# Patient Record
Sex: Male | Born: 1945 | Race: Black or African American | Hispanic: No | Marital: Married | Smoking: Former smoker
Health system: Southern US, Community
[De-identification: ages and names within clinical notes are randomized; demographics above are authoritative.]

## PROBLEM LIST (undated history)

## (undated) DIAGNOSIS — J700 Acute pulmonary manifestations due to radiation: Secondary | ICD-10-CM

## (undated) DIAGNOSIS — I10 Essential (primary) hypertension: Secondary | ICD-10-CM

## (undated) DIAGNOSIS — C3490 Malignant neoplasm of unspecified part of unspecified bronchus or lung: Secondary | ICD-10-CM

## (undated) DIAGNOSIS — I639 Cerebral infarction, unspecified: Secondary | ICD-10-CM

## (undated) HISTORY — DX: Acute pulmonary manifestations due to radiation: J70.0

## (undated) HISTORY — DX: Essential (primary) hypertension: I10

## (undated) HISTORY — PX: CYST EXCISION: SHX5701

---

## 2010-01-13 ENCOUNTER — Ambulatory Visit: Payer: Self-pay | Admitting: Family Medicine

## 2013-12-29 ENCOUNTER — Emergency Department: Payer: Self-pay

## 2013-12-29 LAB — CBC
HCT: 28.3 % — AB (ref 40.0–52.0)
HGB: 9.3 g/dL — AB (ref 13.0–18.0)
MCH: 36.7 pg — ABNORMAL HIGH (ref 26.0–34.0)
MCHC: 32.8 g/dL (ref 32.0–36.0)
MCV: 112 fL — ABNORMAL HIGH (ref 80–100)
PLATELETS: 184 10*3/uL (ref 150–440)
RBC: 2.53 10*6/uL — ABNORMAL LOW (ref 4.40–5.90)
RDW: 16.9 % — ABNORMAL HIGH (ref 11.5–14.5)
WBC: 5.4 10*3/uL (ref 3.8–10.6)

## 2013-12-29 LAB — URINALYSIS, COMPLETE
BACTERIA: NONE SEEN
Bilirubin,UR: NEGATIVE
Blood: NEGATIVE
Glucose,UR: NEGATIVE mg/dL (ref 0–75)
Ketone: NEGATIVE
Leukocyte Esterase: NEGATIVE
Nitrite: NEGATIVE
Ph: 5 (ref 4.5–8.0)
Protein: NEGATIVE
RBC,UR: NONE SEEN /HPF (ref 0–5)
SPECIFIC GRAVITY: 1.014 (ref 1.003–1.030)
Squamous Epithelial: <1
WBC UR: 1 /HPF (ref 0–5)

## 2013-12-29 LAB — BASIC METABOLIC PANEL
ANION GAP: 9 (ref 7–16)
BUN: 5 mg/dL — AB (ref 7–18)
CALCIUM: 8.1 mg/dL — AB (ref 8.5–10.1)
CO2: 20 mmol/L — AB (ref 21–32)
CREATININE: 0.98 mg/dL (ref 0.60–1.30)
Chloride: 109 mmol/L — ABNORMAL HIGH (ref 98–107)
Glucose: 109 mg/dL — ABNORMAL HIGH (ref 65–99)
OSMOLALITY: 274 (ref 275–301)
POTASSIUM: 3.7 mmol/L (ref 3.5–5.1)
Sodium: 138 mmol/L (ref 136–145)

## 2018-03-02 ENCOUNTER — Telehealth: Payer: Self-pay | Admitting: *Deleted

## 2018-03-02 DIAGNOSIS — Z87891 Personal history of nicotine dependence: Secondary | ICD-10-CM

## 2018-03-02 DIAGNOSIS — Z122 Encounter for screening for malignant neoplasm of respiratory organs: Secondary | ICD-10-CM

## 2018-03-02 NOTE — Telephone Encounter (Signed)
Received referral for initial lung cancer screening scan. Contacted patient and obtained smoking history,(current, 58 pack year) as well as answering questions related to screening process. Patient denies signs of lung cancer such as weight loss or hemoptysis. Patient denies comorbidity that would prevent curative treatment if lung cancer were found. Patient is scheduled for shared decision making visit and CT scan on 03/17/18 at 2:15pm.

## 2018-03-17 ENCOUNTER — Ambulatory Visit
Admission: RE | Admit: 2018-03-17 | Discharge: 2018-03-17 | Disposition: A | Discharge status: Home / Self Care | Payer: Medicare PPO | Source: Ambulatory Visit | Attending: Nurse Practitioner | Admitting: Nurse Practitioner

## 2018-03-17 ENCOUNTER — Inpatient Hospital Stay: Payer: Medicare PPO | Attending: Nurse Practitioner | Admitting: Nurse Practitioner

## 2018-03-17 ENCOUNTER — Encounter: Payer: Self-pay | Admitting: Nurse Practitioner

## 2018-03-17 DIAGNOSIS — J90 Pleural effusion, not elsewhere classified: Secondary | ICD-10-CM | POA: Insufficient documentation

## 2018-03-17 DIAGNOSIS — I2510 Atherosclerotic heart disease of native coronary artery without angina pectoris: Secondary | ICD-10-CM | POA: Insufficient documentation

## 2018-03-17 DIAGNOSIS — R918 Other nonspecific abnormal finding of lung field: Secondary | ICD-10-CM | POA: Insufficient documentation

## 2018-03-17 DIAGNOSIS — I700 Atherosclerosis of aorta: Secondary | ICD-10-CM | POA: Diagnosis not present

## 2018-03-17 DIAGNOSIS — C342 Malignant neoplasm of middle lobe, bronchus or lung: Secondary | ICD-10-CM | POA: Insufficient documentation

## 2018-03-17 DIAGNOSIS — Z87891 Personal history of nicotine dependence: Secondary | ICD-10-CM

## 2018-03-17 DIAGNOSIS — J479 Bronchiectasis, uncomplicated: Secondary | ICD-10-CM | POA: Diagnosis not present

## 2018-03-17 DIAGNOSIS — J439 Emphysema, unspecified: Secondary | ICD-10-CM | POA: Diagnosis not present

## 2018-03-17 DIAGNOSIS — F17210 Nicotine dependence, cigarettes, uncomplicated: Secondary | ICD-10-CM | POA: Insufficient documentation

## 2018-03-17 DIAGNOSIS — Z122 Encounter for screening for malignant neoplasm of respiratory organs: Secondary | ICD-10-CM

## 2018-03-17 DIAGNOSIS — I10 Essential (primary) hypertension: Secondary | ICD-10-CM | POA: Insufficient documentation

## 2018-03-17 DIAGNOSIS — R5383 Other fatigue: Secondary | ICD-10-CM | POA: Insufficient documentation

## 2018-03-17 DIAGNOSIS — C771 Secondary and unspecified malignant neoplasm of intrathoracic lymph nodes: Secondary | ICD-10-CM | POA: Insufficient documentation

## 2018-03-17 DIAGNOSIS — Z79899 Other long term (current) drug therapy: Secondary | ICD-10-CM | POA: Insufficient documentation

## 2018-03-17 NOTE — Progress Notes (Signed)
In accordance with CMS guidelines, patient has met eligibility criteria including age, absence of signs or symptoms of lung cancer.  Social History   Tobacco Use  . Smoking status: Current Some Day Smoker    Packs/day: 1.00    Years: 58.00    Pack years: 58.00    Types: Cigarettes  Substance Use Topics  . Alcohol use: Not on file  . Drug use: Not on file      A shared decision-making session was conducted prior to the performance of CT scan. This includes one or more decision aids, includes benefits and harms of screening, follow-up diagnostic testing, over-diagnosis, false positive rate, and total radiation exposure.   Counseling on the importance of adherence to annual lung cancer LDCT screening, impact of co-morbidities, and ability or willingness to undergo diagnosis and treatment is imperative for compliance of the program.   Counseling on the importance of continued smoking cessation for former smokers; the importance of smoking cessation for current smokers, and information about tobacco cessation interventions have been given to patient including [place] and [facility] quit [place] programs.   Written order for lung cancer screening with LDCT has been given to the patient and any and all questions have been answered to the best of my abilities.    Yearly follow up will be coordinated by Burgess Estelle, Thoracic Navigator.  Beckey Rutter, DNP, AGNP-C [place] at [facility] [phone] (work cell) [phone] (office) 03/17/18 3:48 PM

## 2018-03-18 ENCOUNTER — Encounter: Payer: Self-pay | Admitting: *Deleted

## 2018-03-18 ENCOUNTER — Encounter: Payer: Self-pay | Admitting: Oncology

## 2018-03-18 ENCOUNTER — Telehealth: Payer: Self-pay | Admitting: *Deleted

## 2018-03-18 DIAGNOSIS — R918 Other nonspecific abnormal finding of lung field: Secondary | ICD-10-CM

## 2018-03-18 NOTE — Progress Notes (Signed)
I will be seeing this patient on 03/25/2018 for evaluation of right middle lobe lung mass and hilar adenopathy noted on screening low-dose CT scan of the lung.  I have reviewed the images.  Patient will need a PET CT scan for further evaluation and decision regarding biopsy.  Patient will also benefit from pulmonary consultation for probable bronchoscopy.   Dr. Randa Evens, MD, MPH [place] at [facility] [phone] 03/18/2018 2:46 PM

## 2018-03-18 NOTE — Telephone Encounter (Signed)
After discussion with PCP office and thoracic nurse navigator, contacted patient's wife and reviewed results with plan for PET scan, pulmonary for biopsy, and medical oncology appointments. Wife verbalizes understanding.   IMPRESSION: 1. Lung-RADS 4B, suspicious. Additional imaging evaluation or consultation with Pulmonology or Thoracic Surgery recommended. 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 A component of the right middle lobe process is likely postobstructive pneumonitis. Consider tissue sampling via bronchoscopy versus PET to direct tissue sampling. 2. Aortic atherosclerosis (ICD10-I70.0), coronary artery atherosclerosis and emphysema (ICD10-J43.9). 3. Suspicion of interstitial lung disease, as evidenced by architectural distortion and traction bronchiectasis in the upper lobes, worse on the right. 4. Esophageal air fluid level suggests dysmotility or gastroesophageal reflux. 5. Small right pleural effusion.

## 2018-03-18 NOTE — Progress Notes (Signed)
  Oncology Nurse Navigator Documentation  Navigator Location: [facility]-Med Onc (03/18/18 1400) Referral date to RadOnc/MedOnc: 03/18/18 (03/18/18 1400) )Navigator Encounter Type: Other (03/18/18 1400)   Abnormal Finding Date: 03/18/18 (03/18/18 1400)                 Patient Visit Type: Other (03/18/18 1400) Treatment Phase: Abnormal Scans (03/18/18 1400) Barriers/Navigation Needs: Coordination of Care (03/18/18 1400)   Interventions: Coordination of Care (03/18/18 1400)   Coordination of Care: Appts;Radiology (03/18/18 1400)        Acuity: Level 2 (03/18/18 1400)   Acuity Level 2: Initial guidance, education and coordination as needed;Educational needs;Assistance expediting appointments (03/18/18 1400)  referral received from lung cancer screening program for lung mass with mediastinal/hilar nodal mets based on CT results. Spoke with Dr. Janese Banks who advised pt be set up with PET scan and to follow up with med-onc and pulmonary after PET scan. Pt has been made aware of CT results and will be contacted once all appts scheduled.    Time Spent with Patient: 30 (03/18/18 1400)

## 2018-03-22 ENCOUNTER — Telehealth: Payer: Self-pay | Admitting: *Deleted

## 2018-03-22 NOTE — Addendum Note (Signed)
Addended by: Telford Nab on: 03/22/2018 11:53 AM   Modules accepted: Orders

## 2018-03-22 NOTE — Telephone Encounter (Signed)
Spoke with patient's wife and reviewed all upcoming appts. Contact info given and instructed to call with any further questions.

## 2018-03-22 NOTE — Telephone Encounter (Signed)
Multiple attempts have been made to discuss scheduled appts for PET scan and consultation in lung clinic Friday morning. Multiple messages left for pt to return call to discuss appts. At this time, pt has not returned any phone call. Will continue to contact pt.

## 2018-03-23 ENCOUNTER — Encounter
Admission: RE | Admit: 2018-03-23 | Discharge: 2018-03-23 | Disposition: A | Discharge status: Home / Self Care | Payer: Medicare PPO | Source: Ambulatory Visit | Attending: Oncology | Admitting: Oncology

## 2018-03-23 DIAGNOSIS — R918 Other nonspecific abnormal finding of lung field: Secondary | ICD-10-CM | POA: Insufficient documentation

## 2018-03-23 LAB — GLUCOSE, CAPILLARY: GLUCOSE-CAPILLARY: 113 mg/dL — AB (ref 70–99)

## 2018-03-23 MED ORDER — FLUDEOXYGLUCOSE F - 18 (FDG) INJECTION
9.2300 | Freq: Once | INTRAVENOUS | Status: AC | PRN
  Administered 2018-03-23: 9.23 via INTRAVENOUS

## 2018-03-25 ENCOUNTER — Encounter: Payer: Self-pay | Admitting: *Deleted

## 2018-03-25 ENCOUNTER — Encounter: Payer: Self-pay | Admitting: Pulmonary Disease

## 2018-03-25 ENCOUNTER — Telehealth: Payer: Self-pay

## 2018-03-25 ENCOUNTER — Encounter: Payer: Self-pay | Admitting: Oncology

## 2018-03-25 ENCOUNTER — Ambulatory Visit (INDEPENDENT_AMBULATORY_CARE_PROVIDER_SITE_OTHER): Payer: Medicare PPO | Admitting: Pulmonary Disease

## 2018-03-25 ENCOUNTER — Inpatient Hospital Stay (HOSPITAL_BASED_OUTPATIENT_CLINIC_OR_DEPARTMENT_OTHER): Payer: Medicare PPO | Admitting: Oncology

## 2018-03-25 ENCOUNTER — Inpatient Hospital Stay: Payer: Medicare PPO

## 2018-03-25 VITALS — BP 126/77 | HR 96 | Temp 97.3°F | Resp 18 | Ht 72.0 in | Wt 155.2 lb

## 2018-03-25 DIAGNOSIS — R5383 Other fatigue: Secondary | ICD-10-CM

## 2018-03-25 DIAGNOSIS — I10 Essential (primary) hypertension: Secondary | ICD-10-CM | POA: Diagnosis not present

## 2018-03-25 DIAGNOSIS — F17210 Nicotine dependence, cigarettes, uncomplicated: Secondary | ICD-10-CM

## 2018-03-25 DIAGNOSIS — Z7189 Other specified counseling: Secondary | ICD-10-CM

## 2018-03-25 DIAGNOSIS — C771 Secondary and unspecified malignant neoplasm of intrathoracic lymph nodes: Secondary | ICD-10-CM | POA: Diagnosis not present

## 2018-03-25 DIAGNOSIS — Z79899 Other long term (current) drug therapy: Secondary | ICD-10-CM

## 2018-03-25 DIAGNOSIS — R590 Localized enlarged lymph nodes: Secondary | ICD-10-CM

## 2018-03-25 DIAGNOSIS — R918 Other nonspecific abnormal finding of lung field: Secondary | ICD-10-CM

## 2018-03-25 DIAGNOSIS — C342 Malignant neoplasm of middle lobe, bronchus or lung: Secondary | ICD-10-CM | POA: Diagnosis present

## 2018-03-25 LAB — CBC WITH DIFFERENTIAL/PLATELET
Abs Immature Granulocytes: 0.01 10*3/uL (ref 0.00–0.07)
BASOS ABS: 0 10*3/uL (ref 0.0–0.1)
Basophils Relative: 1 %
EOS PCT: 3 %
Eosinophils Absolute: 0.2 10*3/uL (ref 0.0–0.5)
HEMATOCRIT: 37.9 % — AB (ref 39.0–52.0)
HEMOGLOBIN: 12.4 g/dL — AB (ref 13.0–17.0)
Immature Granulocytes: 0 %
LYMPHS ABS: 1.8 10*3/uL (ref 0.7–4.0)
LYMPHS PCT: 34 %
MCH: 32.2 pg (ref 26.0–34.0)
MCHC: 32.7 g/dL (ref 30.0–36.0)
MCV: 98.4 fL (ref 80.0–100.0)
MONO ABS: 0.7 10*3/uL (ref 0.1–1.0)
MONOS PCT: 13 %
NRBC: 0 % (ref 0.0–0.2)
Neutro Abs: 2.6 10*3/uL (ref 1.7–7.7)
Neutrophils Relative %: 49 %
Platelets: 326 10*3/uL (ref 150–400)
RBC: 3.85 MIL/uL — ABNORMAL LOW (ref 4.22–5.81)
RDW: 13 % (ref 11.5–15.5)
WBC: 5.2 10*3/uL (ref 4.0–10.5)

## 2018-03-25 LAB — COMPREHENSIVE METABOLIC PANEL
ALBUMIN: 3.3 g/dL — AB (ref 3.5–5.0)
ALT: 11 U/L (ref 0–44)
AST: 18 U/L (ref 15–41)
Alkaline Phosphatase: 88 U/L (ref 38–126)
Anion gap: 8 (ref 5–15)
BUN: 10 mg/dL (ref 8–23)
CO2: 27 mmol/L (ref 22–32)
CREATININE: 0.89 mg/dL (ref 0.61–1.24)
Calcium: 9.6 mg/dL (ref 8.9–10.3)
Chloride: 100 mmol/L (ref 98–111)
GFR calc Af Amer: >60 mL/min (ref >60)
GFR calc non Af Amer: >60 mL/min (ref >60)
GLUCOSE: 99 mg/dL (ref 70–99)
Potassium: 4.3 mmol/L (ref 3.5–5.1)
SODIUM: 135 mmol/L (ref 135–145)
Total Bilirubin: 0.5 mg/dL (ref 0.3–1.2)
Total Protein: 8 g/dL (ref 6.5–8.1)

## 2018-03-25 LAB — PROTIME-INR
INR: 1.09
Prothrombin Time: 14 seconds (ref 11.4–15.2)

## 2018-03-25 LAB — APTT: aPTT: 31 seconds (ref 24–36)

## 2018-03-25 NOTE — Progress Notes (Signed)
The patient was seen at the cancer center during multidisciplinary Lung Cancer clinic.  Subjective:    Patient ID: Philip Weiss, male    DOB: 1945/09/07, 72 y.o.   MRN: [id number]  HPI The patient is a 72 year old current smoker, who presented to the cancer center today after having had an abnormal CT chest for lung cancer screening on 11 October. We are asked to evaluate the patient for potential bronchoscopy. The patient is kindly referred by Dr. Janese Banks. The patient does not endorse any symptoms of weight loss, anorexia, chest pain, or hemoptysis. He had had a nonproductive cough which prompted him to present to the [facility] for evaluation. After that visit, the patient had a low dose CT scan of the chest performed. This was for lung cancer screening. The chest CT has shown a right middle lobe mass and significant mediastinal and right hilar adenopathy as well as a dense sub coronal mass. These films were individually reviewed. I have also reviewed the films with the patient and his wife. The patient also had a PET/CT this was performed on 16 October. PET/CT reveals that the findings noted above have significant FDG uptake.  The patient has noted does not endorse any associated symptoms except for the nonproductive cough with regards to these findings.  We discussed workup of these findings and the patient understands the potential methods for biopsy. Percutaneous needle biopsy as well as bronchoscopy within the bronchial ultrasound were presented to the patient. The patient elected to go with bronchoscopy within the bronchial ultrasound. He understands the potential benefits, limitations and complications of this procedure to include pneumothorax, bleeding, infection to name a few. The patient also understands the procedure will be performed under general anesthesia.  The patient's past medical history, family history, social history and surgical history have been reviewed.  He has no military  background. He has worked in [employer]. He has since retired.  Review of Systems  Constitutional: Negative.   HENT: Negative.   Eyes: Negative.   Respiratory: Positive for cough (Non productive).   Cardiovascular: Negative.   Gastrointestinal: Negative.   Endocrine: Negative.   Genitourinary: Negative.   Musculoskeletal: Negative.   Skin: Negative.   Allergic/Immunologic: Negative.   Neurological: Negative.   Hematological: Negative.   Psychiatric/Behavioral: Negative.        Objective:   Physical Exam  Constitutional: He is oriented to person, place, and time. He appears well-developed and well-nourished. No distress.  HENT:  Head: Normocephalic and atraumatic.  Right Ear: External ear normal.  Left Ear: External ear normal.  Nose: Nose normal.  Mouth/Throat: Oropharynx is clear and moist.  Poor dentition, Mallampati Class I.  Eyes: Pupils are equal, round, and reactive to light. Conjunctivae and EOM are normal. No scleral icterus.  Neck: Normal range of motion. Neck supple. No JVD present. No tracheal deviation present. No thyromegaly present.  Cardiovascular: Normal rate, regular rhythm, normal heart sounds and intact distal pulses. Exam reveals no gallop and no friction rub.  No murmur heard. Pulmonary/Chest: Effort normal. No stridor. No respiratory distress. He has no wheezes. He has no rales. He exhibits no tenderness.  Coarse breath sounds  Abdominal: Soft. Bowel sounds are normal. He exhibits no distension.  Musculoskeletal: Normal range of motion. He exhibits no deformity.  Lymphadenopathy:    He has no cervical adenopathy.  Neurological: He is alert and oriented to person, place, and time.  No focal deficits noted  Skin: Skin is warm and dry. No  rash noted. No erythema. No pallor.  Psychiatric: He has a normal mood and affect. Thought content normal.  Nursing note and vitals reviewed.   Patient's imaging has been reviewed in detail. I have also reviewed the  images with the patient and his wife. The patient will need update on laboratory data particularly since he is to undergo general anesthesia.      Assessment & Plan:   1) Right lower lobe lung mass: carcinoma until proven otherwise. The patient will need definitive diagnosis. As can be achieved with bronchoscopy will biopsies of the right lower lobe under fluoroscopy. In addition, as noted below the patient has significant mediastinal adenopathy and this will be evaluated in the same setting as below.  2) Mediastinal adenopathy: the patient has bulking mediastinal adenopathy/mass, this is likely related to his primary process. This can be assessed at the time of bronchoscopy within the bronchial ultrasound and entrance bronchial needle aspirate (TBNA). The patient understands of the procedure will be performed under general anesthesia. He understands the potential benefits, limitations and complications of the procedure and agrees to go ahead.  3) Tobacco dependence due to cigarettes: the patient was counseled regards to discontinuation of smoking average33 minutes counseling time.   Patient has been scheduled for bronchoscopy with right lower lobe biopsy under fluoroscopy and in the bronchial ultrasound with mediastinal node/mass sampling for 22 October.    Thank you for allowing me to participate in this patient's care.

## 2018-03-25 NOTE — Telephone Encounter (Signed)
Surgical Procedure: Bronchoscopy and EBUS DX: Right lower lobe mass, subcarinal mass  CPT Code: 41740, 81448, 18563 Sx date: 03/29/18 @ 10:00 pending confirmation

## 2018-03-25 NOTE — Telephone Encounter (Signed)
Called [employer] and spoke with Lisabeth Devoid at 11:38 am EST. Procedure codes [phone], [phone] and [phone] does not require PA, valid and billable and no referral is needed. Call Ref # [id number]. Rhonda J Cobb

## 2018-03-25 NOTE — Patient Instructions (Signed)
Patient was provided instructions at his oncology visit.

## 2018-03-28 ENCOUNTER — Encounter
Admission: RE | Admit: 2018-03-28 | Discharge: 2018-03-28 | Disposition: A | Discharge status: Home / Self Care | Payer: Medicare PPO | Source: Ambulatory Visit | Attending: Pulmonary Disease | Admitting: Pulmonary Disease

## 2018-03-28 ENCOUNTER — Encounter: Payer: Self-pay | Admitting: Oncology

## 2018-03-28 ENCOUNTER — Other Ambulatory Visit: Payer: Self-pay

## 2018-03-28 DIAGNOSIS — I10 Essential (primary) hypertension: Secondary | ICD-10-CM | POA: Insufficient documentation

## 2018-03-28 DIAGNOSIS — I491 Atrial premature depolarization: Secondary | ICD-10-CM

## 2018-03-28 DIAGNOSIS — R918 Other nonspecific abnormal finding of lung field: Secondary | ICD-10-CM | POA: Insufficient documentation

## 2018-03-28 DIAGNOSIS — F17210 Nicotine dependence, cigarettes, uncomplicated: Secondary | ICD-10-CM | POA: Diagnosis not present

## 2018-03-28 DIAGNOSIS — R599 Enlarged lymph nodes, unspecified: Secondary | ICD-10-CM | POA: Diagnosis not present

## 2018-03-28 DIAGNOSIS — Z79899 Other long term (current) drug therapy: Secondary | ICD-10-CM | POA: Diagnosis not present

## 2018-03-28 DIAGNOSIS — C342 Malignant neoplasm of middle lobe, bronchus or lung: Secondary | ICD-10-CM | POA: Diagnosis not present

## 2018-03-28 DIAGNOSIS — C7989 Secondary malignant neoplasm of other specified sites: Secondary | ICD-10-CM | POA: Diagnosis not present

## 2018-03-28 DIAGNOSIS — Z01818 Encounter for other preprocedural examination: Secondary | ICD-10-CM

## 2018-03-28 NOTE — Progress Notes (Signed)
  Oncology Nurse Navigator Documentation  Navigator Location: [facility]-Med Onc (03/25/18 1100)   )Navigator Encounter Type: Clinic/MDC (03/25/18 1100)               Multidisiplinary Clinic Date: 03/25/18 (03/25/18 1100) Multidisiplinary Clinic Type: Thoracic (03/25/18 1100)   Patient Visit Type: MedOnc;Other (03/25/18 1100)   Barriers/Navigation Needs: Coordination of Care (03/25/18 1100)   Interventions: Coordination of Care (03/25/18 1100)   Coordination of Care: Appts;Broncoscopy;Radiology (03/25/18 1100)         met with patient and his wife during consultation in thoracic [place] this morning. All questions answered at the time of visit. Reviewed upcoming appts with patient. Informed that will be notified by phone when to follow up with Dr. Janese Banks and Dr. Baruch Gouty after bronchoscopy. Pt informed that will try to coordinate brain MRI on same day as well. Contact info given and instructed to call with any further questions or needs. Pt verbalized understanding.         Time Spent with Patient: 120 (03/25/18 1100)

## 2018-03-28 NOTE — Patient Instructions (Signed)
Your procedure is scheduled on: Tuesday, March 29, 2018 Report to Day Surgery on the 2nd floor of the [facility]. To find out your arrival time, please call [phone] between 1PM - 3PM on: today, October 21  REMEMBER: Instructions that are not followed completely may result in serious medical risk, up to and including death; or upon the discretion of your surgeon and anesthesiologist your surgery may need to be rescheduled.  Do not eat food after midnight the night before surgery.  No gum chewing, lozengers or hard candies.  You may however, drink CLEAR liquids up to 2 hours before you are scheduled to arrive for your surgery. Do not drink anything within 2 hours of the start of your surgery.  Clear liquids include: - water  - apple juice without pulp - gatorade - black coffee or tea (Do NOT add milk or creamers to the coffee or tea) Do NOT drink anything that is not on this list.  No Alcohol for 24 hours before or after surgery.  No Smoking including e-cigarettes for 24 hours prior to surgery.  No chewable tobacco products for at least 6 hours prior to surgery.  No nicotine patches on the day of surgery.  On the morning of surgery brush your teeth with toothpaste and water, you may rinse your mouth with mouthwash if you wish. Do not swallow any toothpaste or mouthwash.  Notify your doctor if there is any change in your medical condition (cold, fever, infection).  Do not wear jewelry, make-up, hairpins, clips or nail polish.  Do not wear lotions, powders, or perfumes.   Do not shave 48 hours prior to surgery. Men may shave face and neck.  Contacts and dentures may not be worn into surgery.  Do not bring valuables to the hospital, including drivers license, insurance or credit cards.  [facility] is not responsible for any belongings or valuables.   TAKE THESE MEDICATIONS THE MORNING OF SURGERY:  1.  symbicort inhaler  NOW!  Stop Anti-inflammatories (NSAIDS) such  as Advil, Aleve, Ibuprofen, Motrin, Naproxen, Naprosyn and Aspirin based products such as Excedrin, Goodys Powder, BC Powder. (May take Tylenol or Acetaminophen if needed.)  NOW!  Stop ANY OVER THE COUNTER supplements until after surgery. (May continue Vitamin B, and multivitamin.)  If you are being discharged the day of surgery, you will not be allowed to drive home. You will need a responsible adult to drive you home and stay with you that night.   If you are taking public transportation, you will need to have a responsible adult with you. Please confirm with your physician that it is acceptable to use public transportation.   Please call [phone] if you have any questions about these instructions.

## 2018-03-28 NOTE — Progress Notes (Signed)
Hematology/Oncology Consult note [facility] Telephone:([phone] [phone]  Patient Care Team: [facility] as PCP - General ([place]) Telford Nab, RN as Registered Nurse   Name of the patient: Philip Weiss  [id number]  14-Mar-1946    Reason for consult; new lung mass   Requesting physician: [facility]  Date of visit: 03/28/18   History of presenting illness-patient is a 72 year old male with a long-standing history of smoking and is a current smoker with a 58-pack-year smoking history.  He underwent lung cancer screening low-dose CT scan which showed a right middle lobe lung mass of about 4.8 cm.  Subcarinal adenopathy 2.8 x 4 cm and probable right hilar adenopathy and soft tissue fullness 2.3 cm.  This was followed by a PET CT scan which showed right middle lobe mass was hypermetabolic at 02.6 with hypermetabolic right hilar and infrahilar adenopathy.  Hypermetabolic 2.5 cm subcarinal node and right paratracheal nodes.  No hypermetabolic contralateral adenopathy.  Left upper lobe 4 mm solitary pulmonary nodule below PET resolution.  No evidence of hypermetabolic them elsewhere.  Patient lives at home with his wife in [place].  He reports feeling fatigued but denies any other specific complaints.  His appetite is good and he denies any unintentional weight loss.  Denies any aches or pains anywhere.  He does have some chronic nonproductive cough.  ECOG PS- 1  Pain scale- 0   Review of systems- Review of Systems  Constitutional: Negative for chills, fever, malaise/fatigue and weight loss.  HENT: Negative for congestion, ear discharge and nosebleeds.   Eyes: Negative for blurred vision.  Respiratory: Positive for cough. Negative for hemoptysis, sputum production, shortness of breath and wheezing.   Cardiovascular: Negative for chest pain, palpitations, orthopnea and claudication.  Gastrointestinal:  Negative for abdominal pain, blood in stool, constipation, diarrhea, heartburn, melena, nausea and vomiting.  Genitourinary: Negative for dysuria, flank pain, frequency, hematuria and urgency.  Musculoskeletal: Negative for back pain, joint pain and myalgias.  Skin: Negative for rash.  Neurological: Negative for dizziness, tingling, focal weakness, seizures, weakness and headaches.  Endo/Heme/Allergies: Does not bruise/bleed easily.  Psychiatric/Behavioral: Negative for depression and suicidal ideas. The patient does not have insomnia.     No Known Allergies  There are no active problems to display for this patient.    Past Medical History:  Diagnosis Date  . Hypertension      Past Surgical History:  Procedure Laterality Date  . CYST EXCISION Left    buttock    Social History   Socioeconomic History  . Marital status: Married    Spouse name: Not on file  . Number of children: Not on file  . Years of education: Not on file  . Highest education level: Not on file  Occupational History  . Not on file  Social Needs  . Financial resource strain: Not on file  . Food insecurity:    Worry: Not on file    Inability: Not on file  . Transportation needs:    Medical: Not on file    Non-medical: Not on file  Tobacco Use  . Smoking status: Current Every Day Smoker    Packs/day: 1.00    Years: 58.00    Pack years: 58.00    Types: Cigarettes  . Smokeless tobacco: Never Used  Substance and Sexual Activity  . Alcohol use: Yes    Comment: daily 32 oz beer  . Drug use: Never  . Sexual activity: Not  on file  Lifestyle  . Physical activity:    Days per week: Not on file    Minutes per session: Not on file  . Stress: Not on file  Relationships  . Social connections:    Talks on phone: Not on file    Gets together: Not on file    Attends religious service: Not on file    Active member of club or organization: Not on file    Attends meetings of clubs or organizations: Not  on file    Relationship status: Not on file  . Intimate partner violence:    Fear of current or ex partner: Not on file    Emotionally abused: Not on file    Physically abused: Not on file    Forced sexual activity: Not on file  Other Topics Concern  . Not on file  Social History Narrative  . Not on file     Family History  Problem Relation Age of Onset  . Diabetes Mother   . Stroke Father      Current Outpatient Medications:  .  budesonide-formoterol (SYMBICORT) 160-4.5 MCG/ACT inhaler, Inhale 2 puffs into the lungs 2 (two) times daily., Disp: , Rfl:  .  Cyanocobalamin (VITAMIN B-12 PO), Take 1 tablet by mouth daily., Disp: , Rfl:  .  losartan (COZAAR) 25 MG tablet, Take 25 mg by mouth daily. , Disp: , Rfl:  .  Multiple Vitamins-Minerals (MULTIVITAMIN PO), Take 1 tablet by mouth daily., Disp: , Rfl:  .  Pyridoxine HCl (VITAMIN B-6 PO), Take 1 tablet by mouth daily., Disp: , Rfl:    Physical exam:  Vitals:   03/25/18 0859  BP: 126/77  Pulse: 96  Resp: 18  Temp: (!) 97.3 F (36.3 C)  TempSrc: Tympanic  SpO2: 97%  Weight: 155 lb 3.2 oz (70.4 kg)  Height: 6' (1.829 m)   Physical Exam  Constitutional: He is oriented to person, place, and time.  Thin gentleman in no acute distress  HENT:  Head: Normocephalic and atraumatic.  Eyes: Pupils are equal, round, and reactive to light. EOM are normal.  Neck: Normal range of motion.  Cardiovascular: Normal rate, regular rhythm and normal heart sounds.  Pulmonary/Chest: Effort normal and breath sounds normal.  Abdominal: Soft. Bowel sounds are normal.  Neurological: He is alert and oriented to person, place, and time.  Skin: Skin is warm and dry.       CMP Latest Ref Rng & Units 03/25/2018  Glucose 70 - 99 mg/dL 99  BUN 8 - 23 mg/dL 10  Creatinine 0.61 - 1.24 mg/dL 0.89  Sodium 135 - 145 mmol/L 135  Potassium 3.5 - 5.1 mmol/L 4.3  Chloride 98 - 111 mmol/L 100  CO2 22 - 32 mmol/L 27  Calcium 8.9 - 10.3 mg/dL 9.6    Total Protein 6.5 - 8.1 g/dL 8.0  Total Bilirubin 0.3 - 1.2 mg/dL 0.5  Alkaline Phos 38 - 126 U/L 88  AST 15 - 41 U/L 18  ALT 0 - 44 U/L 11   CBC Latest Ref Rng & Units 03/25/2018  WBC 4.0 - 10.5 K/uL 5.2  Hemoglobin 13.0 - 17.0 g/dL 12.4(L)  Hematocrit 39.0 - 52.0 % 37.9(L)  Platelets 150 - 400 K/uL 326    No images are attached to the encounter.  Nm Pet Image Initial (pi) Skull Base To Thigh  Result Date: 03/23/2018 CLINICAL DATA:  Initial treatment strategy for right lung mass on screening chest CT. EXAM: NUCLEAR MEDICINE PET SKULL  BASE TO THIGH TECHNIQUE: 9.2 mCi F-18 FDG was injected intravenously. Full-ring PET imaging was performed from the skull base to thigh after the radiotracer. CT data was obtained and used for attenuation correction and anatomic localization. Fasting blood glucose: 113 mg/dl COMPARISON:  03/17/2018 screening chest CT. FINDINGS: Mediastinal blood pool activity: SUV max 2.8 NECK: No hypermetabolic lymph nodes in the neck. Incidental CT findings: none CHEST: Right middle lobe 4.9 x 3.9 cm mass (series 4/image 117) is intensely hypermetabolic with max SUV 16.1. There is postobstructive pneumonia/atelectasis in the right middle lobe peripheral to the mass. There is hypermetabolic right hilar/infrahilar adenopathy with representative max SUV 16.4 in the right infrahilar region. Hypermetabolic 2.5 cm right subcarinal node with max SUV 16.0. Mildly hypermetabolic right paratracheal nodes, largest 1.0 cm with max SUV 4.0 (series 4/image 80). No hypermetabolic contralateral mediastinal, contralateral hilar or supraclavicular lymph nodes. Incidental CT findings: Left upper lobe 4 mm solid pulmonary nodule is below PET resolution (series 4/image 103). No additional significant pulmonary nodules. Trace dependent right pleural effusion. Coronary atherosclerosis. Atherosclerotic thoracic aorta with ectatic 4.2 cm ascending thoracic aorta. Mild centrilobular and paraseptal  emphysema. ABDOMEN/PELVIS: No abnormal hypermetabolic activity within the liver, pancreas, adrenal glands, or spleen. No hypermetabolic lymph nodes in the abdomen or pelvis. Incidental CT findings: Atherosclerotic nonaneurysmal abdominal aorta. Mildly enlarged prostate. SKELETON: No focal hypermetabolic activity to suggest skeletal metastasis. Incidental CT findings: none IMPRESSION: 1. Intensely hypermetabolic 4.9 cm right middle lobe lung mass, compatible with primary bronchogenic carcinoma. Postobstructive pneumonia/atelectasis in the right middle lobe. 2. Hypermetabolic ipsilateral hilar, subcarinal and ipsilateral paratracheal adenopathy. 3. No distant hypermetabolic metastatic disease. 4. PET-CT staging is T2b N2 M0, stage IIIa. 5. Trace dependent right pleural effusion. 6. Solid 0.4 cm left upper lobe pulmonary nodule is below PET resolution and indeterminate. Follow-up chest CT advised in 3 months. 7. Ectatic 4.2 cm ascending thoracic aorta. Recommend annual imaging followup by CTA or MRA. This recommendation follows 2010 ACCF/AHA/AATS/ACR/ASA/SCA/SCAI/SIR/STS/SVM Guidelines for the Diagnosis and Management of Patients with Thoracic Aortic Disease. Circulation. 2010; 121: W960-A540. 8. Aortic Atherosclerosis (ICD10-I70.0) and Emphysema (ICD10-J43.9). Electronically Signed   By: Ilona Sorrel M.D.   On: 03/23/2018 15:35   Ct Chest Lung Cancer Screening Low Dose Wo Contrast  Result Date: 03/18/2018 CLINICAL DATA:  Asymptomatic current smoker, greater than 30 pack years. One pack-per-day for 58 years. EXAM: CT CHEST WITHOUT CONTRAST LOW-DOSE FOR LUNG CANCER SCREENING TECHNIQUE: Multidetector CT imaging of the chest was performed following the standard protocol without IV contrast. COMPARISON:  None. FINDINGS: Cardiovascular: Aortic atherosclerosis. Tortuous thoracic aorta. Mild cardiomegaly, without pericardial effusion. Lad or branch coronary artery atherosclerosis. Mediastinum/Nodes: No supraclavicular  adenopathy. Subcarinal adenopathy, on the order of 2.8 x 4.0 cm on image 35/2. Probable right hilar adenopathy, with soft tissue fullness on the order of 2.3 cm on image 39/2. This is suboptimally evaluated on this noncontrast exam (difficult to differentiate from pulmonary artery branches). Fluid level in the esophagus. Lungs/Pleura: Small right pleural effusion. Mass-effect upon right middle and less so right lower lobe bronchi. Moderate centrilobular and mild paraseptal emphysema. Probable right greater than left upper lobe traction bronchiectasis. Right middle lobe central mass with surrounding interstitial thickening and ground-glass. This measures on the order of volume derived equivalent diameter 4.8 Cm. Example on image 238/3. Left-sided pulmonary nodules measure maximally volume derived equivalent diameter 3.8 mm. Upper Abdomen: Normal imaged portions of the liver, spleen, stomach, pancreas, adrenal glands, kidneys. Musculoskeletal: Lower cervical spondylosis. IMPRESSION: 1. Lung-RADS 4B, suspicious. Additional  imaging evaluation or consultation with Pulmonology or Thoracic Surgery recommended. 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 A component of the right middle lobe process is likely postobstructive pneumonitis. Consider tissue sampling via bronchoscopy versus PET to direct tissue sampling. 2. Aortic atherosclerosis (ICD10-I70.0), coronary artery atherosclerosis and emphysema (ICD10-J43.9). 3. Suspicion of interstitial lung disease, as evidenced by architectural distortion and traction bronchiectasis in the upper lobes, worse on the right. 4. Esophageal air fluid level suggests dysmotility or gastroesophageal reflux. 5. Small right pleural effusion. These results will be called to the ordering clinician or representative by the Radiologist Assistant, and communication documented in the PACS or zVision Dashboard.  Electronically Signed   By: Abigail Miyamoto M.D.   On: 03/18/2018 08:53    Assessment and plan- Patient is a 72 y.o. male with newly diagnosed right middle lobe lung mass and mediastinal adenopathy concerning for lung cancer  I have reviewed CT chest as well as PET/CT images independently and discussed findings with the patient.  Patient has been found to have right middle lobe lung mass of about 4.9 cm along with ipsilateral mediastinal adenopathy.  There is no evidence of metastatic disease elsewhere and this likely represents stage III lung cancer.  We need tissue diagnosis which will be obtained with the help of bronchoscopy and he has met with Dr. Patsey Berthold for the same.  He will need MRI brain with and without contrast to complete his staging work-up.  Reviewed briefly the history and etiology of lung cancer and management of different stages of lung cancer.  Based on PET he has stage III lung cancer which will likely need concurrent chemoradiation.  What type of chemotherapy he needs will depend on the results of final biopsy.  Once biopsy results of match he will also need port placement and chemotherapy class.  I will plan to see him tentatively in 10 days time after his bronchoscopy to discuss biopsy results and further management.  He will also see radiation oncology on the same day treatment will be given with a curative intent.   Thank you for this kind referral and the opportunity to participate in the care of this patient   Visit Diagnosis 1. Lung mass   2. Goals of care, counseling/discussion     Dr. Randa Evens, MD, MPH [facility] [phone] 03/28/2018  8:59 AM

## 2018-03-28 NOTE — Pre-Procedure Instructions (Signed)
EKG NOTED.

## 2018-03-29 ENCOUNTER — Ambulatory Visit: Payer: Medicare PPO | Admitting: Anesthesiology

## 2018-03-29 ENCOUNTER — Ambulatory Visit
Admission: RE | Admit: 2018-03-29 | Discharge: 2018-03-29 | Disposition: A | Discharge status: Home / Self Care | Payer: Medicare PPO | Source: Ambulatory Visit | Attending: Pulmonary Disease | Admitting: Pulmonary Disease

## 2018-03-29 ENCOUNTER — Encounter
Admission: RE | Disposition: A | Discharge status: Home / Self Care | Payer: Self-pay | Source: Ambulatory Visit | Attending: Pulmonary Disease

## 2018-03-29 ENCOUNTER — Ambulatory Visit: Payer: Medicare PPO

## 2018-03-29 DIAGNOSIS — C7989 Secondary malignant neoplasm of other specified sites: Secondary | ICD-10-CM | POA: Insufficient documentation

## 2018-03-29 DIAGNOSIS — C342 Malignant neoplasm of middle lobe, bronchus or lung: Secondary | ICD-10-CM | POA: Insufficient documentation

## 2018-03-29 DIAGNOSIS — F17210 Nicotine dependence, cigarettes, uncomplicated: Secondary | ICD-10-CM | POA: Insufficient documentation

## 2018-03-29 DIAGNOSIS — I10 Essential (primary) hypertension: Secondary | ICD-10-CM | POA: Insufficient documentation

## 2018-03-29 DIAGNOSIS — R599 Enlarged lymph nodes, unspecified: Secondary | ICD-10-CM | POA: Insufficient documentation

## 2018-03-29 DIAGNOSIS — R918 Other nonspecific abnormal finding of lung field: Secondary | ICD-10-CM | POA: Diagnosis not present

## 2018-03-29 DIAGNOSIS — J9859 Other diseases of mediastinum, not elsewhere classified: Secondary | ICD-10-CM | POA: Diagnosis not present

## 2018-03-29 DIAGNOSIS — Z79899 Other long term (current) drug therapy: Secondary | ICD-10-CM | POA: Insufficient documentation

## 2018-03-29 HISTORY — PX: ENDOBRONCHIAL ULTRASOUND: SHX5096

## 2018-03-29 HISTORY — PX: FLEXIBLE BRONCHOSCOPY: SHX5094

## 2018-03-29 SURGERY — ENDOBRONCHIAL ULTRASOUND (EBUS)
Anesthesia: General | Laterality: Right

## 2018-03-29 MED ORDER — LACTATED RINGERS IV SOLN
INTRAVENOUS | Status: DC
  Administered 2018-03-29: 10:00:00 via INTRAVENOUS

## 2018-03-29 MED ORDER — OXYCODONE HCL 5 MG PO TABS: 5.0000 mg | ORAL_TABLET | Freq: Once | ORAL | Status: DC | PRN

## 2018-03-29 MED ORDER — PROMETHAZINE HCL 25 MG/ML IJ SOLN: 6.2500 mg | INTRAMUSCULAR | Status: DC | PRN

## 2018-03-29 MED ORDER — FENTANYL CITRATE (PF) 100 MCG/2ML IJ SOLN
INTRAMUSCULAR | Status: AC
  Filled 2018-03-29: qty 2

## 2018-03-29 MED ORDER — SUCCINYLCHOLINE CHLORIDE 20 MG/ML IJ SOLN
INTRAMUSCULAR | Status: DC | PRN
  Administered 2018-03-29: 100 mg via INTRAVENOUS

## 2018-03-29 MED ORDER — ONDANSETRON HCL 4 MG/2ML IJ SOLN
INTRAMUSCULAR | Status: DC | PRN
  Administered 2018-03-29: 4 mg via INTRAVENOUS

## 2018-03-29 MED ORDER — SUGAMMADEX SODIUM 200 MG/2ML IV SOLN
INTRAVENOUS | Status: DC | PRN
  Administered 2018-03-29: 150 mg via INTRAVENOUS

## 2018-03-29 MED ORDER — ROCURONIUM BROMIDE 100 MG/10ML IV SOLN
INTRAVENOUS | Status: DC | PRN
  Administered 2018-03-29: 10 mg via INTRAVENOUS
  Administered 2018-03-29: 5 mg via INTRAVENOUS

## 2018-03-29 MED ORDER — BUTAMBEN-TETRACAINE-BENZOCAINE 2-2-14 % EX AERO: 1.0000 | INHALATION_SPRAY | Freq: Once | CUTANEOUS | Status: DC

## 2018-03-29 MED ORDER — MIDAZOLAM HCL 5 MG/5ML IJ SOLN
INTRAMUSCULAR | Status: DC | PRN
  Administered 2018-03-29: 2 mg via INTRAVENOUS

## 2018-03-29 MED ORDER — PROPOFOL 10 MG/ML IV BOLUS
INTRAVENOUS | Status: AC
  Filled 2018-03-29: qty 20

## 2018-03-29 MED ORDER — FENTANYL CITRATE (PF) 100 MCG/2ML IJ SOLN: 25.0000 ug | INTRAMUSCULAR | Status: DC | PRN

## 2018-03-29 MED ORDER — PROPOFOL 10 MG/ML IV BOLUS
INTRAVENOUS | Status: DC | PRN
  Administered 2018-03-29: 140 mg via INTRAVENOUS

## 2018-03-29 MED ORDER — LIDOCAINE HCL (CARDIAC) PF 100 MG/5ML IV SOSY
PREFILLED_SYRINGE | INTRAVENOUS | Status: DC | PRN
  Administered 2018-03-29: 80 mg via INTRAVENOUS

## 2018-03-29 MED ORDER — MEPERIDINE HCL 50 MG/ML IJ SOLN: 6.2500 mg | INTRAMUSCULAR | Status: DC | PRN

## 2018-03-29 MED ORDER — MIDAZOLAM HCL 2 MG/2ML IJ SOLN
INTRAMUSCULAR | Status: AC
  Filled 2018-03-29: qty 2

## 2018-03-29 MED ORDER — ONDANSETRON HCL 4 MG/2ML IJ SOLN
INTRAMUSCULAR | Status: AC
  Filled 2018-03-29: qty 2

## 2018-03-29 MED ORDER — FENTANYL CITRATE (PF) 100 MCG/2ML IJ SOLN
INTRAMUSCULAR | Status: DC | PRN
  Administered 2018-03-29 (×2): 50 ug via INTRAVENOUS

## 2018-03-29 MED ORDER — SODIUM CHLORIDE 0.9 % IV SOLN: Freq: Once | INTRAVENOUS | Status: DC

## 2018-03-29 MED ORDER — SUCCINYLCHOLINE CHLORIDE 20 MG/ML IJ SOLN
INTRAMUSCULAR | Status: AC
  Filled 2018-03-29: qty 1

## 2018-03-29 MED ORDER — ROCURONIUM BROMIDE 50 MG/5ML IV SOLN
INTRAVENOUS | Status: AC
  Filled 2018-03-29: qty 1

## 2018-03-29 MED ORDER — OXYCODONE HCL 5 MG/5ML PO SOLN: 5.0000 mg | Freq: Once | ORAL | Status: DC | PRN

## 2018-03-29 MED ORDER — DEXAMETHASONE SODIUM PHOSPHATE 10 MG/ML IJ SOLN
INTRAMUSCULAR | Status: AC
  Filled 2018-03-29: qty 1

## 2018-03-29 MED ORDER — DEXAMETHASONE SODIUM PHOSPHATE 10 MG/ML IJ SOLN
INTRAMUSCULAR | Status: DC | PRN
  Administered 2018-03-29: 5 mg via INTRAVENOUS

## 2018-03-29 MED ORDER — SUGAMMADEX SODIUM 200 MG/2ML IV SOLN
INTRAVENOUS | Status: AC
  Filled 2018-03-29: qty 2

## 2018-03-29 MED ORDER — FAMOTIDINE 20 MG PO TABS
20.0000 mg | ORAL_TABLET | Freq: Once | ORAL | Status: AC
  Administered 2018-03-29: 20 mg via ORAL

## 2018-03-29 NOTE — Anesthesia Preprocedure Evaluation (Signed)
Anesthesia Evaluation  Patient identified by MRN, date of birth, ID band Patient awake    Reviewed: Allergy & Precautions, NPO status , Patient's Chart, lab work & pertinent test results  History of Anesthesia Complications Negative for: history of anesthetic complications  Airway Mallampati: II  TM Distance: >3 FB Neck ROM: Full    Dental  (+) Edentulous Upper   Pulmonary neg sleep apnea, neg COPD, Current Smoker,    breath sounds clear to auscultation- rhonchi (-) wheezing      Cardiovascular hypertension, Pt. on medications (-) CAD, (-) Past MI, (-) Cardiac Stents and (-) CABG  Rhythm:Regular Rate:Normal - Systolic murmurs and - Diastolic murmurs    Neuro/Psych negative neurological ROS  negative psych ROS   GI/Hepatic negative GI ROS, Neg liver ROS,   Endo/Other  negative endocrine ROSneg diabetes  Renal/GU negative Renal ROS     Musculoskeletal negative musculoskeletal ROS (+)   Abdominal (+) - obese,   Peds  Hematology negative hematology ROS (+)   Anesthesia Other Findings Past Medical History: No date: Hypertension   Reproductive/Obstetrics                             Anesthesia Physical Anesthesia Plan  ASA: II  Anesthesia Plan: General   Post-op Pain Management:    Induction: Intravenous  PONV Risk Score and Plan: 0 and Ondansetron  Airway Management Planned: Oral ETT  Additional Equipment:   Intra-op Plan:   Post-operative Plan: Extubation in OR  Informed Consent: I have reviewed the patients History and Physical, chart, labs and discussed the procedure including the risks, benefits and alternatives for the proposed anesthesia with the patient or authorized representative who has indicated his/her understanding and acceptance.   Dental advisory given  Plan Discussed with: CRNA and Anesthesiologist  Anesthesia Plan Comments:         Anesthesia Quick  Evaluation

## 2018-03-29 NOTE — Anesthesia Post-op Follow-up Note (Signed)
Anesthesia QCDR form completed.        

## 2018-03-29 NOTE — Op Note (Signed)
PROCEDURE:  BRONCHOSCOPY  With BIOPSIES,BRUSHINGS,BRONCHIAL LAVAGE ENDOBRONCHIAL ULTRASOUND with TBNA   PROCEDURE DATE: 03/29/2018  TIME:  NAME:  Philip Weiss  DOB:[birth year]  MRN: [id number] LOC:  ARPO/None    HOSP DAY: _0 @ CODE STATUS:    Full    Indications/Preliminary Diagnosis:Adenopathy  Consent: (Place X beside choice/s below)  The benefits, risks and possible complications of the procedure were        explained to:  _X  _ patient  _X__ patient's family  ___ other:___________  who verbalized understanding and gave:  ___ verbal  ___ written  _X__ verbal and written  ___ telephone  ___ other:________ consent.      Unable to obtain consent; procedure performed on emergent basis.     Other:       PRESEDATION ASSESSMENT: History and Physical has been performed. Patient meds and allergies have been reviewed. Presedation airway examination has been performed and documented. Baseline vital signs, sedation score, oxygenation status, and cardiac rhythm were reviewed. Patient was deemed to be in satisfactory condition to undergo the procedure.    PREMEDICATIONS: SEE ANESTHESIOLOGY RECORDS   Insertion Route (Place X beside choice below)   Nasal   Oral  X Endotracheal Tube   Tracheostomy   INTRAPROCEDURE MEDICATIONS: SEE ANESTHESIOLOGY RECORDS   PROCEDURE DETAILS: Timeout performed and correct patient, name, & ID confirmed. Following prep per Pulmonary policy, appropriate sedation was administered. Patient was inducted under general anesthesia by cRNA with anesthesiologist present and intubated with an 8.5 ETT without incident. Portex adapter was placed on the ETT. The Olympus therapeutic video bronchoscope was advanced via the existing vortex adapter. The visible areas of the trachea were normal. Carina was full. The right upper lobe sub segments showed no endobronchial lesions. Right middle lobe had a polypoid mass arising right at the opening. There were some  clear secretions noted trapped behind the lesion. These were suction to clear. The right lower lobe subsegments had no endobronchial lesions and no mucosal abnormalities. Attention was placed then to the left tracheobronchial tree and no endobronchial lesions were noted there. The patient did have some copious secretions that were lavaged still clear. Attention was then placed to the right middle lobe mass. At this point the area was blanched with 3 mL of 1 to 10,000 solution of epinephrine. Endobronchial biopsies were then performed with [facility] Scientific cup forceps. A total of six endobronchial biopsies were performed. Confirmation of sample adequacy was offered by cytotechnologist on site. At this point, endobronchial brushings were done times two. Again sample adequacy was confirmed. After this was done, bronchoalveolar lavage was performed instilling 60 mL's of saline obtaining approximately 15 mL of aliquot for cyto analysis. Once this portion of the procedure was completed, bronchoscope was then switched to an Olympus endobronchial ultrasound scope. The area of interest which was the subcarinal base was examined. The best needle trajectory appeared to be from the left subcarinal area. Utilizing real-time ultrasound an Olympus EBUS needle, 21gauge, was then utilized to samples the subcarinal space. Under ultrasound the mass appeared to be heterogeneous with some areas of necrosis. These were avoided. A total of four passes were performed with a 21gauge needle, adequacy of specimen was confirmed and noted to be on the third pass. Once this was completed, the airway was examined and hemostasis was noted to be excellent. Total blood loss was 5 mL's or less. The patient received a total of 9 mL's of lidocaine 1% via bronchial lavage prior to withdrawing the scope.  At this point the scope was withdrawn and the procedure was terminated. The patient was allowed to emerge from general anesthesia and was extubated in  the procedure room without difficulty. He was transferred to the PACU in satisfactory addition. Patient tolerated the procedure well, no immediate complications noted.    SPECIMENS (Sites): (Place X beside choice below)  Specimens Description   No Specimens Obtained     Washings   X Lavage  right middle lobe X 6  X Biopsies  endobronchial, RML   X Fine Needle Aspirates  left sub carinal, TBNA   X Brushings  right middle lobe X2   Sputum    FINDINGS:  1) Right middle lobe endobronchial mass                      2) Large sub coronal mass/adenopathy   ESTIMATED BLOOD LOSS: 5 ml  PATIENTS TOLERANCE OF PROCEDURE: Excellent  COMPLICATIONS/RESOLUTION: none     IMPRESSION:POST-PROCEDURE DX:   1) RML endobronchial mass, carcinoma highly suspected  2) SUBCARINAL ADENOPATHY/MASS    RECOMMENDATION/PLAN:  Follow up Pathology Reports  Follow up with Dr. Janese Banks Oncology as scheduled Follow up with Dr. Patsey Berthold as scheduled  The patient was advised of the findings. The patient's family was also advised of the findings.   Renold Don, M.D.  [facility]  Advanced Bronchoscopy

## 2018-03-29 NOTE — Transfer of Care (Signed)
Immediate Anesthesia Transfer of Care Note  Patient: Philip Weiss  Procedure(s) Performed: ENDOBRONCHIAL ULTRASOUND (Right ) FLEXIBLE BRONCHOSCOPY (Right )  Patient Location: PACU  Anesthesia Type:General  Level of Consciousness: awake and patient cooperative  Airway & Oxygen Therapy: Patient Spontanous Breathing and Patient connected to face mask oxygen  Post-op Assessment: Report given to RN and Post -op Vital signs reviewed and stable  Post vital signs: Reviewed and stable  Last Vitals:  Vitals Value Taken Time  BP 145/82 03/29/2018 11:17 AM  Temp    Pulse 99 03/29/2018 11:17 AM  Resp 14 03/29/2018 11:17 AM  SpO2 99 % 03/29/2018 11:17 AM  Vitals shown include unvalidated device data.  Last Pain:  Vitals:   03/29/18 0916  TempSrc: Oral         Complications: No apparent anesthesia complications

## 2018-03-29 NOTE — Anesthesia Procedure Notes (Signed)
Procedure Name: Intubation Date/Time: 03/29/2018 10:20 AM Performed by: Dionne Bucy, CRNA Pre-anesthesia Checklist: Patient identified, Patient being monitored, Timeout performed, Emergency Drugs available and Suction available Patient Re-evaluated:Patient Re-evaluated prior to induction Oxygen Delivery Method: Circle system utilized Preoxygenation: Pre-oxygenation with 100% oxygen Induction Type: IV induction Ventilation: Mask ventilation without difficulty Laryngoscope Size: Mac and 3 Grade View: Grade I Tube type: Oral Tube size: 8.5 mm Number of attempts: 1 Airway Equipment and Method: Stylet Placement Confirmation: ETT inserted through vocal cords under direct vision,  positive ETCO2 and breath sounds checked- equal and bilateral Secured at: 23 cm Tube secured with: Tape Dental Injury: Teeth and Oropharynx as per pre-operative assessment

## 2018-03-29 NOTE — Discharge Instructions (Signed)
AMBULATORY SURGERY  DISCHARGE INSTRUCTIONS   1) The drugs that you were given will stay in your system until tomorrow so for the next 24 hours you should not:  A) Drive an automobile B) Make any legal decisions C) Drink any alcoholic beverage   2) You may resume regular meals tomorrow.  Today it is better to start with liquids and gradually work up to solid foods.  You may eat anything you prefer, but it is better to start with liquids, then soup and crackers, and gradually work up to solid foods.   3) Please notify your doctor immediately if you have any unusual bleeding, trouble breathing, redness and pain at the surgery site, drainage, fever, or pain not relieved by medication.    4) Additional Instructions:        Please contact your physician with any problems or Same Day Surgery at [phone], Monday through Friday 6 am to 4 pm, or [place] at [facility] number at [phone].bro Flexible Bronchoscopy, Care After These instructions give you information on caring for yourself after your procedure. Your doctor may also give you more specific instructions. Call your doctor if you have any problems or questions after your procedure. Follow these instructions at home:  Do not eat or drink anything for 2 hours after your procedure. If you try to eat or drink before the medicine wears off, food or drink could go into your lungs. You could also burn yourself.  After 2 hours have passed and when you can cough and gag normally, you may eat soft food and drink liquids slowly.  The day after the test, you may eat your normal diet.  You may do your normal activities.  Keep all doctor visits. Get help right away if:  You get more and more short of breath.  You get light-headed.  You feel like you are going to pass out (faint).  You have chest pain.  You have new problems that worry you.  You cough up more than a little blood.  You cough up more blood than  before. This information is not intended to replace advice given to you by your health care provider. Make sure you discuss any questions you have with your health care provider. Document Released: 03/22/2009 Document Revised: 10/31/2015 Document Reviewed: 01/27/2013 Elsevier Interactive Patient Education  2017 [employer].

## 2018-03-29 NOTE — Anesthesia Postprocedure Evaluation (Signed)
Anesthesia Post Note  Patient: Philip Weiss  Procedure(s) Performed: ENDOBRONCHIAL ULTRASOUND (Right ) FLEXIBLE BRONCHOSCOPY (Right )  Patient location during evaluation: PACU Anesthesia Type: General Level of consciousness: awake and alert and oriented Pain management: pain level controlled Vital Signs Assessment: post-procedure vital signs reviewed and stable Respiratory status: spontaneous breathing, nonlabored ventilation and respiratory function stable Cardiovascular status: blood pressure returned to baseline and stable Postop Assessment: no signs of nausea or vomiting Anesthetic complications: no     Last Vitals:  Vitals:   03/29/18 1148 03/29/18 1202  BP: 121/78 (!) 138/57  Pulse: 80 74  Resp: 16 18  Temp: 37.2 C 36.7 C  SpO2: 95% 95%    Last Pain:  Vitals:   03/29/18 1202  TempSrc: Temporal  PainSc: 0-No pain                 Kaylia Winborne

## 2018-03-29 NOTE — Progress Notes (Signed)
Please see consultation note from visit on 25 Mar 2018 at the [facility] (under Progress notes). The patient has had no new issues in the interim. Exam unchanged from 25 Mar 2018.   Consent signed.   Proceed with bronchoscopy with transbronchial biopsy and endobronchial ultrasound with TBNA. This will be done under general anesthesia.

## 2018-03-29 NOTE — H&P (Signed)
H&P for bronchoscopy endobronchial ultrasound.  The patient was seen at the cancer center during multidisciplinary Lung Cancer clinic.  Subjective:    Patient ID: Philip Weiss, male    DOB: Aug 10, 1945, 72 y.o.   MRN: [id number]  HPI The patient is a 72 year old current smoker, who presented to the cancer center today after having had an abnormal CT chest for lung cancer screening on 11 October. We are asked to evaluate the patient for potential bronchoscopy. The patient is kindly referred by Dr. Janese Banks. The patient does not endorse any symptoms of weight loss, anorexia, chest pain, or hemoptysis. He had had a nonproductive cough which prompted him to present to the [facility] for evaluation. After that visit, the patient had a low dose CT scan of the chest performed. This was for lung cancer screening. The chest CT has shown a right middle lobe mass and significant mediastinal and right hilar adenopathy as well as a dense sub coronal mass. These films were individually reviewed. I have also reviewed the films with the patient and his wife. The patient also had a PET/CT this was performed on 16 October. PET/CT reveals that the findings noted above have significant FDG uptake.  The patient has noted does not endorse any associated symptoms except for the nonproductive cough with regards to these findings.  We discussed workup of these findings and the patient understands the potential methods for biopsy. Percutaneous needle biopsy as well as bronchoscopy within the bronchial ultrasound were presented to the patient. The patient elected to go with bronchoscopy within the bronchial ultrasound. He understands the potential benefits, limitations and complications of this procedure to include pneumothorax, bleeding, infection to name a few. The patient also understands the procedure will be performed under general anesthesia.  The patient's past medical history, family history, social history and  surgical history have been reviewed.  He has no military background. He has worked in [employer]. He has since retired.  Review of Systems  Constitutional: Negative.   HENT: Negative.   Eyes: Negative.   Respiratory: Positive for cough (Non productive).   Cardiovascular: Negative.   Gastrointestinal: Negative.   Endocrine: Negative.   Genitourinary: Negative.   Musculoskeletal: Negative.   Skin: Negative.   Allergic/Immunologic: Negative.   Neurological: Negative.   Hematological: Negative.   Psychiatric/Behavioral: Negative.        Objective:   Physical Exam  Constitutional: He is oriented to person, place, and time. He appears well-developed and well-nourished. No distress.  HENT:  Head: Normocephalic and atraumatic.  Right Ear: External ear normal.  Left Ear: External ear normal.  Nose: Nose normal.  Mouth/Throat: Oropharynx is clear and moist.  Poor dentition/edentulous, Mallampati Class I.  Eyes: Pupils are equal, round, and reactive to light. Conjunctivae and EOM are normal. No scleral icterus.  Neck: Normal range of motion. Neck supple. No JVD present. No tracheal deviation present. No thyromegaly present.  Cardiovascular: Normal rate, regular rhythm, normal heart sounds and intact distal pulses. Exam reveals no gallop and no friction rub.  No murmur heard. Pulmonary/Chest: Effort normal. No stridor. No respiratory distress. He has no wheezes. He has no rales. He exhibits no tenderness.  Coarse breath sounds  Abdominal: Soft. Bowel sounds are normal. He exhibits no distension.  Musculoskeletal: Normal range of motion. He exhibits no deformity.  Lymphadenopathy:    He has no cervical adenopathy.  Neurological: He is alert and oriented to person, place, and time.  No focal deficits noted  Skin: Skin  is warm and dry. No rash noted. No erythema. No pallor.  Psychiatric: He has a normal mood and affect. Thought content normal.  Nursing note and vitals  reviewed.   Patient's imaging has been reviewed in detail. I have also reviewed the images with the patient and his wife. The patient will need update on laboratory data particularly since he is to undergo general anesthesia.      Assessment & Plan:   1) Right lower lobe lung mass: carcinoma until proven otherwise. The patient will need definitive diagnosis. As can be achieved with bronchoscopy will biopsies of the right lower lobe under fluoroscopy. In addition, as noted below the patient has significant mediastinal adenopathy and this will be evaluated in the same setting as below.  2) Mediastinal adenopathy: the patient has bulking mediastinal adenopathy/mass, this is likely related to his primary process. This can be assessed at the time of bronchoscopy wit endobronchial ultrasound and transbronchial needle aspirate (TBNA). The patient understands of the procedure will be performed under general anesthesia. He understands the potential benefits, limitations and complications of the procedure and agrees to go ahead.  3) Tobacco dependence due to cigarettes: the patient was counseled regards to discontinuation of smoking average33 minutes counseling time.   Patient has been scheduled for bronchoscopy with right lower lobe biopsy under fluoroscopy and endobronchial ultrasound with mediastinal node/mass sampling for today..    I have seen and examined the patient and there are no changes from his initial assessment of 25 March 2018 as noted above. Examination is unchanged.

## 2018-03-31 LAB — CYTOLOGY - NON PAP

## 2018-03-31 LAB — SURGICAL PATHOLOGY

## 2018-04-05 ENCOUNTER — Telehealth: Payer: Self-pay | Admitting: *Deleted

## 2018-04-05 ENCOUNTER — Telehealth: Payer: Self-pay

## 2018-04-05 ENCOUNTER — Other Ambulatory Visit: Payer: Self-pay

## 2018-04-05 ENCOUNTER — Encounter: Payer: Self-pay | Admitting: Oncology

## 2018-04-05 ENCOUNTER — Encounter: Payer: Self-pay | Admitting: *Deleted

## 2018-04-05 ENCOUNTER — Inpatient Hospital Stay (HOSPITAL_BASED_OUTPATIENT_CLINIC_OR_DEPARTMENT_OTHER): Payer: Medicare PPO | Admitting: Oncology

## 2018-04-05 ENCOUNTER — Ambulatory Visit
Admission: RE | Admit: 2018-04-05 | Discharge: 2018-04-05 | Disposition: A | Discharge status: Home / Self Care | Payer: Medicare PPO | Source: Ambulatory Visit | Attending: Radiation Oncology | Admitting: Radiation Oncology

## 2018-04-05 VITALS — BP 126/71 | HR 96 | Temp 97.4°F | Resp 18 | Ht 72.0 in | Wt 157.0 lb

## 2018-04-05 DIAGNOSIS — R7301 Impaired fasting glucose: Secondary | ICD-10-CM | POA: Diagnosis present

## 2018-04-05 DIAGNOSIS — C771 Secondary and unspecified malignant neoplasm of intrathoracic lymph nodes: Secondary | ICD-10-CM | POA: Diagnosis not present

## 2018-04-05 DIAGNOSIS — C342 Malignant neoplasm of middle lobe, bronchus or lung: Secondary | ICD-10-CM | POA: Diagnosis not present

## 2018-04-05 DIAGNOSIS — I10 Essential (primary) hypertension: Secondary | ICD-10-CM

## 2018-04-05 DIAGNOSIS — J849 Interstitial pulmonary disease, unspecified: Secondary | ICD-10-CM | POA: Diagnosis present

## 2018-04-05 DIAGNOSIS — I639 Cerebral infarction, unspecified: Principal | ICD-10-CM | POA: Diagnosis present

## 2018-04-05 DIAGNOSIS — Z79899 Other long term (current) drug therapy: Secondary | ICD-10-CM

## 2018-04-05 DIAGNOSIS — Z7189 Other specified counseling: Secondary | ICD-10-CM

## 2018-04-05 DIAGNOSIS — Z87891 Personal history of nicotine dependence: Secondary | ICD-10-CM

## 2018-04-05 DIAGNOSIS — Z66 Do not resuscitate: Secondary | ICD-10-CM | POA: Diagnosis present

## 2018-04-05 DIAGNOSIS — R5383 Other fatigue: Secondary | ICD-10-CM | POA: Diagnosis not present

## 2018-04-05 DIAGNOSIS — Z886 Allergy status to analgesic agent status: Secondary | ICD-10-CM

## 2018-04-05 DIAGNOSIS — F17210 Nicotine dependence, cigarettes, uncomplicated: Secondary | ICD-10-CM | POA: Diagnosis not present

## 2018-04-05 DIAGNOSIS — C3490 Malignant neoplasm of unspecified part of unspecified bronchus or lung: Secondary | ICD-10-CM | POA: Insufficient documentation

## 2018-04-05 DIAGNOSIS — R29700 NIHSS score 0: Secondary | ICD-10-CM | POA: Diagnosis present

## 2018-04-05 MED ORDER — LORAZEPAM 0.5 MG PO TABS: 0.5000 mg | ORAL_TABLET | Freq: Four times a day (QID) | ORAL | 0 refills | Status: DC | PRN

## 2018-04-05 MED ORDER — LIDOCAINE-PRILOCAINE 2.5-2.5 % EX CREA: TOPICAL_CREAM | CUTANEOUS | 3 refills | Status: DC

## 2018-04-05 MED ORDER — ONDANSETRON HCL 8 MG PO TABS: 8.0000 mg | ORAL_TABLET | Freq: Two times a day (BID) | ORAL | 1 refills | Status: DC | PRN

## 2018-04-05 MED ORDER — PROCHLORPERAZINE MALEATE 10 MG PO TABS: 10.0000 mg | ORAL_TABLET | Freq: Four times a day (QID) | ORAL | 1 refills | Status: DC | PRN

## 2018-04-05 MED ORDER — DEXAMETHASONE 4 MG PO TABS: 8.0000 mg | ORAL_TABLET | Freq: Every day | ORAL | 1 refills | Status: DC

## 2018-04-05 NOTE — Telephone Encounter (Signed)
Cannot accept escribed narcotics, needs a fax of prescription for Lorazepam

## 2018-04-05 NOTE — Progress Notes (Signed)
Hematology/Oncology Consult note [facility]  Telephone:([phone] [phone]  Patient Care Team: Center, [facility] 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  He underwent lung cancer screening low-dose CT scan which showed a right middle lobe lung mass of about 4.8 cm.  Subcarinal adenopathy 2.8 x 4 cm and probable right hilar adenopathy and soft tissue fullness 2.3 cm.  This was followed by a PET CT scan which showed right middle lobe mass was hypermetabolic at 56.3 with hypermetabolic right hilar and infrahilar adenopathy.  Hypermetabolic 2.5 cm subcarinal node and right paratracheal nodes.  No hypermetabolic contralateral adenopathy.  Left upper lobe 4 mm solitary pulmonary nodule below PET resolution.  No evidence of hypermetabolic them elsewhere.  Biopsy of the right middle lobe showed non-small cell carcinoma favor adenocarcinoma.  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  Appetite is fair.  Denies any shortness of breath or pain  ECOG PS- 1 Pain scale- 0 Opioid associated constipation- no  Review of systems- Review of Systems  Constitutional: Negative for chills, fever, malaise/fatigue and weight loss.  HENT: Negative for congestion, ear discharge and nosebleeds.   Eyes: Negative for blurred vision.  Respiratory: Negative for cough, hemoptysis, sputum production, shortness of breath and wheezing.   Cardiovascular:  Negative for chest pain, palpitations, orthopnea and claudication.  Gastrointestinal: Negative for abdominal pain, blood in stool, constipation, diarrhea, heartburn, melena, nausea and vomiting.  Genitourinary: Negative for dysuria, flank pain, frequency, hematuria and urgency.  Musculoskeletal: Negative for back pain, joint pain and myalgias.  Skin: Negative for rash.  Neurological: Negative for dizziness, tingling, focal weakness, seizures, weakness and headaches.  Endo/Heme/Allergies: Does not bruise/bleed easily.  Psychiatric/Behavioral: Negative for depression and suicidal ideas. The patient does not have insomnia.        No Known Allergies   Past Medical History:  Diagnosis Date  . Hypertension   . Mass of middle lobe of right lung 03/29/2018     Past Surgical History:  Procedure Laterality Date  . CYST EXCISION Left    buttock  . ENDOBRONCHIAL ULTRASOUND Right 03/29/2018   Procedure: ENDOBRONCHIAL ULTRASOUND;  Surgeon: Tyler Pita, MD;  Location: [facility] ORS;  Service: Cardiopulmonary;  Laterality: Right;  . FLEXIBLE BRONCHOSCOPY Right 03/29/2018   Procedure: FLEXIBLE BRONCHOSCOPY;  Surgeon: Tyler Pita, MD;  Location: [facility] ORS;  Service: Cardiopulmonary;  Laterality: Right;    Social History   Socioeconomic History  . Marital status: Married    Spouse name: Not on file  . Number of children: Not on file  . Years of education: Not on file  . Highest education level: Not on file  Occupational History  . Not on file  Social Needs  . Financial resource strain: Not on file  . Food insecurity:    Worry: Not on file    Inability: Not on file  . Transportation needs:    Medical: Not on file    Non-medical: Not on file  Tobacco Use  .  Smoking status: Current Every Day Smoker    Packs/day: 1.00    Years: 58.00    Pack years: 58.00    Types: Cigarettes  . Smokeless tobacco: Never Used  Substance and Sexual Activity  . Alcohol use: Yes    Comment:  daily 32 oz beer  . Drug use: Not Currently  . Sexual activity: Not on file  Lifestyle  . Physical activity:    Days per week: Not on file    Minutes per session: Not on file  . Stress: Not on file  Relationships  . Social connections:    Talks on phone: Not on file    Gets together: Not on file    Attends religious service: Not on file    Active member of club or organization: Not on file    Attends meetings of clubs or organizations: Not on file    Relationship status: Not on file  . Intimate partner violence:    Fear of current or ex partner: Not on file    Emotionally abused: Not on file    Physically abused: Not on file    Forced sexual activity: Not on file  Other Topics Concern  . Not on file  Social History Narrative  . Not on file    Family History  Problem Relation Age of Onset  . Diabetes Mother   . Stroke Father      Current Outpatient Medications:  .  budesonide-formoterol (SYMBICORT) 160-4.5 MCG/ACT inhaler, Inhale 2 puffs into the lungs 2 (two) times daily., Disp: , Rfl:  .  Cyanocobalamin (VITAMIN B-12 PO), Take 1 tablet by mouth daily., Disp: , Rfl:  .  losartan (COZAAR) 25 MG tablet, Take 25 mg by mouth daily. , Disp: , Rfl:  .  Multiple Vitamins-Minerals (MULTIVITAMIN PO), Take 1 tablet by mouth daily., Disp: , Rfl:  .  Pyridoxine HCl (VITAMIN B-6 PO), Take 1 tablet by mouth daily., Disp: , Rfl:   Physical exam:  Vitals:   04/05/18 1011  BP: 126/71  Pulse: 96  Resp: 18  Temp: (!) 97.4 F (36.3 C)  TempSrc: Tympanic  SpO2: 99%  Weight: 157 lb (71.2 kg)  Height: 6' (1.829 m)   Physical Exam  Constitutional: He is oriented to person, place, and time.  HENT:  Head: Normocephalic and atraumatic.  Eyes: Pupils are equal, round, and reactive to light. EOM are normal.  Neck: Normal range of motion.  Cardiovascular: Normal rate, regular rhythm and normal heart sounds.  Pulmonary/Chest: Effort normal and breath sounds normal.  Abdominal: Soft.  Bowel sounds are normal.  Neurological: He is alert and oriented to person, place, and time.  Skin: Skin is warm and dry.     CMP Latest Ref Rng & Units 03/25/2018  Glucose 70 - 99 mg/dL 99  BUN 8 - 23 mg/dL 10  Creatinine 0.61 - 1.24 mg/dL 0.89  Sodium 135 - 145 mmol/L 135  Potassium 3.5 - 5.1 mmol/L 4.3  Chloride 98 - 111 mmol/L 100  CO2 22 - 32 mmol/L 27  Calcium 8.9 - 10.3 mg/dL 9.6  Total Protein 6.5 - 8.1 g/dL 8.0  Total Bilirubin 0.3 - 1.2 mg/dL 0.5  Alkaline Phos 38 - 126 U/L 88  AST 15 - 41 U/L 18  ALT 0 - 44 U/L 11   CBC Latest Ref Rng & Units 03/25/2018  WBC 4.0 - 10.5 K/uL 5.2  Hemoglobin 13.0 - 17.0 g/dL 12.4(L)  Hematocrit 39.0 - 52.0 % 37.9(L)  Platelets  150 - 400 K/uL 326    No images are attached to the encounter.  Nm Pet Image Initial (pi) Skull Base To Thigh  Result Date: 03/23/2018 CLINICAL DATA:  Initial treatment strategy for right lung mass on screening chest CT. EXAM: NUCLEAR MEDICINE PET SKULL BASE TO THIGH TECHNIQUE: 9.2 mCi F-18 FDG was injected intravenously. Full-ring PET imaging was performed from the skull base to thigh after the radiotracer. CT data was obtained and used for attenuation correction and anatomic localization. Fasting blood glucose: 113 mg/dl COMPARISON:  03/17/2018 screening chest CT. FINDINGS: Mediastinal blood pool activity: SUV max 2.8 NECK: No hypermetabolic lymph nodes in the neck. Incidental CT findings: none CHEST: Right middle lobe 4.9 x 3.9 cm mass (series 4/image 117) is intensely hypermetabolic with max SUV 83.2. There is postobstructive pneumonia/atelectasis in the right middle lobe peripheral to the mass. There is hypermetabolic right hilar/infrahilar adenopathy with representative max SUV 16.4 in the right infrahilar region. Hypermetabolic 2.5 cm right subcarinal node with max SUV 16.0. Mildly hypermetabolic right paratracheal nodes, largest 1.0 cm with max SUV 4.0 (series 4/image 80). No hypermetabolic contralateral  mediastinal, contralateral hilar or supraclavicular lymph nodes. Incidental CT findings: Left upper lobe 4 mm solid pulmonary nodule is below PET resolution (series 4/image 103). No additional significant pulmonary nodules. Trace dependent right pleural effusion. Coronary atherosclerosis. Atherosclerotic thoracic aorta with ectatic 4.2 cm ascending thoracic aorta. Mild centrilobular and paraseptal emphysema. ABDOMEN/PELVIS: No abnormal hypermetabolic activity within the liver, pancreas, adrenal glands, or spleen. No hypermetabolic lymph nodes in the abdomen or pelvis. Incidental CT findings: Atherosclerotic nonaneurysmal abdominal aorta. Mildly enlarged prostate. SKELETON: No focal hypermetabolic activity to suggest skeletal metastasis. Incidental CT findings: none IMPRESSION: 1. Intensely hypermetabolic 4.9 cm right middle lobe lung mass, compatible with primary bronchogenic carcinoma. Postobstructive pneumonia/atelectasis in the right middle lobe. 2. Hypermetabolic ipsilateral hilar, subcarinal and ipsilateral paratracheal adenopathy. 3. No distant hypermetabolic metastatic disease. 4. PET-CT staging is T2b N2 M0, stage IIIa. 5. Trace dependent right pleural effusion. 6. Solid 0.4 cm left upper lobe pulmonary nodule is below PET resolution and indeterminate. Follow-up chest CT advised in 3 months. 7. Ectatic 4.2 cm ascending thoracic aorta. Recommend annual imaging followup by CTA or MRA. This recommendation follows 2010 ACCF/AHA/AATS/ACR/ASA/SCA/SCAI/SIR/STS/SVM Guidelines for the Diagnosis and Management of Patients with Thoracic Aortic Disease. Circulation. 2010; 121: P498-Y641. 8. Aortic Atherosclerosis (ICD10-I70.0) and Emphysema (ICD10-J43.9). Electronically Signed   By: Ilona Sorrel M.D.   On: 03/23/2018 15:35   Ct Chest Lung Cancer Screening Low Dose Wo Contrast  Result Date: 03/18/2018 CLINICAL DATA:  Asymptomatic current smoker, greater than 30 pack years. One pack-per-day for 58 years. EXAM: CT  CHEST WITHOUT CONTRAST LOW-DOSE FOR LUNG CANCER SCREENING TECHNIQUE: Multidetector CT imaging of the chest was performed following the standard protocol without IV contrast. COMPARISON:  None. FINDINGS: Cardiovascular: Aortic atherosclerosis. Tortuous thoracic aorta. Mild cardiomegaly, without pericardial effusion. Lad or branch coronary artery atherosclerosis. Mediastinum/Nodes: No supraclavicular adenopathy. Subcarinal adenopathy, on the order of 2.8 x 4.0 cm on image 35/2. Probable right hilar adenopathy, with soft tissue fullness on the order of 2.3 cm on image 39/2. This is suboptimally evaluated on this noncontrast exam (difficult to differentiate from pulmonary artery branches). Fluid level in the esophagus. Lungs/Pleura: Small right pleural effusion. Mass-effect upon right middle and less so right lower lobe bronchi. Moderate centrilobular and mild paraseptal emphysema. Probable right greater than left upper lobe traction bronchiectasis. Right middle lobe central mass with surrounding interstitial thickening and ground-glass. This measures on  the order of volume derived equivalent diameter 4.8 Cm. Example on image 238/3. Left-sided pulmonary nodules measure maximally volume derived equivalent diameter 3.8 mm. Upper Abdomen: Normal imaged portions of the liver, spleen, stomach, pancreas, adrenal glands, kidneys. Musculoskeletal: Lower cervical spondylosis. IMPRESSION: 1. Lung-RADS 4B, suspicious. Additional imaging evaluation or consultation with Pulmonology or Thoracic Surgery recommended. 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 A component of the right middle lobe process is likely postobstructive pneumonitis. Consider tissue sampling via bronchoscopy versus PET to direct tissue sampling. 2. Aortic atherosclerosis (ICD10-I70.0), coronary artery atherosclerosis and emphysema (ICD10-J43.9). 3. Suspicion of interstitial lung  disease, as evidenced by architectural distortion and traction bronchiectasis in the upper lobes, worse on the right. 4. Esophageal air fluid level suggests dysmotility or gastroesophageal reflux. 5. Small right pleural effusion. These results will be called to the ordering clinician or representative by the Radiologist Assistant, and communication documented in the PACS or zVision Dashboard. Electronically Signed   By: Abigail Miyamoto M.D.   On: 03/18/2018 08:53     Assessment and plan- Patient is a 72 y.o. male with newly diagnosed adenocarcinoma of the right middle lobe of the lung stage IIIB cT2 N3 M0  I have reviewed PET/CT scan images independently and discussed the findings of the PET/CT as well as pathology with the patient in detail.  Biopsy of the right middle lobe showed non-small cell lung cancer favoring adenocarcinoma.  Biopsy of the left subcarinal space was also positive for non-small cell lung cancer.  I did verify with pulmonary if there was any contralateral adenopathy evident and Dr. Patsey Berthold tells me that this was a large subcarinal mass which was extending from the right side to the left side.  She biopsied from the left side because the right side mainly showed necrosis.  Given the large size of the subcarinal mass noted at the time of this as well as involvement of the left side this is likely N3 disease  Regardless, given the patient has stage III lung cancer I would recommend concurrent chemoradiation with weekly carboplatin AUC 2 along with Taxol at 45 mg/m concurrently with radiation.  Upon completion of chemoradiation I will plan to get repeat set of scans.  If scans at that time shows response to treatment he will proceed with maintenance immunotherapy with durvalumab at that time.  Discussed risks and benefits of chemotherapy including all but not limited to nausea, vomiting, fatigue, risk of low blood counts infections and hospitalization.  Risk of peripheral neuropathy and  infusion reaction associated with Taxol.  Treatment will be given with a curative intent.  Patient understands and agrees to proceed as planned.  Patient will need port placement prior to chemotherapy as well as chemotherapy class.  I will tentatively plan to start chemotherapy 1 week from now depending on the timing of radiation.  MRI brain is scheduled in 2 days time.  Cancer Staging Lung cancer [facility]) Staging form: Lung, AJCC 8th Edition - Clinical stage from 04/05/2018: cT2, cN3, cM0 - Signed by Sindy Guadeloupe, MD on 04/05/2018   Total face to face encounter time for this patient visit was 40 min. >50% of the time was  spent in counseling and coordination of care.     Visit Diagnosis 1. Goals of care, counseling/discussion   2. Malignant neoplasm of middle lobe of right lung ([place])      Dr. Randa Evens, MD, MPH [facility] [phone]  04/05/2018 12:56 PM

## 2018-04-05 NOTE — Progress Notes (Signed)
  Oncology Nurse Navigator Documentation  Navigator Location: [facility] (04/05/18 1400)   )Navigator Encounter Type: Initial RadOnc;Diagnostic Results;MDC Follow-up (04/05/18 1400)     Confirmed Diagnosis Date: 03/31/18 (04/05/18 1400)               Patient Visit Type: MedOnc;RadOnc (04/05/18 1400) Treatment Phase: Pre-Tx/Tx Discussion (04/05/18 1400) Barriers/Navigation Needs: Education;Coordination of Care (04/05/18 1400) Education: Understanding Cancer/ Treatment Options;Newly Diagnosed Cancer Education (04/05/18 1400) Interventions: Coordination of Care;Education (04/05/18 1400)   Coordination of Care: Appts;Chemo (04/05/18 1400) Education Method: Verbal;Written (04/05/18 1400)       met with patient and his family during follow up visit with Dr. Janese Banks to discuss pathology results and treatment options. All questions answered during visit. Pt and family given resources regarding diagnosis and supportive services available. Reviewed upcoming appts. Informed pt to call with any further questions or needs. Pt and family verbalized understanding.         Time Spent with Patient: 90 (04/05/18 1400)

## 2018-04-05 NOTE — Patient Instructions (Signed)

## 2018-04-05 NOTE — Progress Notes (Signed)
START ON PATHWAY REGIMEN - Non-Small Cell Lung     Administer weekly:     Paclitaxel      Carboplatin   **Always confirm dose/schedule in your pharmacy ordering system**  Patient Characteristics: Stage III - Unresectable, PS = 0, 1 AJCC T Category: T2b Current Disease Status: No Distant Mets or Local Recurrence AJCC N Category: N3 AJCC M Category: M0 AJCC 8 Stage Grouping: IIIB Performance Status: PS = 0, 1 Intent of Therapy: Curative Intent, Discussed with Patient

## 2018-04-05 NOTE — Telephone Encounter (Signed)
Courtney re did the rx and [facility]ed it [facility]

## 2018-04-05 NOTE — Telephone Encounter (Signed)
Faxed script for Lorazepam 0.5 mg [employer] / faxed conformation confirmed

## 2018-04-05 NOTE — Progress Notes (Signed)
No new changes noted today 

## 2018-04-05 NOTE — Consult Note (Signed)
NEW PATIENT EVALUATION  Name: Philip Weiss  MRN: [id number]  Date:   04/05/2018     DOB: 01-25-1946   This 72 y.o. 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 Lung Cancer    Pt is here for initial consultation of lung cancer    DIAGNOSIS: The encounter diagnosis was Malignant neoplasm of middle lobe, bronchus or lung ([place]).   PREVIOUS INVESTIGATIONS:  CT scans PET/CT scans reviewed Clinical notes reviewed Pathology reports reviewed  HPI: patient is a 72 year old male greater than 60-pack-year smoking historywho presented to low-dose screening clinic with a right middle lobe lung mass proximally 4.8 cm. He was also noted initially to have subcarinal adenopathy and right hilar adenopathy. PET CT scan demonstrated intensely hypermetabolic 4.9 cm right middle lobe mass compatible with primary bronchogenic carcinoma. Patient also hypermetabolic ipsilateral hilar subcarinal and ipsilateral paratracheal adenopathy. No distal metastatic disease was identified. Patient did have an incidental left upper lobe 0.4 cm nodule below PET resolution with follow-up CT scan advised. Patient underwent endobronchial biopsy which was positive for non-small cell lung cancer favoring adenocarcinomaPatientis seen today for radiation oncology opinion. He has a mild nonproductive cough no dysphagia he has lostbetween 5 and 10 pounds over the past several months.  PLANNED TREATMENT REGIMEN: concurrent chemoradiation  PAST MEDICAL HISTORY:  has a past medical history of Hypertension and Mass of middle lobe of right lung (03/29/2018).    PAST SURGICAL HISTORY:  Past Surgical History:  Procedure Laterality Date  . CYST EXCISION Left    buttock  . ENDOBRONCHIAL ULTRASOUND Right 03/29/2018   Procedure:  ENDOBRONCHIAL ULTRASOUND;  Surgeon: Tyler Pita, MD;  Location: [facility] ORS;  Service: Cardiopulmonary;  Laterality: Right;  . FLEXIBLE BRONCHOSCOPY Right 03/29/2018   Procedure: FLEXIBLE BRONCHOSCOPY;  Surgeon: Tyler Pita, MD;  Location: [facility] ORS;  Service: Cardiopulmonary;  Laterality: Right;    FAMILY HISTORY: family history includes Diabetes in his mother; Stroke in his father.  SOCIAL HISTORY:  reports that he has been smoking cigarettes. He has a 58.00 pack-year smoking history. He has never used smokeless tobacco. He reports that he drinks alcohol. He reports that he has current or past drug history.  ALLERGIES: Patient has no known allergies.  MEDICATIONS:  Current Outpatient Medications  Medication Sig Dispense Refill  . budesonide-formoterol (SYMBICORT) 160-4.5 MCG/ACT inhaler Inhale 2 puffs into the lungs 2 (two) times daily.    . Cyanocobalamin (VITAMIN B-12 PO) Take 1 tablet by mouth daily.    Marland Kitchen dexamethasone (DECADRON) 4 MG tablet Take 2 tablets (8 mg total) by mouth daily. Start the day after chemotherapy for 2 days. 30 tablet 1  . lidocaine-prilocaine (EMLA) cream Apply to affected area once 30 g 3  . LORazepam (ATIVAN) 0.5 MG tablet Take 1 tablet (0.5 mg total) by mouth every 6 (six) hours as needed (Nausea or vomiting). 30 tablet 0  . losartan (COZAAR) 25 MG tablet Take 25 mg by mouth daily.     . Multiple Vitamins-Minerals (MULTIVITAMIN PO) Take 1 tablet by mouth daily.    . ondansetron (ZOFRAN) 8 MG tablet Take 1 tablet (8 mg total) by mouth 2 (two) times daily as needed for refractory nausea / vomiting. Start on day 3 after chemo. 30 tablet 1  . prochlorperazine (COMPAZINE) 10 MG tablet Take 1 tablet (  10 mg total) by mouth every 6 (six) hours as needed (Nausea or vomiting). 30 tablet 1  . Pyridoxine HCl (VITAMIN B-6 PO) Take 1 tablet by mouth daily.     No current facility-administered medications for this encounter.     ECOG PERFORMANCE STATUS:  0 -  Asymptomatic  REVIEW OF SYSTEMS:  Patient denies any weight loss, fatigue, weakness, fever, chills or night sweats. Patient denies any loss of vision, blurred vision. Patient denies any ringing  of the ears or hearing loss. No irregular heartbeat. Patient denies heart murmur or history of fainting. Patient denies any chest pain or pain radiating to her upper extremities. Patient denies any shortness of breath, difficulty breathing at night, cough or hemoptysis. Patient denies any swelling in the lower legs. Patient denies any nausea vomiting, vomiting of blood, or coffee ground material in the vomitus. Patient denies any stomach pain. Patient states has had normal bowel movements no significant constipation or diarrhea. Patient denies any dysuria, hematuria or significant nocturia. Patient denies any problems walking, swelling in the joints or loss of balance. Patient denies any skin changes, loss of hair or loss of weight. Patient denies any excessive worrying or anxiety or significant depression. Patient denies any problems with insomnia. Patient denies excessive thirst, polyuria, polydipsia. Patient denies any swollen glands, patient denies easy bruising or easy bleeding. Patient denies any recent infections, allergies or URI. Patient "s visual fields have not changed significantly in recent time.    PHYSICAL EXAM: There were no vitals taken for this visit. thin male in NAD.Well-developed well-nourished patient in NAD. HEENT reveals PERLA, EOMI, discs not visualized.  Oral cavity is clear. No oral mucosal lesions are identified. Neck is clear without evidence of cervical or supraclavicular adenopathy. Lungs are clear to A&P. Cardiac examination is essentially unremarkable with regular rate and rhythm without murmur rub or thrill. Abdomen is benign with no organomegaly or masses noted. Motor sensory and DTR levels are equal and symmetric in the upper and lower extremities. Cranial nerves II through XII are  grossly intact. Proprioception is intact. No peripheral adenopathy or edema is identified. No motor or sensory levels are noted. Crude visual fields are within normal range.  LABORATORY DATA: pathology reports reviewed    RADIOLOGY RESULTS:cT scans and PET/CT scans reviewed   IMPRESSION: stage IIIc adenocarcinoma the right lung in 72 year old male for concurrent chemoradiation  PLAN: at this time I have recommended radiation therapy with concurrent chemotherapy. I would favor IM RT radiation therapy based on my ability to treat both the right lung mass as well as his subcarinal and hilar adenopathy using I MRT dose painting technique. Would take his lung mass to 7000 cGy over 7 weeks treating his mediastinal adenopathy up to approximate 6600 cGy using dose painting technique.Will use PET fusion study in my treatment planning as well as 4-dimensional treatment planning. Risks and benefits of treatment including possible dysphasia from radiation esophagitis fatigue alteration of blood counts skin reaction.There will be extra effort by both professional staff as well as technical staff to coordinate and manage concurrent chemoradiation and ensuing side effects during his treatments.patient and family seem to comprehend my treatment plan well. I personally set up and ordered CT simulation for tomorrow.  I would like to take this opportunity to thank you for allowing me to participate in the care of your patient.Noreene Filbert, MD

## 2018-04-06 ENCOUNTER — Inpatient Hospital Stay: Payer: Medicare PPO

## 2018-04-06 ENCOUNTER — Encounter (INDEPENDENT_AMBULATORY_CARE_PROVIDER_SITE_OTHER): Payer: Self-pay

## 2018-04-06 ENCOUNTER — Ambulatory Visit (INDEPENDENT_AMBULATORY_CARE_PROVIDER_SITE_OTHER): Payer: Medicare PPO | Admitting: Pulmonary Disease

## 2018-04-06 ENCOUNTER — Ambulatory Visit
Admission: RE | Admit: 2018-04-06 | Discharge: 2018-04-06 | Disposition: A | Discharge status: Home / Self Care | Payer: Medicare PPO | Source: Ambulatory Visit | Attending: Radiation Oncology | Admitting: Radiation Oncology

## 2018-04-06 ENCOUNTER — Ambulatory Visit: Payer: Medicare PPO | Admitting: Pulmonary Disease

## 2018-04-06 ENCOUNTER — Telehealth (INDEPENDENT_AMBULATORY_CARE_PROVIDER_SITE_OTHER): Payer: Self-pay

## 2018-04-06 ENCOUNTER — Encounter: Payer: Self-pay | Admitting: Pulmonary Disease

## 2018-04-06 VITALS — BP 140/78 | HR 82 | Ht 72.0 in | Wt 156.6 lb

## 2018-04-06 DIAGNOSIS — C3491 Malignant neoplasm of unspecified part of right bronchus or lung: Secondary | ICD-10-CM | POA: Diagnosis not present

## 2018-04-06 DIAGNOSIS — C342 Malignant neoplasm of middle lobe, bronchus or lung: Secondary | ICD-10-CM | POA: Insufficient documentation

## 2018-04-06 DIAGNOSIS — J449 Chronic obstructive pulmonary disease, unspecified: Secondary | ICD-10-CM | POA: Diagnosis not present

## 2018-04-06 DIAGNOSIS — F17201 Nicotine dependence, unspecified, in remission: Secondary | ICD-10-CM | POA: Diagnosis not present

## 2018-04-06 MED ORDER — SPACER/AERO-HOLDING CHAMBERS DEVI: 1.0000 | Freq: Every day | 0 refills | Status: DC

## 2018-04-06 NOTE — Telephone Encounter (Signed)
Attempted to contact the patient to get him scheduled for his port placement. I left a message for a return call.

## 2018-04-06 NOTE — Progress Notes (Signed)
   Subjective:    Patient ID: Philip Weiss, male    DOB: 06-22-1945, 72 y.o.   MRN: [id number]  HPI The patient is a 72 year old recent former smoker smoker (10/23), who presents today after undergoing bronchoscopy and endobronchial ultrasound with [facility] on 22 October. The patient has been diagnosed with non-small cell carcinoma of the lung, favor adenocarcinoma. He had bulky sub carinal mass that was quite necrotic. This was also positive for non-small cell carcinoma. Does not note any issues after bronchoscopy. He has not had any hemoptysis. He continues to have a mild nonproductive cough. He has had some dyspnea but not debilitating. He uses Symbicort but is not sure if this is getting well deep into his lungs. He is to have an evaluation for placement of a porta cath. He is getting cancer treatment under the direction of Dr. Janese Banks.   Review of Systems  Constitutional: Negative.   HENT: Negative.   Eyes: Negative.   Respiratory: Positive for cough and shortness of breath (Mild).   Cardiovascular: Negative.   Gastrointestinal: Negative.   Hematological: Negative.   All other systems reviewed and are negative.      Objective:   Physical Exam  Constitutional: He is oriented to person, place, and time. He appears well-developed. No distress.  Thin, African-American gentleman.  HENT:  Head: Normocephalic and atraumatic.  Mouth/Throat: Oropharynx is clear and moist. No oropharyngeal exudate.  Poor dentition/missing teeth  Eyes: Pupils are equal, round, and reactive to light. Conjunctivae are normal. No scleral icterus.  Neck: Neck supple. No JVD present. No tracheal deviation present. No thyromegaly present.  Cardiovascular: Normal rate, regular rhythm, normal heart sounds and intact distal pulses.  Pulmonary/Chest: Effort normal. He has wheezes.  Few rhonchi noted as well.  Abdominal: Soft. He exhibits no distension.  Musculoskeletal: Normal range of motion. He exhibits no edema or  deformity.  Lymphadenopathy:    He has no cervical adenopathy.  Neurological: He is alert and oriented to person, place, and time.  No focal deficits.  Skin: Skin is warm and dry. He is not diaphoretic.  Psychiatric: He has a normal mood and affect. His behavior is normal.  Nursing note and vitals reviewed.         Assessment & Plan:   1) COPD: poorly compensated this may be related to poor deposition of his inhaler medication. He was provided with a spacer and instructions on how to use the inhaler appropriately. He is to you Symbicort 160/4.5, two inhalations twice a day with a spacer. We'll see him in follow-up in a month's time he is to contact us prior to that time should any new difficulties arise.  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 Janese Banks at the cancer center. Patient is to get porta cath for chemotherapy.  3) Tobacco use disorder, in early remission: the patient was commended on his discontinuation of smoking.

## 2018-04-06 NOTE — Patient Instructions (Signed)
1) Use Symbicort 2 puffs twice a day with spacer  2) We will see you in follow up in one month. Please call sooner for any new issues with regards to your breathing.

## 2018-04-07 ENCOUNTER — Ambulatory Visit
Admission: RE | Admit: 2018-04-07 | Discharge: 2018-04-07 | Disposition: A | Discharge status: Home / Self Care | Payer: Medicare PPO | Source: Ambulatory Visit | Attending: Oncology | Admitting: Oncology

## 2018-04-07 ENCOUNTER — Telehealth: Payer: Self-pay | Admitting: *Deleted

## 2018-04-07 ENCOUNTER — Inpatient Hospital Stay
Admission: EM | Admit: 2018-04-07 | Discharge: 2018-04-08 | DRG: 065 | Disposition: A | Discharge status: Home / Self Care | Payer: Medicare PPO | Attending: Internal Medicine | Admitting: Internal Medicine

## 2018-04-07 ENCOUNTER — Encounter: Payer: Self-pay | Admitting: *Deleted

## 2018-04-07 ENCOUNTER — Other Ambulatory Visit: Payer: Self-pay

## 2018-04-07 DIAGNOSIS — R29700 NIHSS score 0: Secondary | ICD-10-CM | POA: Diagnosis present

## 2018-04-07 DIAGNOSIS — I6389 Other cerebral infarction: Secondary | ICD-10-CM | POA: Insufficient documentation

## 2018-04-07 DIAGNOSIS — I639 Cerebral infarction, unspecified: Secondary | ICD-10-CM | POA: Diagnosis present

## 2018-04-07 DIAGNOSIS — C342 Malignant neoplasm of middle lobe, bronchus or lung: Secondary | ICD-10-CM | POA: Diagnosis present

## 2018-04-07 DIAGNOSIS — I10 Essential (primary) hypertension: Secondary | ICD-10-CM | POA: Diagnosis present

## 2018-04-07 DIAGNOSIS — Z79899 Other long term (current) drug therapy: Secondary | ICD-10-CM | POA: Diagnosis not present

## 2018-04-07 DIAGNOSIS — R918 Other nonspecific abnormal finding of lung field: Secondary | ICD-10-CM

## 2018-04-07 DIAGNOSIS — Z87891 Personal history of nicotine dependence: Secondary | ICD-10-CM | POA: Diagnosis not present

## 2018-04-07 DIAGNOSIS — R7301 Impaired fasting glucose: Secondary | ICD-10-CM | POA: Diagnosis present

## 2018-04-07 DIAGNOSIS — Z66 Do not resuscitate: Secondary | ICD-10-CM | POA: Diagnosis present

## 2018-04-07 DIAGNOSIS — G319 Degenerative disease of nervous system, unspecified: Secondary | ICD-10-CM

## 2018-04-07 DIAGNOSIS — I739 Peripheral vascular disease, unspecified: Secondary | ICD-10-CM

## 2018-04-07 DIAGNOSIS — J849 Interstitial pulmonary disease, unspecified: Secondary | ICD-10-CM | POA: Diagnosis present

## 2018-04-07 DIAGNOSIS — Z886 Allergy status to analgesic agent status: Secondary | ICD-10-CM | POA: Diagnosis not present

## 2018-04-07 HISTORY — DX: Malignant neoplasm of unspecified part of unspecified bronchus or lung: C34.90

## 2018-04-07 LAB — COMPREHENSIVE METABOLIC PANEL
ALBUMIN: 2.7 g/dL — AB (ref 3.5–5.0)
ALT: 32 U/L (ref 0–44)
AST: 20 U/L (ref 15–41)
Alkaline Phosphatase: 67 U/L (ref 38–126)
Anion gap: 9 (ref 5–15)
BUN: 9 mg/dL (ref 8–23)
CHLORIDE: 102 mmol/L (ref 98–111)
CO2: 21 mmol/L — AB (ref 22–32)
CREATININE: 0.81 mg/dL (ref 0.61–1.24)
Calcium: 8.4 mg/dL — ABNORMAL LOW (ref 8.9–10.3)
GFR calc non Af Amer: >60 mL/min (ref >60)
Glucose, Bld: 156 mg/dL — ABNORMAL HIGH (ref 70–99)
Potassium: 3.7 mmol/L (ref 3.5–5.1)
SODIUM: 132 mmol/L — AB (ref 135–145)
Total Bilirubin: 0.7 mg/dL (ref 0.3–1.2)
Total Protein: 7.1 g/dL (ref 6.5–8.1)

## 2018-04-07 LAB — CBC WITH DIFFERENTIAL/PLATELET
Abs Immature Granulocytes: 0.11 10*3/uL — ABNORMAL HIGH (ref 0.00–0.07)
BASOS ABS: 0.1 10*3/uL (ref 0.0–0.1)
Basophils Relative: 0 %
EOS PCT: 5 %
Eosinophils Absolute: 0.6 10*3/uL — ABNORMAL HIGH (ref 0.0–0.5)
HEMATOCRIT: 30.1 % — AB (ref 39.0–52.0)
HEMOGLOBIN: 10.3 g/dL — AB (ref 13.0–17.0)
IMMATURE GRANULOCYTES: 1 %
LYMPHS ABS: 1.7 10*3/uL (ref 0.7–4.0)
LYMPHS PCT: 14 %
MCH: 32.8 pg (ref 26.0–34.0)
MCHC: 34.2 g/dL (ref 30.0–36.0)
MCV: 95.9 fL (ref 80.0–100.0)
Monocytes Absolute: 1.4 10*3/uL — ABNORMAL HIGH (ref 0.1–1.0)
Monocytes Relative: 11 %
NRBC: 0 % (ref 0.0–0.2)
Neutro Abs: 8.4 10*3/uL — ABNORMAL HIGH (ref 1.7–7.7)
Neutrophils Relative %: 69 %
Platelets: 303 10*3/uL (ref 150–400)
RBC: 3.14 MIL/uL — ABNORMAL LOW (ref 4.22–5.81)
RDW: 13.2 % (ref 11.5–15.5)
WBC: 12.2 10*3/uL — ABNORMAL HIGH (ref 4.0–10.5)

## 2018-04-07 LAB — HEMOGLOBIN A1C
Hgb A1c MFr Bld: 5.7 % — ABNORMAL HIGH (ref 4.8–5.6)
MEAN PLASMA GLUCOSE: 116.89 mg/dL

## 2018-04-07 LAB — PROTIME-INR
INR: 1.21
Prothrombin Time: 15.2 seconds (ref 11.4–15.2)

## 2018-04-07 LAB — APTT: APTT: 34 s (ref 24–36)

## 2018-04-07 MED ORDER — IPRATROPIUM-ALBUTEROL 0.5-2.5 (3) MG/3ML IN SOLN
3.0000 mL | Freq: Four times a day (QID) | RESPIRATORY_TRACT | Status: DC
  Administered 2018-04-07 – 2018-04-08 (×3): 3 mL via RESPIRATORY_TRACT
  Filled 2018-04-07 (×3): qty 3

## 2018-04-07 MED ORDER — ATORVASTATIN CALCIUM 20 MG PO TABS
40.0000 mg | ORAL_TABLET | Freq: Every day | ORAL | Status: DC
  Administered 2018-04-07: 18:00:00 40 mg via ORAL
  Filled 2018-04-07: qty 2

## 2018-04-07 MED ORDER — VITAMIN B-6 50 MG PO TABS
50.0000 mg | ORAL_TABLET | Freq: Every day | ORAL | Status: DC
  Administered 2018-04-08: 50 mg via ORAL
  Filled 2018-04-07: qty 1

## 2018-04-07 MED ORDER — ENOXAPARIN SODIUM 40 MG/0.4ML ~~LOC~~ SOLN
40.0000 mg | SUBCUTANEOUS | Status: DC
  Administered 2018-04-07: 40 mg via SUBCUTANEOUS
  Filled 2018-04-07: qty 0.4

## 2018-04-07 MED ORDER — ONDANSETRON HCL 4 MG PO TABS: 4.0000 mg | ORAL_TABLET | Freq: Four times a day (QID) | ORAL | Status: DC | PRN

## 2018-04-07 MED ORDER — GADOBENATE DIMEGLUMINE 529 MG/ML IV SOLN: 7.0000 mL | Freq: Once | INTRAVENOUS | Status: DC | PRN

## 2018-04-07 MED ORDER — VITAMIN B-12 100 MCG PO TABS
100.0000 ug | ORAL_TABLET | Freq: Every day | ORAL | Status: DC
  Administered 2018-04-08: 100 ug via ORAL
  Filled 2018-04-07: qty 1

## 2018-04-07 MED ORDER — ASPIRIN EC 81 MG PO TBEC
81.0000 mg | DELAYED_RELEASE_TABLET | Freq: Every day | ORAL | Status: DC
  Administered 2018-04-08: 10:00:00 81 mg via ORAL
  Filled 2018-04-07: qty 1

## 2018-04-07 MED ORDER — ACETAMINOPHEN 325 MG PO TABS
650.0000 mg | ORAL_TABLET | Freq: Four times a day (QID) | ORAL | Status: DC | PRN
  Filled 2018-04-07: qty 2

## 2018-04-07 MED ORDER — ADULT MULTIVITAMIN W/MINERALS CH
ORAL_TABLET | Freq: Every day | ORAL | Status: DC
  Administered 2018-04-08: 1 via ORAL
  Filled 2018-04-07: qty 1

## 2018-04-07 MED ORDER — MOMETASONE FURO-FORMOTEROL FUM 200-5 MCG/ACT IN AERO
2.0000 | INHALATION_SPRAY | Freq: Two times a day (BID) | RESPIRATORY_TRACT | Status: DC
  Administered 2018-04-07 – 2018-04-08 (×2): 2 via RESPIRATORY_TRACT
  Filled 2018-04-07: qty 8.8

## 2018-04-07 MED ORDER — ASPIRIN 81 MG PO CHEW
324.0000 mg | CHEWABLE_TABLET | Freq: Once | ORAL | Status: DC
  Filled 2018-04-07: qty 4

## 2018-04-07 MED ORDER — GADOBUTROL 1 MMOL/ML IV SOLN
7.0000 mL | Freq: Once | INTRAVENOUS | Status: AC | PRN
  Administered 2018-04-07: 7 mL via INTRAVENOUS

## 2018-04-07 MED ORDER — STROKE: EARLY STAGES OF RECOVERY BOOK
Freq: Once | Status: AC
  Administered 2018-04-07: 18:00:00

## 2018-04-07 MED ORDER — ONDANSETRON HCL 4 MG/2ML IJ SOLN: 4.0000 mg | Freq: Four times a day (QID) | INTRAMUSCULAR | Status: DC | PRN

## 2018-04-07 MED ORDER — ASPIRIN 81 MG PO CHEW
324.0000 mg | CHEWABLE_TABLET | Freq: Once | ORAL | Status: AC
  Administered 2018-04-07: 18:00:00 324 mg via ORAL
  Filled 2018-04-07: qty 4

## 2018-04-07 MED ORDER — OXYCODONE HCL 5 MG PO TABS
5.0000 mg | ORAL_TABLET | Freq: Four times a day (QID) | ORAL | Status: DC | PRN
  Administered 2018-04-07 – 2018-04-08 (×2): 5 mg via ORAL
  Filled 2018-04-07 (×2): qty 1

## 2018-04-07 MED ORDER — ACETAMINOPHEN 650 MG RE SUPP: 650.0000 mg | Freq: Four times a day (QID) | RECTAL | Status: DC | PRN

## 2018-04-07 NOTE — Telephone Encounter (Signed)
Pt notified of results from Brain MRI. Per Neurology and Dr. Janese Banks, it is recommended for pt seek further evaluation at the ED. Spoke with pt and his wife and gave md recommendations. Pt and his wife verbalized understanding.

## 2018-04-07 NOTE — ED Notes (Signed)
[place] doctor at pt's bedside.

## 2018-04-07 NOTE — ED Triage Notes (Signed)
Pt to ED after an MRI this morning showed multiple strokes. Pt was unaware of having a stroke and has no neuro defecits at this time. MRI was performed to r/o metastasis of cancer.   Pt verbalized having headaches three weeks ago but was placed on blood pressure medications and has not had headaches since.

## 2018-04-07 NOTE — H&P (Signed)
[facility] NAME: Philip Weiss    MR#:  [id number]  DATE OF BIRTH:  [birth year]  DATE OF ADMISSION:  04/07/2018  PRIMARY CARE PHYSICIAN: [facility], [place]   REQUESTING/REFERRING PHYSICIAN: Dr. Conni Slipper  CHIEF COMPLAINT:  Sent in because MRI showed a stroke.  HISTORY OF PRESENT ILLNESS:  Philip Weiss  is a 72 y.o. male with a known history of recently diagnosed lung cancer non-small cell likely adenocarcinoma.  He had a routine MRI of the brain to rule out metastases and was found to have a stroke and was sent into the ER.  Hospitalist services were contacted for further work-up.  The patient states that he does not have any symptoms and feels okay.  He has had some weight loss.  Some cough.  No unsteady gait or difficulty walking or coordination issues.  No weakness one side versus the other.  PAST MEDICAL HISTORY:   Past Medical History:  Diagnosis Date  . Hypertension   . Lung cancer ([place])     PAST SURGICAL HISTORY:   Past Surgical History:  Procedure Laterality Date  . CYST EXCISION Left    buttock  . ENDOBRONCHIAL ULTRASOUND Right 03/29/2018   Procedure: ENDOBRONCHIAL ULTRASOUND;  Surgeon: Tyler Pita, MD;  Location: [facility] ORS;  Service: Cardiopulmonary;  Laterality: Right;  . FLEXIBLE BRONCHOSCOPY Right 03/29/2018   Procedure: FLEXIBLE BRONCHOSCOPY;  Surgeon: Tyler Pita, MD;  Location: [facility] OR[facility];  Service: Cardiopulmonary;  Laterality: Right;    SOCIAL HISTORY:   Social History   Tobacco Use  . Smoking status: Former Smoker    Packs/day: 1.00    Years: 58.00    Pack years: 58.00    Types: Cigarettes    Last attempt to quit: 03/30/2018    Years since quitting: 0.0  . Smokeless tobacco: Never Used  Substance Use Topics  . Alcohol use: Not Currently    Comment: daily 32 oz beer    FAMILY HISTORY:   Family History  Problem Relation Age of Onset  . Diabetes Mother   . CAD  Mother   . Stroke Father     DRUG ALLERGIES:   Allergies  Allergen Reactions  . Aspirin Itching    REVIEW OF SYSTEMS:  CONSTITUTIONAL: No fever, fatigue or weakness.  Some weight loss. EYES: No blurred or double vision.  EARS, NOSE, AND THROAT: No tinnitus or ear pain. No sore throat RESPIRATORY: Some cough.  No shortness of breath, wheezing or hemoptysis.  CARDIOVASCULAR: No chest pain, orthopnea, edema.  GASTROINTESTINAL: No nausea, vomiting, diarrhea or abdominal pain. No blood in bowel movements GENITOURINARY: No dysuria, hematuria.  ENDOCRINE: No polyuria, nocturia,  HEMATOLOGY: No anemia, easy bruising or bleeding SKIN: No rash or lesion. MUSCULOSKELETAL: No joint pain or arthritis.   NEUROLOGIC: No tingling, numbness, weakness.  PSYCHIATRY: No anxiety or depression.   MEDICATIONS AT HOME:   Prior to Admission medications   Medication Sig Start Date End Date Taking? Authorizing Provider  budesonide-formoterol (SYMBICORT) 160-4.5 MCG/ACT inhaler Inhale 2 puffs into the lungs 2 (two) times daily.   Yes [provider]  Cyanocobalamin (VITAMIN B-12 PO) Take 1 tablet by mouth daily.   Yes [provider]  losartan (COZAAR) 25 MG tablet Take 25 mg by mouth daily.  03/14/18  Yes [provider]  Multiple Vitamins-Minerals (MULTIVITAMIN PO) Take 1 tablet by mouth daily.   Yes [provider]  Pyridoxine HCl (VITAMIN B-6 PO) Take 1  tablet by mouth daily.   Yes [provider]  dexamethasone (DECADRON) 4 MG tablet Take 2 tablets (8 mg total) by mouth daily. Start the day after chemotherapy for 2 days. 04/05/18   Sindy Guadeloupe, MD  lidocaine-prilocaine (EMLA) cream Apply to affected area once 04/05/18   Sindy Guadeloupe, MD  LORazepam (ATIVAN) 0.5 MG tablet Take 1 tablet (0.5 mg total) by mouth every 6 (six) hours as needed (Nausea or vomiting). 04/05/18   Sindy Guadeloupe, MD  ondansetron (ZOFRAN) 8 MG tablet Take 1 tablet (8 mg total) by  mouth 2 (two) times daily as needed for refractory nausea / vomiting. Start on day 3 after chemo. 04/05/18   Sindy Guadeloupe, MD  prochlorperazine (COMPAZINE) 10 MG tablet Take 1 tablet (10 mg total) by mouth every 6 (six) hours as needed (Nausea or vomiting). 04/05/18   Sindy Guadeloupe, MD  Spacer/Aero-Holding Josiah Lobo DEVI 1 each by Does not apply route daily. 04/06/18   Tyler Pita, MD      VITAL SIGNS:  Blood pressure 130/66, pulse 97, temperature 98.6 F (37 C), temperature source Oral, resp. rate 16, height 6' (1.829 m), weight 71.3 kg, SpO2 96 %.  PHYSICAL EXAMINATION:  GENERAL:  72 y.o.-year-old patient lying in the bed with no acute distress.  EYES: Pupils equal, round, reactive to light and accommodation. No scleral icterus. Extraocular muscles intact.  HEENT: Head atraumatic, normocephalic. Oropharynx and nasopharynx clear.  NECK:  Supple, no jugular venous distention. No thyroid enlargement, no tenderness.  LUNGS: Decreased breath sounds bilaterally, positive rhonchi at the bases. No use of accessory muscles of respiration.  CARDIOVASCULAR: S1, S2 normal. No murmurs, rubs, or gallops.  ABDOMEN: Soft, nontender, nondistended. Bowel sounds present. No organomegaly or mass.  EXTREMITIES: No pedal edema, cyanosis, or clubbing.  NEUROLOGIC: Cranial nerves II through XII are intact. Muscle strength 5/5 in all extremities. Sensation intact. Gait not checked.  PSYCHIATRIC: The patient is alert and oriented x 3.  SKIN: No rash, lesion, or ulcer.   LABORATORY PANEL:   CBC Recent Labs  Lab 04/07/18 1559  WBC 12.2*  HGB 10.3*  HCT 30.1*  PLT 303   ------------------------------------------------------------------------------------------------------------------  Chemistries  Recent Labs  Lab 04/07/18 1559  NA 132*  K 3.7  CL 102  CO2 21*  GLUCOSE 156*  BUN 9  CREATININE 0.81  CALCIUM 8.4*  AST 20  ALT 32  ALKPHOS 67  BILITOT 0.7    ------------------------------------------------------------------------------------------------------------------    RADIOLOGY:  Mr Jeri Cos Wo Contrast  Result Date: 04/07/2018 CLINICAL DATA:  Lung cancer.  No symptoms.  Staging. EXAM: MRI HEAD WITHOUT AND WITH CONTRAST TECHNIQUE: Multiplanar, multiecho pulse sequences of the brain and surrounding structures were obtained without and with intravenous contrast. CONTRAST:  Gadavist 7 mL. COMPARISON:  PET scan 03/23/2018. FINDINGS: Brain: Two small foci of restricted diffusion, LEFT cerebellar cortex and white matter, corresponding low ADC, LEFT AICA territory, consistent with acute infarction. No underlying mass or vasogenic edema. No other similar findings. No hemorrhage, mass lesion, hydrocephalus, or extra-axial fluid. Premature for age atrophy. Chronic microvascular ischemic change of a mild-to-moderate nature affecting the white matter, primarily supratentorial. Post infusion imaging in all three planes demonstrates no abnormal enhancement of the brain or meninges. No intracranial metastatic disease is observed. Vascular: Flow voids are maintained. Specifically, the basilar artery, and both vertebral arteries, are patent. Cerebellar branches are not assessed as part of this examination, although a vessel in the expected region of the  LEFT AICA demonstrates a flow void. Skull and upper cervical spine: Unremarkable. Sinuses/Orbits: Negative. Other: None. IMPRESSION: Subcentimeter acute strokes LEFT cerebellum. The adjacent vertebral artery and basilar artery appear patent, so other causes of infarction such as atrial fibrillation or hypercoagulable state could be considered as an etiology. No supratentorial abnormalities are evident. Atrophy and small vessel disease. No intracranial metastatic disease is observed. These results were called by telephone at the time of interpretation on 04/07/2018 at 11:12 am to Dr. Astrid Divine RAO , who verbally  acknowledged these results. Electronically Signed   By: Staci Righter M.D.   On: 04/07/2018 11:14    EKG:   Ordered by me  IMPRESSION AND PLAN:   1. Acute left cerebellum subcentimeter strokes.  Start aspirin and Lipitor.  Get echocardiogram with bubble study, carotid ultrasound and hypercoagulable work-up.  Physical therapy, Occupational Therapy and neurology consultations. 2.  Hypertension.  Hold losartan at this time. 3.  Recently diagnosed adenocarcinoma of the lung. 4.  Interstitial lung disease seen on previous CT scan.  Start DuoNeb nebulizer solution and continue Symbicort.  5.  Impaired fasting glucose.  Check a hemoglobin A1c   All the records are reviewed and case discussed with ED provider. Management plans discussed with the patient, family and they are in agreement.  CODE STATUS: DNR  TOTAL TIME TAKING CARE OF THIS PATIENT: 50 minutes, including acp time.    Loletha Grayer M.D on 04/07/2018 at 4:52 PM  Between 7am to 6pm - Pager - [phone]  After 6pm call admission pager [phone]  [facility]  [phone]  CC: Primary care physician; [facility]

## 2018-04-07 NOTE — ED Provider Notes (Signed)
[facility] Emergency Department Provider Note   ____________________________________________   First MD Initiated Contact with Patient 04/07/18 1600     (approximate)  I have reviewed the triage vital signs and the nursing notes.   HISTORY  Chief Complaint No chief complaint on file. Chief complaint is sent by cancer center because MRI done for cancer screening shows acute strokes   HPI Philip Weiss is a 72 y.o. male who is being treated at the cancer center for lung cancer.  Had an MRI as part of his evaluation today.  MRI shows several subcentimeter acute strokes in the cerebellum.  Patient was sent here for evaluation.  Patient has no symptoms except for daily headaches.   Past Medical History:  Diagnosis Date  . Hypertension   . Mass of middle lobe of right lung 03/29/2018    Patient Active Problem List   Diagnosis Date Noted  . Lung cancer ([place]) 04/05/2018  . Mass of middle lobe of right lung 03/29/2018  . Mediastinal mass     Past Surgical History:  Procedure Laterality Date  . CYST EXCISION Left    buttock  . ENDOBRONCHIAL ULTRASOUND Right 03/29/2018   Procedure: ENDOBRONCHIAL ULTRASOUND;  Surgeon: Tyler Pita, MD;  Location: [facility] ORS;  Service: Cardiopulmonary;  Laterality: Right;  . FLEXIBLE BRONCHOSCOPY Right 03/29/2018   Procedure: FLEXIBLE BRONCHOSCOPY;  Surgeon: Tyler Pita, MD;  Location: [facility] ORS;  Service: Cardiopulmonary;  Laterality: Right;    Prior to Admission medications   Medication Sig Start Date End Date Taking? Authorizing Provider  budesonide-formoterol (SYMBICORT) 160-4.5 MCG/ACT inhaler Inhale 2 puffs into the lungs 2 (two) times daily.    [provider]  Cyanocobalamin (VITAMIN B-12 PO) Take 1 tablet by mouth daily.    [provider]  dexamethasone (DECADRON) 4 MG tablet Take 2 tablets (8 mg total) by mouth daily. Start the day after chemotherapy for 2 days. 04/05/18   Sindy Guadeloupe, MD  lidocaine-prilocaine (EMLA) cream Apply to affected area once 04/05/18   Sindy Guadeloupe, MD  LORazepam (ATIVAN) 0.5 MG tablet Take 1 tablet (0.5 mg total) by mouth every 6 (six) hours as needed (Nausea or vomiting). 04/05/18   Sindy Guadeloupe, MD  losartan (COZAAR) 25 MG tablet Take 25 mg by mouth daily.  03/14/18   [provider]  Multiple Vitamins-Minerals (MULTIVITAMIN PO) Take 1 tablet by mouth daily.    [provider]  ondansetron (ZOFRAN) 8 MG tablet Take 1 tablet (8 mg total) by mouth 2 (two) times daily as needed for refractory nausea / vomiting. Start on day 3 after chemo. 04/05/18   Sindy Guadeloupe, MD  prochlorperazine (COMPAZINE) 10 MG tablet Take 1 tablet (10 mg total) by mouth every 6 (six) hours as needed (Nausea or vomiting). 04/05/18   Sindy Guadeloupe, MD  Pyridoxine HCl (VITAMIN B-6 PO) Take 1 tablet by mouth daily.    [provider]  Spacer/Aero-Holding Chambers DEVI 1 each by Does not apply route daily. 04/06/18   Tyler Pita, MD    Allergies Patient has no known allergies.  Family History  Problem Relation Age of Onset  . Diabetes Mother   . Stroke Father     Social History Social History   Tobacco Use  . Smoking status: Former Smoker    Packs/day: 1.00    Years: 58.00    Pack years: 58.00    Types: Cigarettes    Last attempt to quit:  03/30/2018    Years since quitting: 0.0  . Smokeless tobacco: Never Used  Substance Use Topics  . Alcohol use: Yes    Comment: daily 32 oz beer  . Drug use: Not Currently    Review of Systems  Constitutional: No fever/chills Eyes: No visual changes. ENT: No sore throat. Cardiovascular: Denies chest pain. Respiratory: Denies shortness of breath. Gastrointestinal: No abdominal pain.  No nausea, no vomiting.  No diarrhea.  No constipation. Genitourinary: Negative for dysuria. Musculoskeletal: Negative for back pain. Skin: Negative for rash. Neurological: Negative for  headaches, focal weakness  ____________________________________________   PHYSICAL EXAM:  VITAL SIGNS: ED Triage Vitals [04/07/18 1548]  Enc Vitals Group     BP 130/66     Pulse Rate 97     Resp 16     Temp 98.6 F (37 C)     Temp Source Oral     SpO2 96 %     Weight 157 lb 3 oz (71.3 kg)     Height 6' (1.829 m)     Head Circumference      Peak Flow      Pain Score 0     Pain Loc      Pain Edu?      Excl. in [place]?     Constitutional: Alert and oriented. Well appearing and in no acute distress. Eyes: Conjunctivae are normal. PERRL. EOMI. Head: Atraumatic. Nose: No congestion/rhinnorhea. Mouth/Throat: Mucous membranes are moist.  Oropharynx non-erythematous. Neck: No stridor. Cardiovascular: Normal rate, regular rhythm. Grossly normal heart sounds.  Good peripheral circulation. Respiratory: Normal respiratory effort.  No retractions. Lungs CTAB. Gastrointestinal: Soft and nontender. No distention. No abdominal bruits. No CVA tenderness. Musculoskeletal: No lower extremity tenderness nor edema.  Neurologic:  Normal speech and language. No gross focal neurologic deficits are appreciated.  Specifically cranial nerves II through XII are intact although visual fields were not checked cerebellar finger-to-nose heel-to-shin and rapid alternating movements and hands are normal motor strength is 5/5 throughout patient does not report any numbness Skin:  Skin is warm, dry and intact. No rash noted. Psychiatric: Mood and affect are normal. Speech and behavior are normal.  ____________________________________________   LABS (all labs ordered are listed, but only abnormal results are displayed)  Labs Reviewed  COMPREHENSIVE METABOLIC PANEL  CBC WITH DIFFERENTIAL/PLATELET  PROTIME-INR  APTT   ____________________________________________  EKG   ____________________________________________  RADIOLOGY  ED MD interpretation: MRI read by radiology as several subacute centimeter  acute strokes in the left cerebellum  Official radiology report(s): Mr Jeri Cos Wo Contrast  Result Date: 04/07/2018 CLINICAL DATA:  Lung cancer.  No symptoms.  Staging. EXAM: MRI HEAD WITHOUT AND WITH CONTRAST TECHNIQUE: Multiplanar, multiecho pulse sequences of the brain and surrounding structures were obtained without and with intravenous contrast. CONTRAST:  Gadavist 7 mL. COMPARISON:  PET scan 03/23/2018. FINDINGS: Brain: Two small foci of restricted diffusion, LEFT cerebellar cortex and white matter, corresponding low ADC, LEFT AICA territory, consistent with acute infarction. No underlying mass or vasogenic edema. No other similar findings. No hemorrhage, mass lesion, hydrocephalus, or extra-axial fluid. Premature for age atrophy. Chronic microvascular ischemic change of a mild-to-moderate nature affecting the white matter, primarily supratentorial. Post infusion imaging in all three planes demonstrates no abnormal enhancement of the brain or meninges. No intracranial metastatic disease is observed. Vascular: Flow voids are maintained. Specifically, the basilar artery, and both vertebral arteries, are patent. Cerebellar branches are not assessed as part of this examination, although a vessel in  the expected region of the LEFT AICA demonstrates a flow void. Skull and upper cervical spine: Unremarkable. Sinuses/Orbits: Negative. Other: None. IMPRESSION: Subcentimeter acute strokes LEFT cerebellum. The adjacent vertebral artery and basilar artery appear patent, so other causes of infarction such as atrial fibrillation or hypercoagulable state could be considered as an etiology. No supratentorial abnormalities are evident. Atrophy and small vessel disease. No intracranial metastatic disease is observed. These results were called by telephone at the time of interpretation on 04/07/2018 at 11:12 am to Dr. Astrid Divine RAO , who verbally acknowledged these results. Electronically Signed   By: Staci Righter M.D.    On: 04/07/2018 11:14    ____________________________________________   PROCEDURES  Procedure(s) performed:   Procedures  Critical Care performed:   ____________________________________________   INITIAL IMPRESSION / ASSESSMENT AND PLAN / ED COURSE  Discussed patient with Dr. Doy Mince.  Patient has no symptoms.  We will start him on aspirin and admit him.         ____________________________________________   FINAL CLINICAL IMPRESSION(S) / ED DIAGNOSES  Final diagnoses:  Cerebrovascular accident (CVA), unspecified mechanism [facility])     ED Discharge Orders    None       Note:  This document was prepared using Dragon voice recognition software and may include unintentional dictation errors.    Nena Polio, MD 04/07/18 [phone]

## 2018-04-07 NOTE — Progress Notes (Signed)
Patient ID: Philip Weiss, male   DOB: 12-07-1945, 72 y.o.   MRN: [id number]  ACP note  Patient, wife and daughter at the bedside  Diagnosis acute stroke, adenocarcinoma of the lung, hypertension, interstitial lung disease  CODE STATUS discussed and patient wishes to be a DO NOT RESUSCITATE  Plan.  Get stroke work-up including hypercoagulable work-up echocardiogram, telemetry monitoring and carotid ultrasound.  Start aspirin and Lipitor  Time spent on ACP discussion 17 minutes Dr. Loletha Grayer

## 2018-04-08 ENCOUNTER — Encounter: Payer: Self-pay | Admitting: Radiology

## 2018-04-08 ENCOUNTER — Inpatient Hospital Stay
Admit: 2018-04-08 | Discharge: 2018-04-08 | Disposition: A | Discharge status: Home / Self Care | Payer: Medicare PPO | Attending: Internal Medicine | Admitting: Internal Medicine

## 2018-04-08 ENCOUNTER — Inpatient Hospital Stay: Payer: Medicare PPO

## 2018-04-08 DIAGNOSIS — I639 Cerebral infarction, unspecified: Principal | ICD-10-CM

## 2018-04-08 LAB — BASIC METABOLIC PANEL
Anion gap: 7 (ref 5–15)
BUN: 7 mg/dL — AB (ref 8–23)
CHLORIDE: 104 mmol/L (ref 98–111)
CO2: 23 mmol/L (ref 22–32)
CREATININE: 0.82 mg/dL (ref 0.61–1.24)
Calcium: 8.2 mg/dL — ABNORMAL LOW (ref 8.9–10.3)
GFR calc non Af Amer: >60 mL/min (ref >60)
Glucose, Bld: 115 mg/dL — ABNORMAL HIGH (ref 70–99)
Potassium: 3.9 mmol/L (ref 3.5–5.1)
Sodium: 134 mmol/L — ABNORMAL LOW (ref 135–145)

## 2018-04-08 LAB — CBC
HEMATOCRIT: 30 % — AB (ref 39.0–52.0)
Hemoglobin: 9.9 g/dL — ABNORMAL LOW (ref 13.0–17.0)
MCH: 32.1 pg (ref 26.0–34.0)
MCHC: 33 g/dL (ref 30.0–36.0)
MCV: 97.4 fL (ref 80.0–100.0)
NRBC: 0 % (ref 0.0–0.2)
Platelets: 289 10*3/uL (ref 150–400)
RBC: 3.08 MIL/uL — ABNORMAL LOW (ref 4.22–5.81)
RDW: 13.2 % (ref 11.5–15.5)
WBC: 10.3 10*3/uL (ref 4.0–10.5)

## 2018-04-08 LAB — ANTITHROMBIN III: ANTITHROMB III FUNC: 82 % (ref 75–120)

## 2018-04-08 LAB — LIPID PANEL
CHOL/HDL RATIO: 5.7 ratio
CHOLESTEROL: 147 mg/dL (ref 0–200)
HDL: 26 mg/dL — AB (ref >40)
LDL Cholesterol: 101 mg/dL — ABNORMAL HIGH (ref 0–99)
Triglycerides: 100 mg/dL (ref <150)
VLDL: 20 mg/dL (ref 0–40)

## 2018-04-08 LAB — CARDIOLIPIN ANTIBODIES, IGG, IGM, IGA: ANTICARDIOLIPIN IGM: 10 [MPL'U]/mL (ref 0–12)

## 2018-04-08 LAB — HOMOCYSTEINE: Homocysteine: 16.9 umol/L — ABNORMAL HIGH (ref 0.0–15.0)

## 2018-04-08 MED ORDER — ATORVASTATIN CALCIUM 40 MG PO TABS: 40.0000 mg | ORAL_TABLET | Freq: Every day | ORAL | 1 refills | Status: AC

## 2018-04-08 MED ORDER — IOPAMIDOL (ISOVUE-370) INJECTION 76%
75.0000 mL | Freq: Once | INTRAVENOUS | Status: AC | PRN
  Administered 2018-04-08: 13:00:00 75 mL via INTRAVENOUS

## 2018-04-08 MED ORDER — ASPIRIN 81 MG PO TBEC: 81.0000 mg | DELAYED_RELEASE_TABLET | Freq: Every day | ORAL | 1 refills | Status: AC

## 2018-04-08 NOTE — Discharge Summary (Signed)
[facility] [place] NAME: Philip Weiss    MR#:  [id number]  DATE OF BIRTH:  [birth year]  DATE OF ADMISSION:  04/07/2018 ADMITTING PHYSICIAN: Loletha Grayer, MD  DATE OF DISCHARGE:04/08/2018  PRIMARY CARE PHYSICIAN: [facility] [place]    ADMISSION DIAGNOSIS:  CVA (cerebral vascular accident) [facility]) [I63.9] Cerebrovascular accident (CVA), unspecified mechanism ([place]) [I63.9]  DISCHARGE DIAGNOSIS:  Left cerebellar CVA Lung cancer--f/u at the cancer center SECONDARY DIAGNOSIS:   Past Medical History:  Diagnosis Date  . Hypertension   . Lung cancer [facility])     HOSPITAL COURSE:  Philip Weiss  is a 72 y.o. male with a known history of recently diagnosed lung cancer non-small cell likely adenocarcinoma.  He had a routine MRI of the brain to rule out metastases and was found to have a stroke and was sent into the ER  1. Acute left cerebellum subcentimeter strokes. -cont aspirin and Lipitor.  Get echocardiogram with bubble study--negative  - carotid ultrasound WNL -patient is ambulatory. -Neurology consultation appreciated. Patient has no neural deficits doing very well.  2.  Hypertension.   resume losartan   3.  Recently diagnosed adenocarcinoma of the lung-- up at the cancer center on your routine appointment  4.  Interstitial lung disease seen on previous CT scan.  Start DuoNeb nebulizer solution and continue Symbicort.   5.  Impaired fasting glucose.   hemoglobin A1c-- 5.7  Overall hemodynamically stable. Patient will discharged to home. Discussed with family. CONSULTS OBTAINED:  Treatment Team:  Alexis Goodell, MD  DRUG ALLERGIES:   Allergies  Allergen Reactions  . Aspirin Itching    DISCHARGE MEDICATIONS:   Allergies as of 04/08/2018      Reactions   Aspirin Itching      Medication List    STOP taking these medications   lidocaine-prilocaine cream Commonly known as:  EMLA     TAKE these  medications   aspirin 81 MG EC tablet Take 1 tablet (81 mg total) by mouth daily. Start taking on:  04/09/2018   atorvastatin 40 MG tablet Commonly known as:  LIPITOR Take 1 tablet (40 mg total) by mouth daily at 6 PM.   budesonide-formoterol 160-4.5 MCG/ACT inhaler Commonly known as:  SYMBICORT Inhale 2 puffs into the lungs 2 (two) times daily.   dexamethasone 4 MG tablet Commonly known as:  DECADRON Take 2 tablets (8 mg total) by mouth daily. Start the day after chemotherapy for 2 days.   LORazepam 0.5 MG tablet Commonly known as:  ATIVAN Take 1 tablet (0.5 mg total) by mouth every 6 (six) hours as needed (Nausea or vomiting).   losartan 25 MG tablet Commonly known as:  COZAAR Take 25 mg by mouth daily.   MULTIVITAMIN PO Take 1 tablet by mouth daily.   ondansetron 8 MG tablet Commonly known as:  ZOFRAN Take 1 tablet (8 mg total) by mouth 2 (two) times daily as needed for refractory nausea / vomiting. Start on day 3 after chemo.   prochlorperazine 10 MG tablet Commonly known as:  COMPAZINE Take 1 tablet (10 mg total) by mouth every 6 (six) hours as needed (Nausea or vomiting).   Spacer/Aero-Holding Dorise Bullion 1 each by Does not apply route daily.   VITAMIN B-12 PO Take 1 tablet by mouth daily.   VITAMIN B-6 PO Take 1 tablet by mouth daily.       If you experience worsening of your admission symptoms, develop shortness of breath,  life threatening emergency, suicidal or homicidal thoughts you must seek medical attention immediately by calling 911 or calling your MD immediately  if symptoms less severe.  You Must read complete instructions/literature along with all the possible adverse reactions/side effects for all the Medicines you take and that have been prescribed to you. Take any new Medicines after you have completely understood and accept all the possible adverse reactions/side effects.   Please note  You were cared for by a hospitalist during your  hospital stay. If you have any questions about your discharge medications or the care you received while you were in the hospital after you are discharged, you can call the unit and asked to speak with the hospitalist on call if the hospitalist that took care of you is not available. Once you are discharged, your primary care physician will handle any further medical issues. Please note that NO REFILLS for any discharge medications will be authorized once you are discharged, as it is imperative that you return to your primary care physician (or establish a relationship with a primary care physician if you do not have one) for your aftercare needs so that they can reassess your need for medications and monitor your lab values. Today   SUBJECTIVE   Doing well  VITAL SIGNS:  Blood pressure 120/64, pulse 84, temperature (!) 97.3 F (36.3 C), temperature source Oral, resp. rate 20, height 6' (1.829 m), weight 71.3 kg, SpO2 98 %.  I/O:  No intake or output data in the 24 hours ending 04/08/18 1513  PHYSICAL EXAMINATION:  GENERAL:  72 y.o.-year-old patient lying in the bed with no acute distress. Thin, cachectic EYES: Pupils equal, round, reactive to light and accommodation. No scleral icterus. Extraocular muscles intact.  HEENT: Head atraumatic, normocephalic. Oropharynx and nasopharynx clear.  NECK:  Supple, no jugular venous distention. No thyroid enlargement, no tenderness.  LUNGS: Normal breath sounds bilaterally, no wheezing, rales,rhonchi or crepitation. No use of accessory muscles of respiration.  CARDIOVASCULAR: S1, S2 normal. No murmurs, rubs, or gallops.  ABDOMEN: Soft, non-tender, non-distended. Bowel sounds present. No organomegaly or mass.  EXTREMITIES: No pedal edema, cyanosis, or clubbing.  NEUROLOGIC: Cranial nerves II through XII are intact. Muscle strength 5/5 in all extremities. Sensation intact. Gait not checked.  PSYCHIATRIC: The patient is alert and oriented x 3.  SKIN: No  obvious rash, lesion, or ulcer.   DATA REVIEW:   CBC  Recent Labs  Lab 04/08/18 0451  WBC 10.3  HGB 9.9*  HCT 30.0*  PLT 289    Chemistries  Recent Labs  Lab 04/07/18 1559 04/08/18 0451  NA 132* 134*  K 3.7 3.9  CL 102 104  CO2 21* 23  GLUCOSE 156* 115*  BUN 9 7*  CREATININE 0.81 0.82  CALCIUM 8.4* 8.2*  AST 20  --   ALT 32  --   ALKPHOS 67  --   BILITOT 0.7  --     Microbiology Results   No results found for this or any previous visit (from the past 240 hour(s)).  RADIOLOGY:  Ct Angio Head W Or Wo Contrast  Result Date: 04/08/2018 CLINICAL DATA:  Stroke follow-up EXAM: CT ANGIOGRAPHY HEAD AND NECK TECHNIQUE: Multidetector CT imaging of the head and neck was performed using the standard protocol during bolus administration of intravenous contrast. Multiplanar CT image reconstructions and MIPs were obtained to evaluate the vascular anatomy. Carotid stenosis measurements (when applicable) are obtained utilizing NASCET criteria, using the distal internal carotid diameter as  the denominator. CONTRAST:  61mL ISOVUE-370 IOPAMIDOL (ISOVUE-370) INJECTION 76% COMPARISON:  Brain MRI from yesterday. FINDINGS: CT HEAD FINDINGS Brain: Acute infarcts are not clearly visible. No evidence of progressive infarct. No hemorrhage or hydrocephalus. Vascular: See below Skull: Negative Sinuses: Negative Orbits: Negative Review of the MIP images confirms the above findings CTA NECK FINDINGS Aortic arch: Mild atherosclerotic plaque.  Three vessel branching. Right carotid system: No notable atheromatous changes. No beading or dissection. Left carotid system: The vessels are smooth and widely patent. No notable atheromatous changes. Vertebral arteries: No proximal subclavian stenosis or significant atherosclerosis. There is mild to moderate narrowing at the origin of the right vertebral artery. Otherwise the vertebral arteries are smooth and widely patent. Skeleton: No acute or aggressive finding.  Chronic superior endplate fractures of T1, T2, and T3. Osteopenia and cervical disc degeneration. Other neck: No significant finding Upper chest: Known intrathoracic malignancy with subcarinal adenopathy and a right pleural effusion that has developed since prior. Emphysema. Review of the MIP images confirms the above findings CTA HEAD FINDINGS Anterior circulation: Moderate right M2 branch narrowing, presumably atheromatous. No branch occlusion or aneurysm. Posterior circulation: Codominant vertebral arteries. Hypoplastic right P1 segment. The vertebral and basilar arteries are smooth and diffusely patent. No branch occlusion or flow limiting stenosis. Venous sinuses: Patent Anatomic variants: As above Delayed phase: No abnormal intracranial enhancement Review of the MIP images confirms the above findings IMPRESSION: 1. No emergent finding. 2. Mild for age atherosclerosis in the neck without flow limiting stenosis or evidence embolic source. 3. Known lung cancer. There is a right pleural effusion that was not seen on 03/23/2018 chest CT. Electronically Signed   By: Monte Fantasia M.D.   On: 04/08/2018 13:23   Ct Angio Neck W Or Wo Contrast  Result Date: 04/08/2018 CLINICAL DATA:  Stroke follow-up EXAM: CT ANGIOGRAPHY HEAD AND NECK TECHNIQUE: Multidetector CT imaging of the head and neck was performed using the standard protocol during bolus administration of intravenous contrast. Multiplanar CT image reconstructions and MIPs were obtained to evaluate the vascular anatomy. Carotid stenosis measurements (when applicable) are obtained utilizing NASCET criteria, using the distal internal carotid diameter as the denominator. CONTRAST:  51mL ISOVUE-370 IOPAMIDOL (ISOVUE-370) INJECTION 76% COMPARISON:  Brain MRI from yesterday. FINDINGS: CT HEAD FINDINGS Brain: Acute infarcts are not clearly visible. No evidence of progressive infarct. No hemorrhage or hydrocephalus. Vascular: See below Skull: Negative Sinuses:  Negative Orbits: Negative Review of the MIP images confirms the above findings CTA NECK FINDINGS Aortic arch: Mild atherosclerotic plaque.  Three vessel branching. Right carotid system: No notable atheromatous changes. No beading or dissection. Left carotid system: The vessels are smooth and widely patent. No notable atheromatous changes. Vertebral arteries: No proximal subclavian stenosis or significant atherosclerosis. There is mild to moderate narrowing at the origin of the right vertebral artery. Otherwise the vertebral arteries are smooth and widely patent. Skeleton: No acute or aggressive finding. Chronic superior endplate fractures of T1, T2, and T3. Osteopenia and cervical disc degeneration. Other neck: No significant finding Upper chest: Known intrathoracic malignancy with subcarinal adenopathy and a right pleural effusion that has developed since prior. Emphysema. Review of the MIP images confirms the above findings CTA HEAD FINDINGS Anterior circulation: Moderate right M2 branch narrowing, presumably atheromatous. No branch occlusion or aneurysm. Posterior circulation: Codominant vertebral arteries. Hypoplastic right P1 segment. The vertebral and basilar arteries are smooth and diffusely patent. No branch occlusion or flow limiting stenosis. Venous sinuses: Patent Anatomic variants: As above Delayed phase: No  abnormal intracranial enhancement Review of the MIP images confirms the above findings IMPRESSION: 1. No emergent finding. 2. Mild for age atherosclerosis in the neck without flow limiting stenosis or evidence embolic source. 3. Known lung cancer. There is a right pleural effusion that was not seen on 03/23/2018 chest CT. Electronically Signed   By: Monte Fantasia M.D.   On: 04/08/2018 13:23   Mr Jeri Cos PX Contrast  Result Date: 04/07/2018 CLINICAL DATA:  Lung cancer.  No symptoms.  Staging. EXAM: MRI HEAD WITHOUT AND WITH CONTRAST TECHNIQUE: Multiplanar, multiecho pulse sequences of the brain  and surrounding structures were obtained without and with intravenous contrast. CONTRAST:  Gadavist 7 mL. COMPARISON:  PET scan 03/23/2018. FINDINGS: Brain: Two small foci of restricted diffusion, LEFT cerebellar cortex and white matter, corresponding low ADC, LEFT AICA territory, consistent with acute infarction. No underlying mass or vasogenic edema. No other similar findings. No hemorrhage, mass lesion, hydrocephalus, or extra-axial fluid. Premature for age atrophy. Chronic microvascular ischemic change of a mild-to-moderate nature affecting the white matter, primarily supratentorial. Post infusion imaging in all three planes demonstrates no abnormal enhancement of the brain or meninges. No intracranial metastatic disease is observed. Vascular: Flow voids are maintained. Specifically, the basilar artery, and both vertebral arteries, are patent. Cerebellar branches are not assessed as part of this examination, although a vessel in the expected region of the LEFT AICA demonstrates a flow void. Skull and upper cervical spine: Unremarkable. Sinuses/Orbits: Negative. Other: None. IMPRESSION: Subcentimeter acute strokes LEFT cerebellum. The adjacent vertebral artery and basilar artery appear patent, so other causes of infarction such as atrial fibrillation or hypercoagulable state could be considered as an etiology. No supratentorial abnormalities are evident. Atrophy and small vessel disease. No intracranial metastatic disease is observed. These results were called by telephone at the time of interpretation on 04/07/2018 at 11:12 am to Dr. Astrid Divine RAO , who verbally acknowledged these results. Electronically Signed   By: Staci Righter M.D.   On: 04/07/2018 11:14   US Carotid Bilateral  Result Date: 04/08/2018 CLINICAL DATA:  CVA.  History of hypertension and smoking. EXAM: BILATERAL CAROTID DUPLEX ULTRASOUND TECHNIQUE: Pearline Cables scale imaging, color Doppler and duplex ultrasound were performed of bilateral carotid and  vertebral arteries in the neck. COMPARISON:  None. FINDINGS: Criteria: Quantification of carotid stenosis is based on velocity parameters that correlate the residual internal carotid diameter with NASCET-based stenosis levels, using the diameter of the distal internal carotid lumen as the denominator for stenosis measurement. The following velocity measurements were obtained: RIGHT ICA:  109/35 cm/sec CCA:  10/62 cm/sec SYSTOLIC ICA/CCA RATIO:  1.1 ECA:  67 cm/sec LEFT ICA:  94/23 cm/sec CCA:  694/85 cm/sec SYSTOLIC ICA/CCA RATIO:  0.8 ECA:  109 cm/sec RIGHT CAROTID ARTERY: There is no grayscale evidence of significant intimal thickening or atherosclerotic plaque affecting the interrogated portions of the right carotid system. There are no elevated peak systolic velocities within the interrogated course of the right internal carotid artery to suggest a hemodynamically significant stenosis. RIGHT VERTEBRAL ARTERY:  Antegrade flow LEFT CAROTID ARTERY: There is no grayscale evidence of significant intimal thickening or atherosclerotic plaque affecting the interrogated portions of the left carotid system. There are no elevated peak systolic velocities within the interrogated course of the left internal carotid artery to suggest a hemodynamically significant stenosis. LEFT VERTEBRAL ARTERY:  Antegrade flow IMPRESSION: Normal carotid Doppler ultrasound Electronically Signed   By: Sandi Mariscal M.D.   On: 04/08/2018 08:17     Management  plans discussed with the patient, family and they are in agreement.  CODE STATUS:     Code Status Orders  (From admission, onward)         Start     Ordered   04/07/18 1647  Do not attempt resuscitation (DNR)  Continuous    Question Answer Comment  In the event of cardiac or respiratory ARREST Do not call a "code blue"   In the event of cardiac or respiratory ARREST Do not perform Intubation, CPR, defibrillation or ACLS   In the event of cardiac or respiratory ARREST Use  medication by any route, position, wound care, and other measures to relive pain and suffering. May use oxygen, suction and manual treatment of airway obstruction as needed for comfort.   Comments nurse may pronounce      04/07/18 1647        Code Status History    This patient has a current code status but no historical code status.    Advance Directive Documentation     Most Recent Value  Type of Advance Directive  Healthcare Power of Attorney, Living will  Pre-existing out of facility DNR order (yellow form or pink MOST form)  -  "MOST" Form in Place?  -      TOTAL TIME TAKING CARE OF THIS PATIENT: 40 minutes.    Fritzi Mandes M.D on 04/08/2018 at 3:13 PM  Between 7am to 6pm - Pager - [phone] After 6pm go to [email or web address] - password [email or web address] [facility]  Office  [phone]  CC: Primary care physician; [facility]

## 2018-04-08 NOTE — Evaluation (Signed)
Occupational Therapy Evaluation Patient Details Name: Philip Weiss MRN: [id number] DOB: [birth year] Today's Date: 04/08/2018    History of Present Illness Pt. is a 72 y.o. male who was admitted to [facility] with subcentimeter acute strokes in the left cerebellum. Pt. PMHx includes: Lung CA, HTN,    Clinical Impression   Pt. Is independent with ADLs. There are no UE, visual, or cognitive impairments limiting ADL functioning at this time. As a result of the initial assessment no further OT services are warranted at this time. No follow-up services are indicated. Pt. is in agreement. Will complete the OT orders.    Follow Up Recommendations  No OT follow up    Equipment Recommendations       Recommendations for Other Services       Precautions / Restrictions Precautions Precautions: None Restrictions Weight Bearing Restrictions: No      Mobility Bed Mobility    Independent                Transfers    Independent                  Balance  Standing unsupported: Good with no LOB                                         ADL either performed or assessed with clinical judgement   ADL   Independent                                             Vision Baseline Vision/History: Wears glasses Wears Glasses: Reading only       Perception     Praxis      Pertinent Vitals/Pain Pain Assessment: No/denies pain     Hand Dominance Right   Extremity/Trunk Assessment Upper Extremity Assessment Upper Extremity Assessment: Overall WFL for tasks assessed           Communication Communication Communication: No difficulties   Cognition Arousal/Alertness: Awake/alert Behavior During Therapy: WFL for tasks assessed/performed Overall Cognitive Status: Within Functional Limits for tasks assessed                                     General Comments       Exercises     Shoulder Instructions      Home Living  Family/patient expects to be discharged to:: Private residence Living Arrangements: Spouse/significant other Available Help at Discharge: Family Type of Home: House       Home Layout: One level     Bathroom Shower/Tub: Tub only         Home Equipment: Cane - single point          Prior Functioning/Environment Level of Independence: Independent        Comments: Pt. was independent with ADLs, and IADLs. Pt. does not drive, and his wife assist with medication management.        OT Problem List:        OT Treatment/Interventions:      OT Goals(Current goals can be found in the care plan section) Acute Rehab OT Goals Patient Stated Goal: To return home OT Goal Formulation: With patient Potential to Achieve Goals: Good  OT Frequency:     Barriers to D/C:            Co-evaluation              AM-PAC PT "6 Clicks" Daily Activity     Outcome Measure Help from another person eating meals?: None Help from another person taking care of personal grooming?: None Help from another person toileting, which includes using toliet, bedpan, or urinal?: None Help from another person bathing (including washing, rinsing, drying)?: None Help from another person to put on and taking off regular upper body clothing?: None Help from another person to put on and taking off regular lower body clothing?: None 6 Click Score: 24   End of Session Equipment Utilized During Treatment: Gait belt  Activity Tolerance: Patient tolerated treatment well Patient left: in bed                   Time: 1050-1105 OT Time Calculation (min): 15 min Charges:  OT General Charges $OT Visit: 1 Visit OT Evaluation $OT Eval Low Complexity: 1 Low  Harrel Carina, MS, OTR/L   Harrel Carina 04/08/2018, 12:28 PM

## 2018-04-08 NOTE — Progress Notes (Signed)
*  PRELIMINARY RESULTS* Echocardiogram 2D Echocardiogram has been performed.  Philip Weiss 04/08/2018, 9:32 AM

## 2018-04-08 NOTE — Consult Note (Signed)
Referring Physician:     Chief Complaint: Abnormal MRI of the brain  HPI: Philip Weiss is an 72 y.o. male with pertinent history of smoking for the past 67 years with recent diagnosis of malignant neoplasm of middle lobe of right lung presenting to the ED for evaluation of abnormal MRI of the brain.  Per patient's family and the ED reports, patient was recently evaluated by his oncologist Dr. Janese Banks who ordered an MRI of the brain for staging due to recent diagnosis of lung cancer.  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  Patient states that he ha had no symptoms of stroke or strokelike symptoms.  He denied symptoms of dizziness, gait disturbances, speech abnormality, cranial nerve deficit, seizures, focal motor or sensory deficits, diplopia, or vomiting, ipsilateral or contralateral paralysis/weakness, numbness or tingling, or other focal neurologic deficit.  Patient however states that he has had chronic headaches the past 3 weeks predominantly located in his bifrontal region without other associated symptoms.  He usually takes a Tylenol or ibuprofen for his headache, currently rates headache at 8 out of 10.  He is currently admitted for further stroke work-up and management.  Initial NIH stroke scale of 0  Date last known well: Unable to determine Time last known well: Unable to determine tPA Given: No: NIH stroke scale of 0, unable to determine on date or time last known well.  Past Medical History:  Diagnosis Date  . Hypertension   . Lung cancer [facility])     Past Surgical History:  Procedure Laterality Date  . CYST EXCISION Left    buttock  . ENDOBRONCHIAL ULTRASOUND Right 03/29/2018   Procedure: ENDOBRONCHIAL ULTRASOUND;  Surgeon: Tyler Pita, MD;  Location: [facility] ORS;  Service: Cardiopulmonary;  Laterality: Right;  . FLEXIBLE BRONCHOSCOPY Right 03/29/2018    Procedure: FLEXIBLE BRONCHOSCOPY;  Surgeon: Tyler Pita, MD;  Location: [facility] ORS;  Service: Cardiopulmonary;  Laterality: Right;    Family History  Problem Relation Age of Onset  . Diabetes Mother   . CAD Mother   . Stroke Father    Social History:  reports that he quit smoking 9 days ago. His smoking use included cigarettes. He has a 58.00 pack-year smoking history. He has never used smokeless tobacco. He reports that he drank alcohol. He reports that he has current or past drug history.  Allergies:  Allergies  Allergen Reactions  . Aspirin Itching    Medications:  I have reviewed the patient's current medications. Prior to Admission:  Medications Prior to Admission  Medication Sig Dispense Refill Last Dose  . budesonide-formoterol (SYMBICORT) 160-4.5 MCG/ACT inhaler Inhale 2 puffs into the lungs 2 (two) times Philip.   04/07/2018 at 0800  . Cyanocobalamin (VITAMIN B-12 PO) Take 1 tablet by mouth Philip.   04/07/2018 at 0800  . losartan (COZAAR) 25 MG tablet Take 25 mg by mouth Philip.    04/07/2018 at 0800  . Multiple Vitamins-Minerals (MULTIVITAMIN PO) Take 1 tablet by mouth Philip.   04/07/2018 at 0800  . Pyridoxine HCl (VITAMIN B-6 PO) Take 1 tablet by mouth Philip.   04/07/2018 at 0800  . dexamethasone (DECADRON) 4 MG tablet Take 2 tablets (8 mg total) by mouth Philip. Start the day after chemotherapy for 2 days. 30 tablet 1 Taking  . lidocaine-prilocaine (EMLA) cream Apply to affected area once 30 g 3 Taking  .  LORazepam (ATIVAN) 0.5 MG tablet Take 1 tablet (0.5 mg total) by mouth every 6 (six) hours as needed (Nausea or vomiting). 30 tablet 0 Taking  . ondansetron (ZOFRAN) 8 MG tablet Take 1 tablet (8 mg total) by mouth 2 (two) times Philip as needed for refractory nausea / vomiting. Start on day 3 after chemo. 30 tablet 1 Taking  . prochlorperazine (COMPAZINE) 10 MG tablet Take 1 tablet (10 mg total) by mouth every 6 (six) hours as needed (Nausea or vomiting). 30 tablet 1  Taking  . Spacer/Aero-Holding Chambers DEVI 1 each by Does not apply route Philip. 1 each 0    Scheduled: . aspirin EC  81 mg Oral Philip  . atorvastatin  40 mg Oral q1800  . enoxaparin (LOVENOX) injection  40 mg Subcutaneous Q24H  . ipratropium-albuterol  3 mL Nebulization Q6H  . mometasone-formoterol  2 puff Inhalation BID  . multivitamin with minerals   Oral Philip  . vitamin B-6  50 mg Oral Philip  . vitamin B-12  100 mcg Oral Philip    ROS: History obtained from the patient   General ROS: negative for - chills, fatigue, fever, night sweats, weight gain or weight loss Psychological ROS: negative for - behavioral disorder, hallucinations, memory difficulties, mood swings or suicidal ideation Ophthalmic ROS: negative for - blurry vision, double vision, eye pain or loss of vision ENT ROS: negative for - epistaxis, nasal discharge, oral lesions, sore throat, tinnitus or vertigo Allergy and Immunology ROS: negative for - hives or itchy/watery eyes Hematological and Lymphatic ROS: negative for - bleeding problems, bruising or swollen lymph nodes Endocrine ROS: negative for - galactorrhea, hair pattern changes, polydipsia/polyuria or temperature intolerance Respiratory ROS: negative for - cough, hemoptysis, shortness of breath or wheezing Cardiovascular ROS: negative for - chest pain, dyspnea on exertion, edema or irregular heartbeat Gastrointestinal ROS: negative for - abdominal pain, diarrhea, hematemesis, nausea/vomiting or stool incontinence Genito-Urinary ROS: negative for - dysuria, hematuria, incontinence or urinary frequency/urgency Musculoskeletal ROS: negative for - joint swelling or muscular weakness Neurological ROS: as noted in HPI Dermatological ROS: negative for rash and skin lesion changes  Physical Examination: Blood pressure 138/83, pulse 73, temperature 98.7 F (37.1 C), temperature source Oral, resp. rate 18, height 6' (1.829 m), weight 71.3 kg, SpO2 100 %.   HEENT-   Normocephalic, no lesions, without obvious abnormality.  Normal external eye and conjunctiva.  Normal TM's bilaterally.  Normal auditory canals and external ears. Normal external nose, mucus membranes and septum.  Normal pharynx. Cardiovascular- S1, S2 normal, pulses palpable throughout   Lungs- chest clear, no wheezing, rales, normal symmetric air entry Abdomen- soft, non-tender; bowel sounds normal; no masses,  no organomegaly Extremities- no edema Lymph-no adenopathy palpable Musculoskeletal-no joint tenderness, deformity or swelling Skin-warm and dry, no hyperpigmentation, vitiligo, or suspicious lesions  Neurological Exam   Mental Status: Alert, oriented, thought content appropriate.  Speech fluent without evidence of aphasia.  Able to follow 3 step commands without difficulty. Attention span and concentration seemed appropriate  Cranial Nerves: II: Discs flat bilaterally; Visual fields grossly normal, pupils equal, round, reactive to light and accommodation III,IV, VI: ptosis not present, extra-ocular motions intact bilaterally V,VII: smile symmetric, facial light touch sensation intact VIII: hearing normal bilaterally IX,X: gag reflex present XI: bilateral shoulder shrug XII: midline tongue extension Motor: Right :  Upper extremity   5/5 Without pronator drift      Left: Upper extremity   5/5 without pronator drift Right:   Lower extremity  5/5                                          Left: Lower extremity   5/5 Tone and bulk:normal tone throughout; no atrophy noted Sensory: Pinprick and light touch intact bilaterally Deep Tendon Reflexes: 2+ and symmetric throughout Plantars: Right: mute                              Left: mute Cerebellar: Finger-to-nose testing intact bilaterally. Heel to shin testing normal bilaterally Gait: not tested due to safety concerns  Data Reviewed  Laboratory Studies:  Basic Metabolic Panel: Recent Labs  Lab 04/07/18 1559 04/08/18 0451  NA  132* 134*  K 3.7 3.9  CL 102 104  CO2 21* 23  GLUCOSE 156* 115*  BUN 9 7*  CREATININE 0.81 0.82  CALCIUM 8.4* 8.2*    Liver Function Tests: Recent Labs  Lab 04/07/18 1559  AST 20  ALT 32  ALKPHOS 67  BILITOT 0.7  PROT 7.1  ALBUMIN 2.7*   No results for input(s): LIPASE, AMYLASE in the last 168 hours. No results for input(s): AMMONIA in the last 168 hours.  CBC: Recent Labs  Lab 04/07/18 1559 04/08/18 0451  WBC 12.2* 10.3  NEUTROABS 8.4*  --   HGB 10.3* 9.9*  HCT 30.1* 30.0*  MCV 95.9 97.4  PLT 303 289    Cardiac Enzymes: No results for input(s): CKTOTAL, CKMB, CKMBINDEX, TROPONINI in the last 168 hours.  BNP: Invalid input(s): POCBNP  CBG: No results for input(s): GLUCAP in the last 168 hours.  Microbiology: No results found for this or any previous visit.  Coagulation Studies: Recent Labs    04/07/18 1559  LABPROT 15.2  INR 1.21    Urinalysis: No results for input(s): COLORURINE, LABSPEC, PHURINE, GLUCOSEU, HGBUR, BILIRUBINUR, KETONESUR, PROTEINUR, UROBILINOGEN, NITRITE, LEUKOCYTESUR in the last 168 hours.  Invalid input(s): APPERANCEUR  Lipid Panel:    Component Value Date/Time   CHOL 147 04/08/2018 0451   TRIG 100 04/08/2018 0451   HDL 26 (L) 04/08/2018 0451   CHOLHDL 5.7 04/08/2018 0451   VLDL 20 04/08/2018 0451   LDLCALC 101 (H) 04/08/2018 0451    HgbA1C:  Lab Results  Component Value Date   HGBA1C 5.7 (H) 04/07/2018    Urine Drug Screen:  No results found for: LABOPIA, COCAINSCRNUR, LABBENZ, AMPHETMU, THCU, LABBARB  Alcohol Level: No results for input(s): ETH in the last 168 hours.  Other results: EKG: normal EKG, normal sinus rhythm, unchanged from previous tracings, PAC's noted. Vent. rate 80 BPM PR interval 158 ms QRS duration 82 ms QT/QTc 372/429 ms P-R-T axes 40 8 -10  Imaging: Mr Jeri Cos Wo Contrast  Result Date: 04/07/2018 CLINICAL DATA:  Lung cancer.  No symptoms.  Staging. EXAM: MRI HEAD WITHOUT AND WITH  CONTRAST TECHNIQUE: Multiplanar, multiecho pulse sequences of the brain and surrounding structures were obtained without and with intravenous contrast. CONTRAST:  Gadavist 7 mL. COMPARISON:  PET scan 03/23/2018. FINDINGS: Brain: Two small foci of restricted diffusion, LEFT cerebellar cortex and white matter, corresponding low ADC, LEFT AICA territory, consistent with acute infarction. No underlying mass or vasogenic edema. No other similar findings. No hemorrhage, mass lesion, hydrocephalus, or extra-axial fluid. Premature for age atrophy. Chronic microvascular ischemic change of a mild-to-moderate nature affecting the white matter, primarily supratentorial. Post infusion  imaging in all three planes demonstrates no abnormal enhancement of the brain or meninges. No intracranial metastatic disease is observed. Vascular: Flow voids are maintained. Specifically, the basilar artery, and both vertebral arteries, are patent. Cerebellar branches are not assessed as part of this examination, although a vessel in the expected region of the LEFT AICA demonstrates a flow void. Skull and upper cervical spine: Unremarkable. Sinuses/Orbits: Negative. Other: None. IMPRESSION: Subcentimeter acute strokes LEFT cerebellum. The adjacent vertebral artery and basilar artery appear patent, so other causes of infarction such as atrial fibrillation or hypercoagulable state could be considered as an etiology. No supratentorial abnormalities are evident. Atrophy and small vessel disease. No intracranial metastatic disease is observed. These results were called by telephone at the time of interpretation on 04/07/2018 at 11:12 am to Dr. Astrid Divine RAO , who verbally acknowledged these results. Electronically Signed   By: Staci Righter M.D.   On: 04/07/2018 11:14   US Carotid Bilateral  Result Date: 04/08/2018 CLINICAL DATA:  CVA.  History of hypertension and smoking. EXAM: BILATERAL CAROTID DUPLEX ULTRASOUND TECHNIQUE: Pearline Cables scale imaging,  color Doppler and duplex ultrasound were performed of bilateral carotid and vertebral arteries in the neck. COMPARISON:  None. FINDINGS: Criteria: Quantification of carotid stenosis is based on velocity parameters that correlate the residual internal carotid diameter with NASCET-based stenosis levels, using the diameter of the distal internal carotid lumen as the denominator for stenosis measurement. The following velocity measurements were obtained: RIGHT ICA:  109/35 cm/sec CCA:  19/50 cm/sec SYSTOLIC ICA/CCA RATIO:  1.1 ECA:  67 cm/sec LEFT ICA:  94/23 cm/sec CCA:  932/67 cm/sec SYSTOLIC ICA/CCA RATIO:  0.8 ECA:  109 cm/sec RIGHT CAROTID ARTERY: There is no grayscale evidence of significant intimal thickening or atherosclerotic plaque affecting the interrogated portions of the right carotid system. There are no elevated peak systolic velocities within the interrogated course of the right internal carotid artery to suggest a hemodynamically significant stenosis. RIGHT VERTEBRAL ARTERY:  Antegrade flow LEFT CAROTID ARTERY: There is no grayscale evidence of significant intimal thickening or atherosclerotic plaque affecting the interrogated portions of the left carotid system. There are no elevated peak systolic velocities within the interrogated course of the left internal carotid artery to suggest a hemodynamically significant stenosis. LEFT VERTEBRAL ARTERY:  Antegrade flow IMPRESSION: Normal carotid Doppler ultrasound Electronically Signed   By: Sandi Mariscal M.D.   On: 04/08/2018 08:17   Patient seen and examined.  Clinical course and management discussed.  Necessary edits performed.  I agree with the above.  Assessment and plan of care developed and discussed below.    Assessment: 72 y.o. male with a pertinent history of smoking for the past 58 years with recent diagnosis of malignant neoplasm of middle lobe of right lung presenting to the ED for evaluation of abnormal MRI of the brain.  MRI of the brain  reviewed and shows subcentimeter acute infarcts in the left cerebellum.  Concern is for embolic etiology. US carotids bilaterally did not show hemodynamically significant stenosis.  Hemoglobin A1c 5.7, LDL 101.  Patient states he was not on antiplatelet or anticoagulation prior to this event.  Stroke Risk Factors - family history, hypertension and smoking  Plan: 1. Prophylactic therapy-Antiplatelet med: Aspirin - dose 81 mg/day 2. Start statin with goal  low density lipoprotein (LDL) <70 mg/dl 3. Echocardiogram with bubble study pending 4. NPO until RN stroke swallow screen 5. Telemetry monitoring 6. Frequent neuro checks 7. CTA of head and neck for better view of the  posterior circulation 8. PT/OT/Speech evaluation  This patient was staffed with Dr. Magda Paganini, Doy Mince who personally evaluated patient, reviewed documentation and agreed with assessment and plan of care as above.  Rufina Falco, DNP, FNP-BC Board certified Nurse Practitioner Neurology Department  04/08/2018, 11:45 AM  Alexis Goodell, MD Neurology [phone]  04/08/2018  12:32 PM

## 2018-04-08 NOTE — Progress Notes (Signed)
Patient discharged home per MD order. All discharge instructions given and all questions answered. 

## 2018-04-08 NOTE — Progress Notes (Signed)
PT Cancellation Note  Patient Details Name: Philip Weiss MRN: [id number] DOB: 1946-04-04   Cancelled Treatment:    Reason Eval/Treat Not Completed: PT screened, no needs identified, will sign off Pt in bed A&Ox4 on arrival. Pt screened for sensation, strength, coordination, proprioception, mobility, and balance. No deficits noted. Pt reported very mild HA, BP measured 139/70 in sitting. Pt educated on the signs and symptoms of stroke. This date pt demonstrates all bed mobility/transfers/ambulation at baseline level. Pt does not require any further PT needs at this time. Pt will be dc in house and does not require follow up. RN aware. Will dc current orders.   Algis Downs, SPT  04/08/2018, 10:39 AM

## 2018-04-09 LAB — BETA-2-GLYCOPROTEIN I ABS, IGG/M/A: Beta-2 Glyco I IgG: <9 GPI IgG units (ref 0–20)

## 2018-04-09 LAB — LUPUS ANTICOAGULANT PANEL
DRVVT: 44.4 s (ref 0.0–47.0)
PTT Lupus Anticoagulant: 44.9 s (ref 0.0–51.9)

## 2018-04-09 LAB — PROTEIN C ACTIVITY: Protein C Activity: 71 % — ABNORMAL LOW (ref 73–180)

## 2018-04-09 LAB — PROTEIN C, TOTAL: Protein C, Total: 53 % — ABNORMAL LOW (ref 60–150)

## 2018-04-09 LAB — PROTEIN S ACTIVITY: PROTEIN S ACTIVITY: 84 % (ref 63–140)

## 2018-04-09 LAB — PROTEIN S, TOTAL: Protein S Ag, Total: 92 % (ref 60–150)

## 2018-04-11 ENCOUNTER — Telehealth: Payer: Self-pay

## 2018-04-11 NOTE — Telephone Encounter (Signed)
EMMI Follow-up: Noted on the report that the patient had some other questions.  I talked with Philip Weiss and he said he was aware of an appointment on Nov. 6 and he had looked over his discharge paperwork.  I asked if he had made other follow-up appointments yet and he said his wife handles that and that she was out right now.  I gave him my contact information if she wished to call me back.  I let him know there would be another automated call with a different series of questions and to let us know if he had any questions at that time.

## 2018-04-12 ENCOUNTER — Other Ambulatory Visit (INDEPENDENT_AMBULATORY_CARE_PROVIDER_SITE_OTHER): Payer: Self-pay | Admitting: Nurse Practitioner

## 2018-04-12 LAB — PROTHROMBIN GENE MUTATION

## 2018-04-12 LAB — FACTOR 5 LEIDEN

## 2018-04-13 ENCOUNTER — Other Ambulatory Visit: Payer: Self-pay

## 2018-04-13 ENCOUNTER — Ambulatory Visit
Admission: RE | Admit: 2018-04-13 | Discharge: 2018-04-13 | Disposition: A | Discharge status: Home / Self Care | Payer: Medicare PPO | Source: Ambulatory Visit | Attending: Vascular Surgery | Admitting: Vascular Surgery

## 2018-04-13 ENCOUNTER — Encounter
Admission: RE | Disposition: A | Discharge status: Home / Self Care | Payer: Self-pay | Source: Ambulatory Visit | Attending: Vascular Surgery

## 2018-04-13 DIAGNOSIS — Z87891 Personal history of nicotine dependence: Secondary | ICD-10-CM | POA: Insufficient documentation

## 2018-04-13 DIAGNOSIS — Z9889 Other specified postprocedural states: Secondary | ICD-10-CM | POA: Diagnosis not present

## 2018-04-13 DIAGNOSIS — C3490 Malignant neoplasm of unspecified part of unspecified bronchus or lung: Secondary | ICD-10-CM | POA: Diagnosis not present

## 2018-04-13 DIAGNOSIS — C3491 Malignant neoplasm of unspecified part of right bronchus or lung: Secondary | ICD-10-CM | POA: Diagnosis present

## 2018-04-13 DIAGNOSIS — Z7951 Long term (current) use of inhaled steroids: Secondary | ICD-10-CM | POA: Diagnosis not present

## 2018-04-13 DIAGNOSIS — C3492 Malignant neoplasm of unspecified part of left bronchus or lung: Secondary | ICD-10-CM

## 2018-04-13 HISTORY — PX: PORTA CATH INSERTION: CATH118285

## 2018-04-13 SURGERY — PORTA CATH INSERTION
Anesthesia: Moderate Sedation

## 2018-04-13 MED ORDER — MIDAZOLAM HCL 5 MG/5ML IJ SOLN
INTRAMUSCULAR | Status: AC
  Filled 2018-04-13: qty 5

## 2018-04-13 MED ORDER — HEPARIN (PORCINE) IN NACL 1000-0.9 UT/500ML-% IV SOLN
INTRAVENOUS | Status: AC
  Filled 2018-04-13: qty 500

## 2018-04-13 MED ORDER — CEFAZOLIN SODIUM-DEXTROSE 1-4 GM/50ML-% IV SOLN
1.0000 g | Freq: Once | INTRAVENOUS | Status: AC
  Administered 2018-04-13: 1 g via INTRAVENOUS

## 2018-04-13 MED ORDER — ONDANSETRON HCL 4 MG/2ML IJ SOLN: 4.0000 mg | Freq: Four times a day (QID) | INTRAMUSCULAR | Status: DC | PRN

## 2018-04-13 MED ORDER — FENTANYL CITRATE (PF) 100 MCG/2ML IJ SOLN
INTRAMUSCULAR | Status: AC
  Filled 2018-04-13: qty 2

## 2018-04-13 MED ORDER — HYDROMORPHONE HCL 1 MG/ML IJ SOLN: 1.0000 mg | Freq: Once | INTRAMUSCULAR | Status: DC | PRN

## 2018-04-13 MED ORDER — SODIUM CHLORIDE 0.9 % IV SOLN
Freq: Once | INTRAVENOUS | Status: AC
  Administered 2018-04-13: 10:00:00
  Filled 2018-04-13: qty 80

## 2018-04-13 MED ORDER — LIDOCAINE-EPINEPHRINE (PF) 1 %-1:200000 IJ SOLN
INTRAMUSCULAR | Status: AC
  Filled 2018-04-13: qty 30

## 2018-04-13 MED ORDER — SODIUM CHLORIDE 0.9 % IV SOLN: INTRAVENOUS | Status: DC

## 2018-04-13 SURGICAL SUPPLY — 10 items
DERMABOND ADVANCED (GAUZE/BANDAGES/DRESSINGS) ×2
DERMABOND ADVANCED .7 DNX12 (GAUZE/BANDAGES/DRESSINGS) ×1 IMPLANT
KIT PORT POWER 8FR ISP CVUE (Port) ×3 IMPLANT
PACK ANGIOGRAPHY (CUSTOM PROCEDURE TRAY) ×3 IMPLANT
PAD GROUND ADULT SPLIT (MISCELLANEOUS) ×3 IMPLANT
PENCIL ELECTRO HAND CTR (MISCELLANEOUS) ×3 IMPLANT
SUT MNCRL AB 4-0 PS2 18 (SUTURE) ×3 IMPLANT
SUT PROLENE 0 CT 1 30 (SUTURE) ×3 IMPLANT
SUT VIC AB 3-0 SH 27 (SUTURE) ×2
SUT VIC AB 3-0 SH 27X BRD (SUTURE) ×1 IMPLANT

## 2018-04-13 NOTE — H&P (Signed)
[facility] History & Physical Update  The patient was interviewed and re-examined.  The patient's previous History and Physical has been reviewed and is unchanged.  There is no change in the plan of care. We plan to proceed with the scheduled procedure.  Leotis Pain, MD  04/13/2018, 9:27 AM

## 2018-04-13 NOTE — Op Note (Signed)
      [facility]       Operative Note  Date: 04/13/2018  Preoperative diagnosis:  1. Lung cancer  Postoperative diagnosis:  Same as above  Procedures: #1. Ultrasound guidance for vascular access to the right internal jugular vein. #2. Fluoroscopic guidance for placement of catheter. #3. Placement of CT compatible Port-A-Cath, right internal jugular vein.  Surgeon: Leotis Pain, MD.   Anesthesia: Local with moderate conscious sedation for approximately 20  minutes using 3 mg of Versed and 100 mcg of Fentanyl  Fluoroscopy time: less than 1 minute  Contrast used: 0  Estimated blood loss: 5 cc  Indication for the procedure:  The patient is a 72 y.o.male with lung cancer.  The patient needs a Port-A-Cath for durable venous access, chemotherapy, lab draws, and CT scans. We are asked to place this. Risks and benefits were discussed and informed consent was obtained.  Description of procedure: The patient was brought to the vascular and interventional radiology suite.  Moderate conscious sedation was administered throughout the procedure during a face to face encounter with the patient with my supervision of the RN administering medicines and monitoring the patient's vital signs, pulse oximetry, telemetry and mental status throughout from the start of the procedure until the patient was taken to the recovery room. The right neck chest and shoulder were sterilely prepped and draped, and a sterile surgical field was created. Ultrasound was used to help visualize a patent right internal jugular vein. This was then accessed under direct ultrasound guidance without difficulty with the Seldinger needle and a permanent image was recorded. A J-wire was placed. After skin nick and dilatation, the peel-away sheath was then placed over the wire. I then anesthetized an area under the clavicle approximately 1-2 fingerbreadths. A transverse incision was created and an inferior pocket was  created with electrocautery and blunt dissection. The port was then brought onto the field, placed into the pocket and secured to the chest wall with 2 Prolene sutures. The catheter was connected to the port and tunneled from the subclavicular incision to the access site. Fluoroscopic guidance was then used to cut the catheter to an appropriate length. The catheter was then placed through the peel-away sheath and the peel-away sheath was removed. The catheter tip was parked in excellent location under fluorocoscopic guidance in the cavoatrial junction. The pocket was then irrigated with antibiotic impregnated saline and the wound was closed with a running 3-0 Vicryl and a 4-0 Monocryl. The access incision was closed with a single 4-0 Monocryl. The Huber needle was used to withdraw blood and flush the port with heparinized saline. Dermabond was then placed as a dressing. The patient tolerated the procedure well and was taken to the recovery room in stable condition.   Leotis Pain 04/13/2018 10:58 AM   This note was created with Dragon Medical transcription system. Any errors in dictation are purely unintentional.

## 2018-04-14 ENCOUNTER — Encounter: Payer: Self-pay | Admitting: Vascular Surgery

## 2018-04-17 ENCOUNTER — Other Ambulatory Visit: Payer: Self-pay | Admitting: *Deleted

## 2018-04-17 DIAGNOSIS — C342 Malignant neoplasm of middle lobe, bronchus or lung: Secondary | ICD-10-CM

## 2018-04-18 ENCOUNTER — Ambulatory Visit
Admission: RE | Admit: 2018-04-18 | Discharge: 2018-04-18 | Disposition: A | Discharge status: Home / Self Care | Payer: Medicare PPO | Source: Ambulatory Visit | Attending: Radiation Oncology | Admitting: Radiation Oncology

## 2018-04-18 DIAGNOSIS — C342 Malignant neoplasm of middle lobe, bronchus or lung: Secondary | ICD-10-CM | POA: Diagnosis present

## 2018-04-19 ENCOUNTER — Ambulatory Visit
Admission: RE | Admit: 2018-04-19 | Discharge: 2018-04-19 | Disposition: A | Discharge status: Home / Self Care | Payer: Medicare PPO | Source: Ambulatory Visit | Attending: Radiation Oncology | Admitting: Radiation Oncology

## 2018-04-19 ENCOUNTER — Inpatient Hospital Stay: Payer: Medicare PPO | Attending: Oncology

## 2018-04-19 ENCOUNTER — Encounter: Payer: Self-pay | Admitting: Oncology

## 2018-04-19 ENCOUNTER — Inpatient Hospital Stay (HOSPITAL_BASED_OUTPATIENT_CLINIC_OR_DEPARTMENT_OTHER): Payer: Medicare PPO | Admitting: Oncology

## 2018-04-19 ENCOUNTER — Inpatient Hospital Stay: Payer: Medicare PPO

## 2018-04-19 VITALS — BP 137/75 | HR 87 | Temp 97.4°F | Resp 18 | Ht 69.0 in | Wt 156.0 lb

## 2018-04-19 VITALS — BP 158/82 | HR 78 | Temp 95.0°F | Resp 18

## 2018-04-19 DIAGNOSIS — Z7982 Long term (current) use of aspirin: Secondary | ICD-10-CM

## 2018-04-19 DIAGNOSIS — Z8673 Personal history of transient ischemic attack (TIA), and cerebral infarction without residual deficits: Secondary | ICD-10-CM | POA: Insufficient documentation

## 2018-04-19 DIAGNOSIS — R5383 Other fatigue: Secondary | ICD-10-CM | POA: Insufficient documentation

## 2018-04-19 DIAGNOSIS — Z5111 Encounter for antineoplastic chemotherapy: Secondary | ICD-10-CM | POA: Insufficient documentation

## 2018-04-19 DIAGNOSIS — R5381 Other malaise: Secondary | ICD-10-CM | POA: Diagnosis not present

## 2018-04-19 DIAGNOSIS — F17210 Nicotine dependence, cigarettes, uncomplicated: Secondary | ICD-10-CM

## 2018-04-19 DIAGNOSIS — C342 Malignant neoplasm of middle lobe, bronchus or lung: Secondary | ICD-10-CM

## 2018-04-19 DIAGNOSIS — I10 Essential (primary) hypertension: Secondary | ICD-10-CM

## 2018-04-19 DIAGNOSIS — Z79899 Other long term (current) drug therapy: Secondary | ICD-10-CM | POA: Diagnosis not present

## 2018-04-19 LAB — COMPREHENSIVE METABOLIC PANEL
ALBUMIN: 3 g/dL — AB (ref 3.5–5.0)
ALT: 17 U/L (ref 0–44)
ANION GAP: 6 (ref 5–15)
AST: 17 U/L (ref 15–41)
Alkaline Phosphatase: 77 U/L (ref 38–126)
BILIRUBIN TOTAL: 0.4 mg/dL (ref 0.3–1.2)
BUN: 9 mg/dL (ref 8–23)
CO2: 25 mmol/L (ref 22–32)
Calcium: 8.8 mg/dL — ABNORMAL LOW (ref 8.9–10.3)
Chloride: 103 mmol/L (ref 98–111)
Creatinine, Ser: 0.78 mg/dL (ref 0.61–1.24)
GFR calc Af Amer: >60 mL/min (ref >60)
GFR calc non Af Amer: >60 mL/min (ref >60)
GLUCOSE: 119 mg/dL — AB (ref 70–99)
POTASSIUM: 3.6 mmol/L (ref 3.5–5.1)
SODIUM: 134 mmol/L — AB (ref 135–145)
Total Protein: 7.6 g/dL (ref 6.5–8.1)

## 2018-04-19 LAB — CBC WITH DIFFERENTIAL/PLATELET
ABS IMMATURE GRANULOCYTES: 0.01 10*3/uL (ref 0.00–0.07)
BASOS PCT: 1 %
Basophils Absolute: 0 10*3/uL (ref 0.0–0.1)
Eosinophils Absolute: 0.3 10*3/uL (ref 0.0–0.5)
Eosinophils Relative: 5 %
HCT: 32.7 % — ABNORMAL LOW (ref 39.0–52.0)
Hemoglobin: 10.5 g/dL — ABNORMAL LOW (ref 13.0–17.0)
IMMATURE GRANULOCYTES: 0 %
LYMPHS ABS: 1.9 10*3/uL (ref 0.7–4.0)
LYMPHS PCT: 31 %
MCH: 31.5 pg (ref 26.0–34.0)
MCHC: 32.1 g/dL (ref 30.0–36.0)
MCV: 98.2 fL (ref 80.0–100.0)
MONO ABS: 0.7 10*3/uL (ref 0.1–1.0)
MONOS PCT: 11 %
NEUTROS ABS: 3.2 10*3/uL (ref 1.7–7.7)
NEUTROS PCT: 52 %
Platelets: 282 10*3/uL (ref 150–400)
RBC: 3.33 MIL/uL — ABNORMAL LOW (ref 4.22–5.81)
RDW: 13.2 % (ref 11.5–15.5)
WBC: 6 10*3/uL (ref 4.0–10.5)
nRBC: 0 % (ref 0.0–0.2)

## 2018-04-19 MED ORDER — HEPARIN SOD (PORK) LOCK FLUSH 100 UNIT/ML IV SOLN: 500.0000 [IU] | Freq: Once | INTRAVENOUS | Status: DC | PRN

## 2018-04-19 MED ORDER — DIPHENHYDRAMINE HCL 50 MG/ML IJ SOLN
50.0000 mg | Freq: Once | INTRAMUSCULAR | Status: AC
  Administered 2018-04-19: 50 mg via INTRAVENOUS
  Filled 2018-04-19: qty 1

## 2018-04-19 MED ORDER — HEPARIN SOD (PORK) LOCK FLUSH 100 UNIT/ML IV SOLN
500.0000 [IU] | Freq: Once | INTRAVENOUS | Status: AC
  Administered 2018-04-19: 500 [IU] via INTRAVENOUS
  Filled 2018-04-19: qty 5

## 2018-04-19 MED ORDER — SODIUM CHLORIDE 0.9% FLUSH
10.0000 mL | INTRAVENOUS | Status: DC | PRN
  Administered 2018-04-19: 10 mL via INTRAVENOUS
  Filled 2018-04-19: qty 10

## 2018-04-19 MED ORDER — PALONOSETRON HCL INJECTION 0.25 MG/5ML
0.2500 mg | Freq: Once | INTRAVENOUS | Status: AC
  Administered 2018-04-19: 0.25 mg via INTRAVENOUS
  Filled 2018-04-19: qty 5

## 2018-04-19 MED ORDER — SODIUM CHLORIDE 0.9 % IV SOLN
Freq: Once | INTRAVENOUS | Status: AC
  Administered 2018-04-19: 11:00:00 via INTRAVENOUS
  Filled 2018-04-19: qty 250

## 2018-04-19 MED ORDER — SODIUM CHLORIDE 0.9 % IV SOLN
20.0000 mg | Freq: Once | INTRAVENOUS | Status: AC
  Administered 2018-04-19: 20 mg via INTRAVENOUS
  Filled 2018-04-19: qty 2

## 2018-04-19 MED ORDER — SODIUM CHLORIDE 0.9 % IV SOLN
180.6000 mg | Freq: Once | INTRAVENOUS | Status: AC
  Administered 2018-04-19: 180 mg via INTRAVENOUS
  Filled 2018-04-19: qty 18

## 2018-04-19 MED ORDER — SODIUM CHLORIDE 0.9 % IV SOLN
45.0000 mg/m2 | Freq: Once | INTRAVENOUS | Status: AC
  Administered 2018-04-19: 84 mg via INTRAVENOUS
  Filled 2018-04-19: qty 14

## 2018-04-19 MED ORDER — FAMOTIDINE IN NACL 20-0.9 MG/50ML-% IV SOLN
20.0000 mg | Freq: Once | INTRAVENOUS | Status: AC
  Administered 2018-04-19: 20 mg via INTRAVENOUS
  Filled 2018-04-19: qty 50

## 2018-04-19 NOTE — Progress Notes (Signed)
Hematology/Oncology Consult note [facility]  Telephone:([phone] [phone]  Patient Care Team: Center, [facility] as PCP - General (Rule) Telford Nab, RN as Registered Nurse   Name of the patient: Philip Weiss  [id number]  1945-12-19   Date of visit: 04/19/18  Diagnosis- adenocarcinoma of the right lung stage III cT2 N3 M0  Chief complaint/ Reason for visit-on treatment assessment prior to cycle 1 of weekly carbotaxol chemotherapy  Heme/Onc history: patient is a 72 year old male with a long-standing history of smoking and is a current smoker with a 58-pack-year smoking history. He underwent lung cancer screening low-dose CT scan which showed a right middle lobe lung mass of about 4.8 cm. Subcarinal adenopathy 2.8 x 4 cm and probable right hilar adenopathy and soft tissue fullness 2.3 cm. This was followed by a PET CT scan which showed right middle lobe mass was hypermetabolic at 38.1 with hypermetabolic right hilar and infrahilar adenopathy. Hypermetabolic 2.5 cm subcarinal node and right paratracheal nodes. No hypermetabolic contralateral adenopathy. Left upper lobe 4 mm solitary pulmonary nodule below PET resolution. No evidence of hypermetabolic them elsewhere.  Biopsy of the right middle lobe showed non-small cell carcinoma favor adenocarcinoma.  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  However it showed acute cerebellar stroke for which patient was admitted to the hospital and underwent a work-up including carotid Dopplers as well as CTA of the head and neck and echo with bubble study which were unremarkable  Interval history-reports feeling well today.  Denies any significant cough or shortness of breath.  Denies any pain  ECOG PS- 1 Pain scale- 0 Opioid associated constipation-  no  Review of systems- Review of Systems  Constitutional: Positive for malaise/fatigue. Negative for chills, fever and weight loss.  HENT: Negative for congestion, ear discharge and nosebleeds.   Eyes: Negative for blurred vision.  Respiratory: Negative for cough, hemoptysis, sputum production, shortness of breath and wheezing.   Cardiovascular: Negative for chest pain, palpitations, orthopnea and claudication.  Gastrointestinal: Negative for abdominal pain, blood in stool, constipation, diarrhea, heartburn, melena, nausea and vomiting.  Genitourinary: Negative for dysuria, flank pain, frequency, hematuria and urgency.  Musculoskeletal: Negative for back pain, joint pain and myalgias.  Skin: Negative for rash.  Neurological: Negative for dizziness, tingling, focal weakness, seizures, weakness and headaches.  Endo/Heme/Allergies: Does not bruise/bleed easily.  Psychiatric/Behavioral: Negative for depression and suicidal ideas. The patient does not have insomnia.       Allergies  Allergen Reactions  . Aspirin Itching     Past Medical History:  Diagnosis Date  . Hypertension   . Lung cancer [facility])      Past Surgical History:  Procedure Laterality Date  . CYST EXCISION Left    buttock  . ENDOBRONCHIAL ULTRASOUND Right 03/29/2018   Procedure: ENDOBRONCHIAL ULTRASOUND;  Surgeon: Tyler Pita, MD;  Location: [facility] ORS;  Service: Cardiopulmonary;  Laterality: Right;  . FLEXIBLE BRONCHOSCOPY Right 03/29/2018   Procedure: FLEXIBLE BRONCHOSCOPY;  Surgeon: Tyler Pita, MD;  Location: [facility] ORS;  Service: Cardiopulmonary;  Laterality: Right;  . PORTA CATH INSERTION N/A 04/13/2018   Procedure: PORTA CATH INSERTION;  Surgeon: Algernon Huxley, MD;  Location: [facility];  Service: Cardiovascular;  Laterality: N/A;    Social History   Socioeconomic History  . Marital status: Married    Spouse name: Not on  file  . Number of children: 4  . Years of education: Not on file   . Highest education level: Not on file  Occupational History  . Occupation: retired    Comment: mill/construction   Social Needs  . Financial resource strain: Not hard at all  . Food insecurity:    Worry: Never true    Inability: Never true  . Transportation needs:    Medical: No    Non-medical: No  Tobacco Use  . Smoking status: Former Smoker    Packs/day: 1.00    Years: 58.00    Pack years: 58.00    Types: Cigarettes    Last attempt to quit: 03/30/2018    Years since quitting: 0.0  . Smokeless tobacco: Never Used  Substance and Sexual Activity  . Alcohol use: Not Currently    Comment: daily 32 oz beer  . Drug use: Not Currently  . Sexual activity: Not on file  Lifestyle  . Physical activity:    Days per week: 0 days    Minutes per session: 0 min  . Stress: Only a little  Relationships  . Social connections:    Talks on phone: More than three times a week    Gets together: Twice a week    Attends religious service: More than 4 times per year    Active member of club or organization: Not on file    Attends meetings of clubs or organizations: Not on file    Relationship status: Married  . Intimate partner violence:    Fear of current or ex partner: No    Emotionally abused: No    Physically abused: No    Forced sexual activity: No  Other Topics Concern  . Not on file  Social History Narrative  . Not on file    Family History  Problem Relation Age of Onset  . Diabetes Mother   . CAD Mother   . Stroke Father      Current Outpatient Medications:  .  aspirin EC 81 MG EC tablet, Take 1 tablet (81 mg total) by mouth daily., Disp: 30 tablet, Rfl: 1 .  atorvastatin (LIPITOR) 40 MG tablet, Take 1 tablet (40 mg total) by mouth daily at 6 PM., Disp: 30 tablet, Rfl: 1 .  budesonide-formoterol (SYMBICORT) 160-4.5 MCG/ACT inhaler, Inhale 2 puffs into the lungs 2 (two) times daily., Disp: , Rfl:  .  Cyanocobalamin (VITAMIN B-12 PO), Take 1 tablet by mouth daily., Disp:  , Rfl:  .  losartan (COZAAR) 25 MG tablet, Take 25 mg by mouth daily. , Disp: , Rfl:  .  Multiple Vitamins-Minerals (MULTIVITAMIN PO), Take 1 tablet by mouth daily., Disp: , Rfl:  .  Pyridoxine HCl (VITAMIN B-6 PO), Take 1 tablet by mouth daily., Disp: , Rfl:  .  Spacer/Aero-Holding Chambers DEVI, 1 each by Does not apply route daily., Disp: 1 each, Rfl: 0 .  dexamethasone (DECADRON) 4 MG tablet, Take 2 tablets (8 mg total) by mouth daily. Start the day after chemotherapy for 2 days. (Patient not taking: Reported on 04/19/2018), Disp: 30 tablet, Rfl: 1 .  LORazepam (ATIVAN) 0.5 MG tablet, Take 1 tablet (0.5 mg total) by mouth every 6 (six) hours as needed (Nausea or vomiting). (Patient not taking: Reported on 04/19/2018), Disp: 30 tablet, Rfl: 0 .  ondansetron (ZOFRAN) 8 MG tablet, Take 1 tablet (8 mg total) by mouth 2 (two) times daily as needed for refractory nausea / vomiting. Start on day 3 after chemo. (Patient  not taking: Reported on 04/19/2018), Disp: 30 tablet, Rfl: 1 .  prochlorperazine (COMPAZINE) 10 MG tablet, Take 1 tablet (10 mg total) by mouth every 6 (six) hours as needed (Nausea or vomiting). (Patient not taking: Reported on 04/19/2018), Disp: 30 tablet, Rfl: 1 No current facility-administered medications for this visit.   Facility-Administered Medications Ordered in Other Visits:  .  CARBOplatin (PARAPLATIN) 180 mg in sodium chloride 0.9 % 250 mL chemo infusion, 180 mg, Intravenous, Once, Sindy Guadeloupe, MD .  heparin lock flush 100 unit/mL, 500 Units, Intravenous, Once, Sindy Guadeloupe, MD .  heparin lock flush 100 unit/mL, 500 Units, Intracatheter, Once PRN, Sindy Guadeloupe, MD .  PACLitaxel (TAXOL) 84 mg in sodium chloride 0.9 % 250 mL chemo infusion (</= 49m/m2), 45 mg/m2 (Treatment Plan Recorded), Intravenous, Once, RSindy Guadeloupe MD, Last Rate: 66 mL/hr at 04/19/18 1215, 84 mg at 04/19/18 1215 .  sodium chloride flush (NS) 0.9 % injection 10 mL, 10 mL, Intravenous, PRN, RSindy Guadeloupe MD, 10 mL at 04/19/18 1040  Physical exam:  Vitals:   04/19/18 1030  BP: 137/75  Pulse: 87  Resp: 18  Temp: (!) 97.4 F (36.3 C)  TempSrc: Tympanic  SpO2: 98%  Weight: 156 lb (70.8 kg)  Height: _0  (1.753 m)   Physical Exam  Constitutional: He is oriented to person, place, and time.  Thin elderly gentleman in no acute distress  HENT:  Head: Normocephalic and atraumatic.  Eyes: Pupils are equal, round, and reactive to light. EOM are normal.  Neck: Normal range of motion.  Cardiovascular: Normal rate, regular rhythm and normal heart sounds.  Pulmonary/Chest: Effort normal and breath sounds normal.  Abdominal: Soft. Bowel sounds are normal.  Neurological: He is alert and oriented to person, place, and time.  Skin: Skin is warm and dry.     CMP Latest Ref Rng & Units 04/19/2018  Glucose 70 - 99 mg/dL 119(H)  BUN 8 - 23 mg/dL 9  Creatinine 0.61 - 1.24 mg/dL 0.78  Sodium 135 - 145 mmol/L 134(L)  Potassium 3.5 - 5.1 mmol/L 3.6  Chloride 98 - 111 mmol/L 103  CO2 22 - 32 mmol/L 25  Calcium 8.9 - 10.3 mg/dL 8.8(L)  Total Protein 6.5 - 8.1 g/dL 7.6  Total Bilirubin 0.3 - 1.2 mg/dL 0.4  Alkaline Phos 38 - 126 U/L 77  AST 15 - 41 U/L 17  ALT 0 - 44 U/L 17   CBC Latest Ref Rng & Units 04/19/2018  WBC 4.0 - 10.5 K/uL 6.0  Hemoglobin 13.0 - 17.0 g/dL 10.5(L)  Hematocrit 39.0 - 52.0 % 32.7(L)  Platelets 150 - 400 K/uL 282    No images are attached to the encounter.  Ct Angio Head W Or Wo Contrast  Result Date: 04/08/2018 CLINICAL DATA:  Stroke follow-up EXAM: CT ANGIOGRAPHY HEAD AND NECK TECHNIQUE: Multidetector CT imaging of the head and neck was performed using the standard protocol during bolus administration of intravenous contrast. Multiplanar CT image reconstructions and MIPs were obtained to evaluate the vascular anatomy. Carotid stenosis measurements (when applicable) are obtained utilizing NASCET criteria, using the distal internal carotid diameter as the  denominator. CONTRAST:  749mISOVUE-370 IOPAMIDOL (ISOVUE-370) INJECTION 76% COMPARISON:  Brain MRI from yesterday. FINDINGS: CT HEAD FINDINGS Brain: Acute infarcts are not clearly visible. No evidence of progressive infarct. No hemorrhage or hydrocephalus. Vascular: See below Skull: Negative Sinuses: Negative Orbits: Negative Review of the MIP images confirms the above findings CTA NECK FINDINGS Aortic  arch: Mild atherosclerotic plaque.  Three vessel branching. Right carotid system: No notable atheromatous changes. No beading or dissection. Left carotid system: The vessels are smooth and widely patent. No notable atheromatous changes. Vertebral arteries: No proximal subclavian stenosis or significant atherosclerosis. There is mild to moderate narrowing at the origin of the right vertebral artery. Otherwise the vertebral arteries are smooth and widely patent. Skeleton: No acute or aggressive finding. Chronic superior endplate fractures of T1, T2, and T3. Osteopenia and cervical disc degeneration. Other neck: No significant finding Upper chest: Known intrathoracic malignancy with subcarinal adenopathy and a right pleural effusion that has developed since prior. Emphysema. Review of the MIP images confirms the above findings CTA HEAD FINDINGS Anterior circulation: Moderate right M2 branch narrowing, presumably atheromatous. No branch occlusion or aneurysm. Posterior circulation: Codominant vertebral arteries. Hypoplastic right P1 segment. The vertebral and basilar arteries are smooth and diffusely patent. No branch occlusion or flow limiting stenosis. Venous sinuses: Patent Anatomic variants: As above Delayed phase: No abnormal intracranial enhancement Review of the MIP images confirms the above findings IMPRESSION: 1. No emergent finding. 2. Mild for age atherosclerosis in the neck without flow limiting stenosis or evidence embolic source. 3. Known lung cancer. There is a right pleural effusion that was not seen on  03/23/2018 chest CT. Electronically Signed   By: Monte Fantasia M.D.   On: 04/08/2018 13:23   Ct Angio Neck W Or Wo Contrast  Result Date: 04/08/2018 CLINICAL DATA:  Stroke follow-up EXAM: CT ANGIOGRAPHY HEAD AND NECK TECHNIQUE: Multidetector CT imaging of the head and neck was performed using the standard protocol during bolus administration of intravenous contrast. Multiplanar CT image reconstructions and MIPs were obtained to evaluate the vascular anatomy. Carotid stenosis measurements (when applicable) are obtained utilizing NASCET criteria, using the distal internal carotid diameter as the denominator. CONTRAST:  5m ISOVUE-370 IOPAMIDOL (ISOVUE-370) INJECTION 76% COMPARISON:  Brain MRI from yesterday. FINDINGS: CT HEAD FINDINGS Brain: Acute infarcts are not clearly visible. No evidence of progressive infarct. No hemorrhage or hydrocephalus. Vascular: See below Skull: Negative Sinuses: Negative Orbits: Negative Review of the MIP images confirms the above findings CTA NECK FINDINGS Aortic arch: Mild atherosclerotic plaque.  Three vessel branching. Right carotid system: No notable atheromatous changes. No beading or dissection. Left carotid system: The vessels are smooth and widely patent. No notable atheromatous changes. Vertebral arteries: No proximal subclavian stenosis or significant atherosclerosis. There is mild to moderate narrowing at the origin of the right vertebral artery. Otherwise the vertebral arteries are smooth and widely patent. Skeleton: No acute or aggressive finding. Chronic superior endplate fractures of T1, T2, and T3. Osteopenia and cervical disc degeneration. Other neck: No significant finding Upper chest: Known intrathoracic malignancy with subcarinal adenopathy and a right pleural effusion that has developed since prior. Emphysema. Review of the MIP images confirms the above findings CTA HEAD FINDINGS Anterior circulation: Moderate right M2 branch narrowing, presumably  atheromatous. No branch occlusion or aneurysm. Posterior circulation: Codominant vertebral arteries. Hypoplastic right P1 segment. The vertebral and basilar arteries are smooth and diffusely patent. No branch occlusion or flow limiting stenosis. Venous sinuses: Patent Anatomic variants: As above Delayed phase: No abnormal intracranial enhancement Review of the MIP images confirms the above findings IMPRESSION: 1. No emergent finding. 2. Mild for age atherosclerosis in the neck without flow limiting stenosis or evidence embolic source. 3. Known lung cancer. There is a right pleural effusion that was not seen on 03/23/2018 chest CT. Electronically Signed   By: JAngelica Chessman  Watts M.D.   On: 04/08/2018 13:23   Mr Jeri Cos HG Contrast  Result Date: 04/07/2018 CLINICAL DATA:  Lung cancer.  No symptoms.  Staging. EXAM: MRI HEAD WITHOUT AND WITH CONTRAST TECHNIQUE: Multiplanar, multiecho pulse sequences of the brain and surrounding structures were obtained without and with intravenous contrast. CONTRAST:  Gadavist 7 mL. COMPARISON:  PET scan 03/23/2018. FINDINGS: Brain: Two small foci of restricted diffusion, LEFT cerebellar cortex and white matter, corresponding low ADC, LEFT AICA territory, consistent with acute infarction. No underlying mass or vasogenic edema. No other similar findings. No hemorrhage, mass lesion, hydrocephalus, or extra-axial fluid. Premature for age atrophy. Chronic microvascular ischemic change of a mild-to-moderate nature affecting the white matter, primarily supratentorial. Post infusion imaging in all three planes demonstrates no abnormal enhancement of the brain or meninges. No intracranial metastatic disease is observed. Vascular: Flow voids are maintained. Specifically, the basilar artery, and both vertebral arteries, are patent. Cerebellar branches are not assessed as part of this examination, although a vessel in the expected region of the LEFT AICA demonstrates a flow void. Skull and upper  cervical spine: Unremarkable. Sinuses/Orbits: Negative. Other: None. IMPRESSION: Subcentimeter acute strokes LEFT cerebellum. The adjacent vertebral artery and basilar artery appear patent, so other causes of infarction such as atrial fibrillation or hypercoagulable state could be considered as an etiology. No supratentorial abnormalities are evident. Atrophy and small vessel disease. No intracranial metastatic disease is observed. These results were called by telephone at the time of interpretation on 04/07/2018 at 11:12 am to Dr. Astrid Divine Brittnay Pigman , who verbally acknowledged these results. Electronically Signed   By: Staci Righter M.D.   On: 04/07/2018 11:14   US Carotid Bilateral  Result Date: 04/08/2018 CLINICAL DATA:  CVA.  History of hypertension and smoking. EXAM: BILATERAL CAROTID DUPLEX ULTRASOUND TECHNIQUE: Pearline Cables scale imaging, color Doppler and duplex ultrasound were performed of bilateral carotid and vertebral arteries in the neck. COMPARISON:  None. FINDINGS: Criteria: Quantification of carotid stenosis is based on velocity parameters that correlate the residual internal carotid diameter with NASCET-based stenosis levels, using the diameter of the distal internal carotid lumen as the denominator for stenosis measurement. The following velocity measurements were obtained: RIGHT ICA:  109/35 cm/sec CCA:  99/24 cm/sec SYSTOLIC ICA/CCA RATIO:  1.1 ECA:  67 cm/sec LEFT ICA:  94/23 cm/sec CCA:  268/34 cm/sec SYSTOLIC ICA/CCA RATIO:  0.8 ECA:  109 cm/sec RIGHT CAROTID ARTERY: There is no grayscale evidence of significant intimal thickening or atherosclerotic plaque affecting the interrogated portions of the right carotid system. There are no elevated peak systolic velocities within the interrogated course of the right internal carotid artery to suggest a hemodynamically significant stenosis. RIGHT VERTEBRAL ARTERY:  Antegrade flow LEFT CAROTID ARTERY: There is no grayscale evidence of significant intimal  thickening or atherosclerotic plaque affecting the interrogated portions of the left carotid system. There are no elevated peak systolic velocities within the interrogated course of the left internal carotid artery to suggest a hemodynamically significant stenosis. LEFT VERTEBRAL ARTERY:  Antegrade flow IMPRESSION: Normal carotid Doppler ultrasound Electronically Signed   By: Sandi Mariscal M.D.   On: 04/08/2018 08:17   Nm Pet Image Initial (pi) Skull Base To Thigh  Result Date: 03/23/2018 CLINICAL DATA:  Initial treatment strategy for right lung mass on screening chest CT. EXAM: NUCLEAR MEDICINE PET SKULL BASE TO THIGH TECHNIQUE: 9.2 mCi F-18 FDG was injected intravenously. Full-ring PET imaging was performed from the skull base to thigh after the radiotracer. CT data was obtained  and used for attenuation correction and anatomic localization. Fasting blood glucose: 113 mg/dl COMPARISON:  03/17/2018 screening chest CT. FINDINGS: Mediastinal blood pool activity: SUV max 2.8 NECK: No hypermetabolic lymph nodes in the neck. Incidental CT findings: none CHEST: Right middle lobe 4.9 x 3.9 cm mass (series 4/image 117) is intensely hypermetabolic with max SUV 49.2. There is postobstructive pneumonia/atelectasis in the right middle lobe peripheral to the mass. There is hypermetabolic right hilar/infrahilar adenopathy with representative max SUV 16.4 in the right infrahilar region. Hypermetabolic 2.5 cm right subcarinal node with max SUV 16.0. Mildly hypermetabolic right paratracheal nodes, largest 1.0 cm with max SUV 4.0 (series 4/image 80). No hypermetabolic contralateral mediastinal, contralateral hilar or supraclavicular lymph nodes. Incidental CT findings: Left upper lobe 4 mm solid pulmonary nodule is below PET resolution (series 4/image 103). No additional significant pulmonary nodules. Trace dependent right pleural effusion. Coronary atherosclerosis. Atherosclerotic thoracic aorta with ectatic 4.2 cm ascending  thoracic aorta. Mild centrilobular and paraseptal emphysema. ABDOMEN/PELVIS: No abnormal hypermetabolic activity within the liver, pancreas, adrenal glands, or spleen. No hypermetabolic lymph nodes in the abdomen or pelvis. Incidental CT findings: Atherosclerotic nonaneurysmal abdominal aorta. Mildly enlarged prostate. SKELETON: No focal hypermetabolic activity to suggest skeletal metastasis. Incidental CT findings: none IMPRESSION: 1. Intensely hypermetabolic 4.9 cm right middle lobe lung mass, compatible with primary bronchogenic carcinoma. Postobstructive pneumonia/atelectasis in the right middle lobe. 2. Hypermetabolic ipsilateral hilar, subcarinal and ipsilateral paratracheal adenopathy. 3. No distant hypermetabolic metastatic disease. 4. PET-CT staging is T2b N2 M0, stage IIIa. 5. Trace dependent right pleural effusion. 6. Solid 0.4 cm left upper lobe pulmonary nodule is below PET resolution and indeterminate. Follow-up chest CT advised in 3 months. 7. Ectatic 4.2 cm ascending thoracic aorta. Recommend annual imaging followup by CTA or MRA. This recommendation follows 2010 ACCF/AHA/AATS/ACR/ASA/SCA/SCAI/SIR/STS/SVM Guidelines for the Diagnosis and Management of Patients with Thoracic Aortic Disease. Circulation. 2010; 121: E100-F121. 8. Aortic Atherosclerosis (ICD10-I70.0) and Emphysema (ICD10-J43.9). Electronically Signed   By: Ilona Sorrel M.D.   On: 03/23/2018 15:35     Assessment and plan- Patient is a 72 y.o. male with adenocarcinoma of the right middle lobe of the lung stage IIIB cT2 N3 M0.  He is here for on treatment assessment prior to cycle 1 of weekly carbotaxol chemotherapy  He starts radiation today.  Counts are otherwise okay to proceed with cycle 1 of weekly carbotaxol chemotherapy today.  Again discussed risks and benefits of chemotherapy including all but not limited to nausea, vomiting, fatigue, risk of infusion reactions, infections and hospitalizations as well as peripheral  neuropathy.  Patient understands and agrees to proceed as planned.  I will see her back in 1 week's time with CBC and CMP for cycle 2 of carbotaxol chemotherapy.  Recent stroke: He is currently on aspirin and Lipitor.  Stroke work-up was essentially unremarkable.  He will continue to follow-up with his primary care doctor for this   Visit Diagnosis 1. Malignant neoplasm of middle lobe of right lung ([place])   2. Encounter for antineoplastic chemotherapy      Dr. Randa Evens, MD, MPH [facility] [phone] 04/19/2018 1:06 PM

## 2018-04-19 NOTE — Progress Notes (Signed)
No new changes noted today 

## 2018-04-19 NOTE — Research (Signed)
Met with patient and his wife today in infusion suite to introduce research study:  Librarian, academic for the Discovery and Validation of Biomarkers for the Prediction, Diagnosis and Management of Disease  Philip - PIBobbye Riggs, MD  [facility]. Attending: Janese Banks I explained study to patient and wife.  I informed that study is asking for patient to give two vials of whole blood for use at [facility].  Patient had the opportunity to ask questions and were offered to take a copy of consent home for review.Patient and wife declined participation in the study. I thanked them for their time.  Lula Olszewski Oncology Research Assistant 04/19/2018 11:32 AM

## 2018-04-19 NOTE — Progress Notes (Signed)
Pt tolerated infusion well. Pt and VS stable at discharge.  

## 2018-04-20 ENCOUNTER — Encounter: Payer: Self-pay | Admitting: *Deleted

## 2018-04-20 ENCOUNTER — Ambulatory Visit: Payer: Medicare PPO

## 2018-04-20 ENCOUNTER — Ambulatory Visit
Admission: RE | Admit: 2018-04-20 | Discharge: 2018-04-20 | Disposition: A | Discharge status: Home / Self Care | Payer: Medicare PPO | Source: Ambulatory Visit | Attending: Radiation Oncology | Admitting: Radiation Oncology

## 2018-04-20 DIAGNOSIS — C342 Malignant neoplasm of middle lobe, bronchus or lung: Secondary | ICD-10-CM | POA: Diagnosis not present

## 2018-04-20 NOTE — Progress Notes (Signed)
  Oncology Nurse Navigator Documentation  Navigator Location: [facility] (04/20/18 1200)   )Navigator Encounter Type: Treatment (04/20/18 1200)                   Treatment Initiated Date: 04/19/18 (04/20/18 1200)   Treatment Phase: First Chemo Tx;First Radiation Tx (04/20/18 1200) Barriers/Navigation Needs: No barriers at this time (04/20/18 1200)   Interventions: None required (04/20/18 1200)                      Time Spent with Patient: 15 (04/20/18 1200)

## 2018-04-21 ENCOUNTER — Ambulatory Visit: Payer: Medicare PPO

## 2018-04-21 ENCOUNTER — Other Ambulatory Visit: Payer: Self-pay | Admitting: Oncology

## 2018-04-21 ENCOUNTER — Ambulatory Visit
Admission: RE | Admit: 2018-04-21 | Discharge: 2018-04-21 | Disposition: A | Discharge status: Home / Self Care | Payer: Medicare PPO | Source: Ambulatory Visit | Attending: Radiation Oncology | Admitting: Radiation Oncology

## 2018-04-21 DIAGNOSIS — C342 Malignant neoplasm of middle lobe, bronchus or lung: Secondary | ICD-10-CM | POA: Diagnosis not present

## 2018-04-22 ENCOUNTER — Ambulatory Visit: Payer: Medicare PPO

## 2018-04-22 ENCOUNTER — Ambulatory Visit
Admission: RE | Admit: 2018-04-22 | Discharge: 2018-04-22 | Disposition: A | Discharge status: Home / Self Care | Payer: Medicare PPO | Source: Ambulatory Visit | Attending: Radiation Oncology | Admitting: Radiation Oncology

## 2018-04-22 DIAGNOSIS — C342 Malignant neoplasm of middle lobe, bronchus or lung: Secondary | ICD-10-CM | POA: Diagnosis not present

## 2018-04-25 ENCOUNTER — Other Ambulatory Visit: Payer: Self-pay

## 2018-04-25 ENCOUNTER — Ambulatory Visit: Payer: Medicare PPO

## 2018-04-25 ENCOUNTER — Ambulatory Visit
Admission: RE | Admit: 2018-04-25 | Discharge: 2018-04-25 | Disposition: A | Discharge status: Home / Self Care | Payer: Medicare PPO | Source: Ambulatory Visit | Attending: Radiation Oncology | Admitting: Radiation Oncology

## 2018-04-25 DIAGNOSIS — R918 Other nonspecific abnormal finding of lung field: Secondary | ICD-10-CM

## 2018-04-25 DIAGNOSIS — C342 Malignant neoplasm of middle lobe, bronchus or lung: Secondary | ICD-10-CM | POA: Diagnosis not present

## 2018-04-26 ENCOUNTER — Inpatient Hospital Stay: Payer: Medicare PPO

## 2018-04-26 ENCOUNTER — Other Ambulatory Visit: Payer: Medicare PPO

## 2018-04-26 ENCOUNTER — Encounter: Payer: Self-pay | Admitting: Oncology

## 2018-04-26 ENCOUNTER — Ambulatory Visit: Payer: Medicare PPO

## 2018-04-26 ENCOUNTER — Ambulatory Visit
Admission: RE | Admit: 2018-04-26 | Discharge: 2018-04-26 | Disposition: A | Discharge status: Home / Self Care | Payer: Medicare PPO | Source: Ambulatory Visit | Attending: Radiation Oncology | Admitting: Radiation Oncology

## 2018-04-26 ENCOUNTER — Inpatient Hospital Stay (HOSPITAL_BASED_OUTPATIENT_CLINIC_OR_DEPARTMENT_OTHER): Payer: Medicare PPO | Admitting: Oncology

## 2018-04-26 ENCOUNTER — Telehealth: Payer: Self-pay | Admitting: *Deleted

## 2018-04-26 VITALS — BP 147/82 | HR 99 | Temp 98.0°F | Resp 18 | Ht 69.0 in | Wt 158.3 lb

## 2018-04-26 DIAGNOSIS — C342 Malignant neoplasm of middle lobe, bronchus or lung: Secondary | ICD-10-CM

## 2018-04-26 DIAGNOSIS — I10 Essential (primary) hypertension: Secondary | ICD-10-CM

## 2018-04-26 DIAGNOSIS — R918 Other nonspecific abnormal finding of lung field: Secondary | ICD-10-CM

## 2018-04-26 DIAGNOSIS — F17210 Nicotine dependence, cigarettes, uncomplicated: Secondary | ICD-10-CM

## 2018-04-26 DIAGNOSIS — Z5111 Encounter for antineoplastic chemotherapy: Secondary | ICD-10-CM | POA: Diagnosis not present

## 2018-04-26 DIAGNOSIS — Z8673 Personal history of transient ischemic attack (TIA), and cerebral infarction without residual deficits: Secondary | ICD-10-CM

## 2018-04-26 DIAGNOSIS — R5383 Other fatigue: Secondary | ICD-10-CM | POA: Diagnosis not present

## 2018-04-26 DIAGNOSIS — Z7982 Long term (current) use of aspirin: Secondary | ICD-10-CM

## 2018-04-26 DIAGNOSIS — R5381 Other malaise: Secondary | ICD-10-CM

## 2018-04-26 DIAGNOSIS — Z79899 Other long term (current) drug therapy: Secondary | ICD-10-CM

## 2018-04-26 LAB — COMPREHENSIVE METABOLIC PANEL
ALT: 14 U/L (ref 0–44)
ANION GAP: 7 (ref 5–15)
AST: 14 U/L — ABNORMAL LOW (ref 15–41)
Albumin: 2.8 g/dL — ABNORMAL LOW (ref 3.5–5.0)
Alkaline Phosphatase: 72 U/L (ref 38–126)
BILIRUBIN TOTAL: 0.8 mg/dL (ref 0.3–1.2)
BUN: 12 mg/dL (ref 8–23)
CO2: 27 mmol/L (ref 22–32)
Calcium: 8.8 mg/dL — ABNORMAL LOW (ref 8.9–10.3)
Chloride: 100 mmol/L (ref 98–111)
Creatinine, Ser: 0.71 mg/dL (ref 0.61–1.24)
GFR calc Af Amer: >60 mL/min (ref >60)
Glucose, Bld: 115 mg/dL — ABNORMAL HIGH (ref 70–99)
POTASSIUM: 3.4 mmol/L — AB (ref 3.5–5.1)
Sodium: 134 mmol/L — ABNORMAL LOW (ref 135–145)
TOTAL PROTEIN: 6.7 g/dL (ref 6.5–8.1)

## 2018-04-26 LAB — CBC WITH DIFFERENTIAL/PLATELET
Abs Immature Granulocytes: 0.07 10*3/uL (ref 0.00–0.07)
BASOS ABS: 0 10*3/uL (ref 0.0–0.1)
BASOS PCT: 0 %
Eosinophils Absolute: 0.2 10*3/uL (ref 0.0–0.5)
Eosinophils Relative: 3 %
HCT: 30.2 % — ABNORMAL LOW (ref 39.0–52.0)
Hemoglobin: 9.8 g/dL — ABNORMAL LOW (ref 13.0–17.0)
IMMATURE GRANULOCYTES: 1 %
Lymphocytes Relative: 13 %
Lymphs Abs: 1.2 10*3/uL (ref 0.7–4.0)
MCH: 31 pg (ref 26.0–34.0)
MCHC: 32.5 g/dL (ref 30.0–36.0)
MCV: 95.6 fL (ref 80.0–100.0)
MONO ABS: 1.2 10*3/uL — AB (ref 0.1–1.0)
Monocytes Relative: 12 %
NEUTROS ABS: 6.7 10*3/uL (ref 1.7–7.7)
NEUTROS PCT: 71 %
NRBC: 0 % (ref 0.0–0.2)
PLATELETS: 170 10*3/uL (ref 150–400)
RBC: 3.16 MIL/uL — AB (ref 4.22–5.81)
RDW: 13.4 % (ref 11.5–15.5)
WBC: 9.3 10*3/uL (ref 4.0–10.5)

## 2018-04-26 NOTE — Progress Notes (Signed)
Hematology/Oncology Consult note [facility]  [phone] [phone]  Patient Care Team: Center, [facility] as PCP - General ([place]) Telford Nab, RN as Registered Nurse   Name of the patient: Philip Weiss  [id number]  09/17/1945   Date of visit: 04/26/18  Diagnosis- adenocarcinoma of the right lung stage III cT2 N3 M0  Chief complaint/ Reason for visit-on treatment assessment prior to cycle 2 of weekly carbotaxol chemotherapy  Heme/Onc history: patient is a 72 year old male with a long-standing history of smoking and is a current smoker with a 58-pack-year smoking history. He underwent lung cancer screening low-dose CT scan which showed a right middle lobe lung mass of about 4.8 cm. Subcarinal adenopathy 2.8 x 4 cm and probable right hilar adenopathy and soft tissue fullness 2.3 cm. This was followed by a PET CT scan which showed right middle lobe mass was hypermetabolic at 62.1 with hypermetabolic right hilar and infrahilar adenopathy. Hypermetabolic 2.5 cm subcarinal node and right paratracheal nodes. No hypermetabolic contralateral adenopathy. Left upper lobe 4 mm solitary pulmonary nodule below PET resolution. No evidence of hypermetabolic them elsewhere.  Biopsy of the right middle lobe showed non-small cell carcinoma favor adenocarcinoma. 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  However it showed acute cerebellar stroke for which patient was admitted to the hospital and underwent a work-up including carotid Dopplers as well as CTA of the head and neck and echo with bubble study which were unremarkable   Interval history-he tolerated cycle 1 of chemotherapy as well as 1 week of radiation well so far.  Denies any cough or shortness of breath.  Denies any significant pain.  ECOG PS-  1 Pain scale- 0 Opioid associated constipation- no  Review of systems- Review of Systems  Constitutional: Positive for malaise/fatigue. Negative for chills, fever and weight loss.  HENT: Negative for congestion, ear discharge and nosebleeds.   Eyes: Negative for blurred vision.  Respiratory: Negative for cough, hemoptysis, sputum production, shortness of breath and wheezing.   Cardiovascular: Negative for chest pain, palpitations, orthopnea and claudication.  Gastrointestinal: Negative for abdominal pain, blood in stool, constipation, diarrhea, heartburn, melena, nausea and vomiting.  Genitourinary: Negative for dysuria, flank pain, frequency, hematuria and urgency.  Musculoskeletal: Negative for back pain, joint pain and myalgias.  Skin: Negative for rash.  Neurological: Negative for dizziness, tingling, focal weakness, seizures, weakness and headaches.  Endo/Heme/Allergies: Does not bruise/bleed easily.  Psychiatric/Behavioral: Negative for depression and suicidal ideas. The patient does not have insomnia.      Allergies  Allergen Reactions  . Aspirin Itching     Past Medical History:  Diagnosis Date  . Hypertension   . Lung cancer [facility])      Past Surgical History:  Procedure Laterality Date  . CYST EXCISION Left    buttock  . ENDOBRONCHIAL ULTRASOUND Right 03/29/2018   Procedure: ENDOBRONCHIAL ULTRASOUND;  Surgeon: Tyler Pita, MD;  Location: [facility] ORS;  Service: Cardiopulmonary;  Laterality: Right;  . FLEXIBLE BRONCHOSCOPY Right 03/29/2018   Procedure: FLEXIBLE BRONCHOSCOPY;  Surgeon: Tyler Pita, MD;  Location: [facility] ORS;  Service: Cardiopulmonary;  Laterality: Right;  . PORTA CATH INSERTION N/A 04/13/2018   Procedure: PORTA CATH INSERTION;  Surgeon: Algernon Huxley, MD;  Location: [facility] [facility];  Service: Cardiovascular;  Laterality: N/A;    Social History   Socioeconomic History  .  Marital status: Married    Spouse name: Not on file  . Number of  children: 4  . Years of education: Not on file  . Highest education level: Not on file  Occupational History  . Occupation: retired    Comment: mill/construction   Social Needs  . Financial resource strain: Not hard at all  . Food insecurity:    Worry: Never true    Inability: Never true  . Transportation needs:    Medical: No    Non-medical: No  Tobacco Use  . Smoking status: Former Smoker    Packs/day: 1.00    Years: 58.00    Pack years: 58.00    Types: Cigarettes    Last attempt to quit: 03/30/2018    Years since quitting: 0.0  . Smokeless tobacco: Never Used  Substance and Sexual Activity  . Alcohol use: Not Currently    Comment: daily 32 oz beer  . Drug use: Not Currently  . Sexual activity: Not on file  Lifestyle  . Physical activity:    Days per week: 0 days    Minutes per session: 0 min  . Stress: Only a little  Relationships  . Social connections:    Talks on phone: More than three times a week    Gets together: Twice a week    Attends religious service: More than 4 times per year    Active member of club or organization: Not on file    Attends meetings of clubs or organizations: Not on file    Relationship status: Married  . Intimate partner violence:    Fear of current or ex partner: No    Emotionally abused: No    Physically abused: No    Forced sexual activity: No  Other Topics Concern  . Not on file  Social History Narrative  . Not on file    Family History  Problem Relation Age of Onset  . Diabetes Mother   . CAD Mother   . Stroke Father      Current Outpatient Medications:  .  aspirin EC 81 MG EC tablet, Take 1 tablet (81 mg total) by mouth daily., Disp: 30 tablet, Rfl: 1 .  atorvastatin (LIPITOR) 40 MG tablet, Take 1 tablet (40 mg total) by mouth daily at 6 PM., Disp: 30 tablet, Rfl: 1 .  budesonide-formoterol (SYMBICORT) 160-4.5 MCG/ACT inhaler, Inhale 2 puffs into the lungs 2 (two) times daily., Disp: , Rfl:  .  Cyanocobalamin  (VITAMIN B-12 PO), Take 1 tablet by mouth daily., Disp: , Rfl:  .  losartan (COZAAR) 25 MG tablet, Take 25 mg by mouth daily. , Disp: , Rfl:  .  Multiple Vitamins-Minerals (MULTIVITAMIN PO), Take 1 tablet by mouth daily., Disp: , Rfl:  .  Pyridoxine HCl (VITAMIN B-6 PO), Take 1 tablet by mouth daily., Disp: , Rfl:  .  Spacer/Aero-Holding Chambers DEVI, 1 each by Does not apply route daily., Disp: 1 each, Rfl: 0 .  dexamethasone (DECADRON) 4 MG tablet, Take 2 tablets (8 mg total) by mouth daily. Start the day after chemotherapy for 2 days. (Patient not taking: Reported on 04/19/2018), Disp: 30 tablet, Rfl: 1 .  LORazepam (ATIVAN) 0.5 MG tablet, Take 1 tablet (0.5 mg total) by mouth every 6 (six) hours as needed (Nausea or vomiting). (Patient not taking: Reported on 04/19/2018), Disp: 30 tablet, Rfl: 0 .  ondansetron (ZOFRAN) 8 MG tablet, Take 1 tablet (8 mg total) by mouth 2 (two) times daily as needed for refractory  nausea / vomiting. Start on day 3 after chemo. (Patient not taking: Reported on 04/19/2018), Disp: 30 tablet, Rfl: 1 .  prochlorperazine (COMPAZINE) 10 MG tablet, Take 1 tablet (10 mg total) by mouth every 6 (six) hours as needed (Nausea or vomiting). (Patient not taking: Reported on 04/19/2018), Disp: 30 tablet, Rfl: 1  Physical exam:  Vitals:   04/26/18 1055  BP: (!) 147/82  Pulse: 99  Resp: 18  Temp: 98 F (36.7 C)  TempSrc: Oral  SpO2: 95%  Weight: 158 lb 4.8 oz (71.8 kg)  Height: _0  (1.753 m)   Physical Exam  Constitutional: He is oriented to person, place, and time.  Thin elderly gentleman in no acute distress  HENT:  Head: Normocephalic and atraumatic.  Eyes: Pupils are equal, round, and reactive to light. EOM are normal.  Neck: Normal range of motion.  Cardiovascular: Normal rate, regular rhythm and normal heart sounds.  Pulmonary/Chest: Effort normal and breath sounds normal.  Abdominal: Soft. Bowel sounds are normal.  Neurological: He is alert and oriented to  person, place, and time.  Skin: Skin is warm and dry.     CMP Latest Ref Rng & Units 04/26/2018  Glucose 70 - 99 mg/dL 115(H)  BUN 8 - 23 mg/dL 12  Creatinine 0.61 - 1.24 mg/dL 0.71  Sodium 135 - 145 mmol/L 134(L)  Potassium 3.5 - 5.1 mmol/L 3.4(L)  Chloride 98 - 111 mmol/L 100  CO2 22 - 32 mmol/L 27  Calcium 8.9 - 10.3 mg/dL 8.8(L)  Total Protein 6.5 - 8.1 g/dL 6.7  Total Bilirubin 0.3 - 1.2 mg/dL 0.8  Alkaline Phos 38 - 126 U/L 72  AST 15 - 41 U/L 14(L)  ALT 0 - 44 U/L 14   CBC Latest Ref Rng & Units 04/26/2018  WBC 4.0 - 10.5 K/uL 9.3  Hemoglobin 13.0 - 17.0 g/dL 9.8(L)  Hematocrit 39.0 - 52.0 % 30.2(L)  Platelets 150 - 400 K/uL 170    No images are attached to the encounter.  Ct Angio Head W Or Wo Contrast  Result Date: 04/08/2018 CLINICAL DATA:  Stroke follow-up EXAM: CT ANGIOGRAPHY HEAD AND NECK TECHNIQUE: Multidetector CT imaging of the head and neck was performed using the standard protocol during bolus administration of intravenous contrast. Multiplanar CT image reconstructions and MIPs were obtained to evaluate the vascular anatomy. Carotid stenosis measurements (when applicable) are obtained utilizing NASCET criteria, using the distal internal carotid diameter as the denominator. CONTRAST:  75m ISOVUE-370 IOPAMIDOL (ISOVUE-370) INJECTION 76% COMPARISON:  Brain MRI from yesterday. FINDINGS: CT HEAD FINDINGS Brain: Acute infarcts are not clearly visible. No evidence of progressive infarct. No hemorrhage or hydrocephalus. Vascular: See below Skull: Negative Sinuses: Negative Orbits: Negative Review of the MIP images confirms the above findings CTA NECK FINDINGS Aortic arch: Mild atherosclerotic plaque.  Three vessel branching. Right carotid system: No notable atheromatous changes. No beading or dissection. Left carotid system: The vessels are smooth and widely patent. No notable atheromatous changes. Vertebral arteries: No proximal subclavian stenosis or significant  atherosclerosis. There is mild to moderate narrowing at the origin of the right vertebral artery. Otherwise the vertebral arteries are smooth and widely patent. Skeleton: No acute or aggressive finding. Chronic superior endplate fractures of T1, T2, and T3. Osteopenia and cervical disc degeneration. Other neck: No significant finding Upper chest: Known intrathoracic malignancy with subcarinal adenopathy and a right pleural effusion that has developed since prior. Emphysema. Review of the MIP images confirms the above findings CTA HEAD FINDINGS Anterior  circulation: Moderate right M2 branch narrowing, presumably atheromatous. No branch occlusion or aneurysm. Posterior circulation: Codominant vertebral arteries. Hypoplastic right P1 segment. The vertebral and basilar arteries are smooth and diffusely patent. No branch occlusion or flow limiting stenosis. Venous sinuses: Patent Anatomic variants: As above Delayed phase: No abnormal intracranial enhancement Review of the MIP images confirms the above findings IMPRESSION: 1. No emergent finding. 2. Mild for age atherosclerosis in the neck without flow limiting stenosis or evidence embolic source. 3. Known lung cancer. There is a right pleural effusion that was not seen on 03/23/2018 chest CT. Electronically Signed   By: Monte Fantasia M.D.   On: 04/08/2018 13:23   Ct Angio Neck W Or Wo Contrast  Result Date: 04/08/2018 CLINICAL DATA:  Stroke follow-up EXAM: CT ANGIOGRAPHY HEAD AND NECK TECHNIQUE: Multidetector CT imaging of the head and neck was performed using the standard protocol during bolus administration of intravenous contrast. Multiplanar CT image reconstructions and MIPs were obtained to evaluate the vascular anatomy. Carotid stenosis measurements (when applicable) are obtained utilizing NASCET criteria, using the distal internal carotid diameter as the denominator. CONTRAST:  65m ISOVUE-370 IOPAMIDOL (ISOVUE-370) INJECTION 76% COMPARISON:  Brain MRI from  yesterday. FINDINGS: CT HEAD FINDINGS Brain: Acute infarcts are not clearly visible. No evidence of progressive infarct. No hemorrhage or hydrocephalus. Vascular: See below Skull: Negative Sinuses: Negative Orbits: Negative Review of the MIP images confirms the above findings CTA NECK FINDINGS Aortic arch: Mild atherosclerotic plaque.  Three vessel branching. Right carotid system: No notable atheromatous changes. No beading or dissection. Left carotid system: The vessels are smooth and widely patent. No notable atheromatous changes. Vertebral arteries: No proximal subclavian stenosis or significant atherosclerosis. There is mild to moderate narrowing at the origin of the right vertebral artery. Otherwise the vertebral arteries are smooth and widely patent. Skeleton: No acute or aggressive finding. Chronic superior endplate fractures of T1, T2, and T3. Osteopenia and cervical disc degeneration. Other neck: No significant finding Upper chest: Known intrathoracic malignancy with subcarinal adenopathy and a right pleural effusion that has developed since prior. Emphysema. Review of the MIP images confirms the above findings CTA HEAD FINDINGS Anterior circulation: Moderate right M2 branch narrowing, presumably atheromatous. No branch occlusion or aneurysm. Posterior circulation: Codominant vertebral arteries. Hypoplastic right P1 segment. The vertebral and basilar arteries are smooth and diffusely patent. No branch occlusion or flow limiting stenosis. Venous sinuses: Patent Anatomic variants: As above Delayed phase: No abnormal intracranial enhancement Review of the MIP images confirms the above findings IMPRESSION: 1. No emergent finding. 2. Mild for age atherosclerosis in the neck without flow limiting stenosis or evidence embolic source. 3. Known lung cancer. There is a right pleural effusion that was not seen on 03/23/2018 chest CT. Electronically Signed   By: JMonte FantasiaM.D.   On: 04/08/2018 13:23   Mr BJeri CosWPQContrast  Result Date: 04/07/2018 CLINICAL DATA:  Lung cancer.  No symptoms.  Staging. EXAM: MRI HEAD WITHOUT AND WITH CONTRAST TECHNIQUE: Multiplanar, multiecho pulse sequences of the brain and surrounding structures were obtained without and with intravenous contrast. CONTRAST:  Gadavist 7 mL. COMPARISON:  PET scan 03/23/2018. FINDINGS: Brain: Two small foci of restricted diffusion, LEFT cerebellar cortex and white matter, corresponding low ADC, LEFT AICA territory, consistent with acute infarction. No underlying mass or vasogenic edema. No other similar findings. No hemorrhage, mass lesion, hydrocephalus, or extra-axial fluid. Premature for age atrophy. Chronic microvascular ischemic change of a mild-to-moderate nature affecting the white matter, primarily  supratentorial. Post infusion imaging in all three planes demonstrates no abnormal enhancement of the brain or meninges. No intracranial metastatic disease is observed. Vascular: Flow voids are maintained. Specifically, the basilar artery, and both vertebral arteries, are patent. Cerebellar branches are not assessed as part of this examination, although a vessel in the expected region of the LEFT AICA demonstrates a flow void. Skull and upper cervical spine: Unremarkable. Sinuses/Orbits: Negative. Other: None. IMPRESSION: Subcentimeter acute strokes LEFT cerebellum. The adjacent vertebral artery and basilar artery appear patent, so other causes of infarction such as atrial fibrillation or hypercoagulable state could be considered as an etiology. No supratentorial abnormalities are evident. Atrophy and small vessel disease. No intracranial metastatic disease is observed. These results were called by telephone at the time of interpretation on 04/07/2018 at 11:12 am to Dr. Astrid Divine Lania Zawistowski , who verbally acknowledged these results. Electronically Signed   By: Staci Righter M.D.   On: 04/07/2018 11:14   US Carotid Bilateral  Result Date: 04/08/2018 CLINICAL  DATA:  CVA.  History of hypertension and smoking. EXAM: BILATERAL CAROTID DUPLEX ULTRASOUND TECHNIQUE: Pearline Cables scale imaging, color Doppler and duplex ultrasound were performed of bilateral carotid and vertebral arteries in the neck. COMPARISON:  None. FINDINGS: Criteria: Quantification of carotid stenosis is based on velocity parameters that correlate the residual internal carotid diameter with NASCET-based stenosis levels, using the diameter of the distal internal carotid lumen as the denominator for stenosis measurement. The following velocity measurements were obtained: RIGHT ICA:  109/35 cm/sec CCA:  90/30 cm/sec SYSTOLIC ICA/CCA RATIO:  1.1 ECA:  67 cm/sec LEFT ICA:  94/23 cm/sec CCA:  092/33 cm/sec SYSTOLIC ICA/CCA RATIO:  0.8 ECA:  109 cm/sec RIGHT CAROTID ARTERY: There is no grayscale evidence of significant intimal thickening or atherosclerotic plaque affecting the interrogated portions of the right carotid system. There are no elevated peak systolic velocities within the interrogated course of the right internal carotid artery to suggest a hemodynamically significant stenosis. RIGHT VERTEBRAL ARTERY:  Antegrade flow LEFT CAROTID ARTERY: There is no grayscale evidence of significant intimal thickening or atherosclerotic plaque affecting the interrogated portions of the left carotid system. There are no elevated peak systolic velocities within the interrogated course of the left internal carotid artery to suggest a hemodynamically significant stenosis. LEFT VERTEBRAL ARTERY:  Antegrade flow IMPRESSION: Normal carotid Doppler ultrasound Electronically Signed   By: Sandi Mariscal M.D.   On: 04/08/2018 08:17     Assessment and plan- Patient is a 72 y.o. male with adenocarcinoma of the right middle lobe of the lung stage IIIBcT2 N3M0.  He is here for on treatment assessment prior to cycle 2 of weekly carbotaxol chemotherapy  Counts are okay to proceed with cycle 2 of weekly carbotaxol chemotherapy which she  will be getting tomorrow due to lack of chair time today.  He will directly proceed for cycle 3 next week and I will see him back in 2 weeks with CBC and CMP prior to cycle 4 of chemotherapy.  Plan is to complete concurrent chemoradiation followed by repeat scans    Visit Diagnosis 1. Encounter for antineoplastic chemotherapy   2. Malignant neoplasm of middle lobe of right lung [facility])      Dr. Randa Evens, MD, MPH [facility] [phone] 04/26/2018 12:37 PM

## 2018-04-26 NOTE — Telephone Encounter (Signed)
Called patients house and spoke with wife about K level of 3.4. Asked wife if patient could eat more bananas, oranges, orange juice, potatoes, and green leafy vegetables. Wife states he likes all of those things and will add them to diet.

## 2018-04-26 NOTE — Progress Notes (Signed)
No new changes noted today 

## 2018-04-27 ENCOUNTER — Ambulatory Visit: Payer: Medicare PPO

## 2018-04-27 ENCOUNTER — Ambulatory Visit
Admission: RE | Admit: 2018-04-27 | Discharge: 2018-04-27 | Disposition: A | Discharge status: Home / Self Care | Payer: Medicare PPO | Source: Ambulatory Visit | Attending: Radiation Oncology | Admitting: Radiation Oncology

## 2018-04-27 ENCOUNTER — Inpatient Hospital Stay: Payer: Medicare PPO

## 2018-04-27 VITALS — BP 119/76 | HR 96 | Temp 96.1°F | Resp 20 | Wt 158.3 lb

## 2018-04-27 DIAGNOSIS — Z5111 Encounter for antineoplastic chemotherapy: Secondary | ICD-10-CM | POA: Diagnosis not present

## 2018-04-27 DIAGNOSIS — C342 Malignant neoplasm of middle lobe, bronchus or lung: Secondary | ICD-10-CM | POA: Diagnosis not present

## 2018-04-27 MED ORDER — PALONOSETRON HCL INJECTION 0.25 MG/5ML
0.2500 mg | Freq: Once | INTRAVENOUS | Status: AC
  Administered 2018-04-27: 0.25 mg via INTRAVENOUS
  Filled 2018-04-27: qty 5

## 2018-04-27 MED ORDER — SODIUM CHLORIDE 0.9 % IV SOLN
45.0000 mg/m2 | Freq: Once | INTRAVENOUS | Status: AC
  Administered 2018-04-27: 84 mg via INTRAVENOUS
  Filled 2018-04-27: qty 14

## 2018-04-27 MED ORDER — SODIUM CHLORIDE 0.9 % IV SOLN
20.0000 mg | Freq: Once | INTRAVENOUS | Status: AC
  Administered 2018-04-27: 20 mg via INTRAVENOUS
  Filled 2018-04-27: qty 2

## 2018-04-27 MED ORDER — HEPARIN SOD (PORK) LOCK FLUSH 100 UNIT/ML IV SOLN
500.0000 [IU] | Freq: Once | INTRAVENOUS | Status: AC | PRN
  Administered 2018-04-27: 500 [IU]
  Filled 2018-04-27: qty 5

## 2018-04-27 MED ORDER — SODIUM CHLORIDE 0.9 % IV SOLN
180.6000 mg | Freq: Once | INTRAVENOUS | Status: AC
  Administered 2018-04-27: 180 mg via INTRAVENOUS
  Filled 2018-04-27: qty 18

## 2018-04-27 MED ORDER — SODIUM CHLORIDE 0.9 % IV SOLN
Freq: Once | INTRAVENOUS | Status: AC
  Administered 2018-04-27: 10:00:00 via INTRAVENOUS
  Filled 2018-04-27: qty 250

## 2018-04-27 MED ORDER — FAMOTIDINE IN NACL 20-0.9 MG/50ML-% IV SOLN
20.0000 mg | Freq: Once | INTRAVENOUS | Status: AC
  Administered 2018-04-27: 20 mg via INTRAVENOUS
  Filled 2018-04-27: qty 50

## 2018-04-27 MED ORDER — DIPHENHYDRAMINE HCL 50 MG/ML IJ SOLN
50.0000 mg | Freq: Once | INTRAMUSCULAR | Status: AC
  Administered 2018-04-27: 50 mg via INTRAVENOUS
  Filled 2018-04-27: qty 1

## 2018-04-27 MED ORDER — SODIUM CHLORIDE 0.9% FLUSH
10.0000 mL | INTRAVENOUS | Status: AC | PRN
  Filled 2018-04-27: qty 10

## 2018-04-28 ENCOUNTER — Ambulatory Visit
Admission: RE | Admit: 2018-04-28 | Discharge: 2018-04-28 | Disposition: A | Discharge status: Home / Self Care | Payer: Medicare PPO | Source: Ambulatory Visit | Attending: Radiation Oncology | Admitting: Radiation Oncology

## 2018-04-28 ENCOUNTER — Ambulatory Visit: Payer: Medicare PPO

## 2018-04-28 DIAGNOSIS — C342 Malignant neoplasm of middle lobe, bronchus or lung: Secondary | ICD-10-CM | POA: Diagnosis not present

## 2018-04-29 ENCOUNTER — Ambulatory Visit
Admission: RE | Admit: 2018-04-29 | Discharge: 2018-04-29 | Disposition: A | Discharge status: Home / Self Care | Payer: Medicare PPO | Source: Ambulatory Visit | Attending: Radiation Oncology | Admitting: Radiation Oncology

## 2018-04-29 ENCOUNTER — Ambulatory Visit: Payer: Medicare PPO

## 2018-04-29 DIAGNOSIS — C342 Malignant neoplasm of middle lobe, bronchus or lung: Secondary | ICD-10-CM | POA: Diagnosis not present

## 2018-05-02 ENCOUNTER — Ambulatory Visit: Payer: Medicare PPO

## 2018-05-02 ENCOUNTER — Ambulatory Visit
Admission: RE | Admit: 2018-05-02 | Discharge: 2018-05-02 | Disposition: A | Discharge status: Home / Self Care | Payer: Medicare PPO | Source: Ambulatory Visit | Attending: Radiation Oncology | Admitting: Radiation Oncology

## 2018-05-02 DIAGNOSIS — C342 Malignant neoplasm of middle lobe, bronchus or lung: Secondary | ICD-10-CM | POA: Diagnosis not present

## 2018-05-03 ENCOUNTER — Ambulatory Visit
Admission: RE | Admit: 2018-05-03 | Discharge: 2018-05-03 | Disposition: A | Discharge status: Home / Self Care | Payer: Medicare PPO | Source: Ambulatory Visit | Attending: Radiation Oncology | Admitting: Radiation Oncology

## 2018-05-03 ENCOUNTER — Inpatient Hospital Stay: Payer: Medicare PPO

## 2018-05-03 ENCOUNTER — Ambulatory Visit: Payer: Medicare PPO | Admitting: Oncology

## 2018-05-03 ENCOUNTER — Other Ambulatory Visit: Payer: Self-pay | Admitting: *Deleted

## 2018-05-03 ENCOUNTER — Ambulatory Visit: Payer: Medicare PPO

## 2018-05-03 VITALS — BP 145/88 | HR 93 | Temp 97.5°F | Resp 20 | Wt 160.2 lb

## 2018-05-03 DIAGNOSIS — C342 Malignant neoplasm of middle lobe, bronchus or lung: Secondary | ICD-10-CM

## 2018-05-03 DIAGNOSIS — Z5111 Encounter for antineoplastic chemotherapy: Secondary | ICD-10-CM | POA: Diagnosis not present

## 2018-05-03 LAB — COMPREHENSIVE METABOLIC PANEL
ALBUMIN: 2.5 g/dL — AB (ref 3.5–5.0)
ALT: 16 U/L (ref 0–44)
AST: 14 U/L — AB (ref 15–41)
Alkaline Phosphatase: 58 U/L (ref 38–126)
Anion gap: 3 — ABNORMAL LOW (ref 5–15)
BILIRUBIN TOTAL: 0.6 mg/dL (ref 0.3–1.2)
BUN: 7 mg/dL — AB (ref 8–23)
CO2: 26 mmol/L (ref 22–32)
Calcium: 8.5 mg/dL — ABNORMAL LOW (ref 8.9–10.3)
Chloride: 101 mmol/L (ref 98–111)
Creatinine, Ser: 0.62 mg/dL (ref 0.61–1.24)
GFR calc Af Amer: >60 mL/min (ref >60)
GLUCOSE: 130 mg/dL — AB (ref 70–99)
POTASSIUM: 3.5 mmol/L (ref 3.5–5.1)
Sodium: 130 mmol/L — ABNORMAL LOW (ref 135–145)
TOTAL PROTEIN: 6 g/dL — AB (ref 6.5–8.1)

## 2018-05-03 LAB — CBC WITH DIFFERENTIAL/PLATELET
ABS IMMATURE GRANULOCYTES: 0.06 10*3/uL (ref 0.00–0.07)
BASOS ABS: 0 10*3/uL (ref 0.0–0.1)
Basophils Relative: 0 %
Eosinophils Absolute: 0.1 10*3/uL (ref 0.0–0.5)
Eosinophils Relative: 2 %
HEMATOCRIT: 25.4 % — AB (ref 39.0–52.0)
HEMOGLOBIN: 8.4 g/dL — AB (ref 13.0–17.0)
IMMATURE GRANULOCYTES: 1 %
LYMPHS ABS: 0.6 10*3/uL — AB (ref 0.7–4.0)
LYMPHS PCT: 11 %
MCH: 31 pg (ref 26.0–34.0)
MCHC: 33.1 g/dL (ref 30.0–36.0)
MCV: 93.7 fL (ref 80.0–100.0)
Monocytes Absolute: 0.2 10*3/uL (ref 0.1–1.0)
Monocytes Relative: 3 %
NEUTROS ABS: 4.6 10*3/uL (ref 1.7–7.7)
NEUTROS PCT: 83 %
Platelets: 190 10*3/uL (ref 150–400)
RBC: 2.71 MIL/uL — ABNORMAL LOW (ref 4.22–5.81)
RDW: 13.7 % (ref 11.5–15.5)
WBC: 5.6 10*3/uL (ref 4.0–10.5)
nRBC: 0 % (ref 0.0–0.2)

## 2018-05-03 MED ORDER — FAMOTIDINE IN NACL 20-0.9 MG/50ML-% IV SOLN
20.0000 mg | Freq: Once | INTRAVENOUS | Status: AC
  Administered 2018-05-03: 20 mg via INTRAVENOUS
  Filled 2018-05-03: qty 50

## 2018-05-03 MED ORDER — SODIUM CHLORIDE 0.9 % IV SOLN
180.0000 mg | Freq: Once | INTRAVENOUS | Status: AC
  Administered 2018-05-03: 180 mg via INTRAVENOUS
  Filled 2018-05-03: qty 18

## 2018-05-03 MED ORDER — SODIUM CHLORIDE 0.9 % IV SOLN
Freq: Once | INTRAVENOUS | Status: AC
  Administered 2018-05-03: 10:00:00 via INTRAVENOUS
  Filled 2018-05-03: qty 250

## 2018-05-03 MED ORDER — HEPARIN SOD (PORK) LOCK FLUSH 100 UNIT/ML IV SOLN
500.0000 [IU] | Freq: Once | INTRAVENOUS | Status: AC | PRN
  Administered 2018-05-03: 500 [IU]
  Filled 2018-05-03: qty 5

## 2018-05-03 MED ORDER — SODIUM CHLORIDE 0.9 % IV SOLN
45.0000 mg/m2 | Freq: Once | INTRAVENOUS | Status: AC
  Administered 2018-05-03: 84 mg via INTRAVENOUS
  Filled 2018-05-03: qty 14

## 2018-05-03 MED ORDER — DIPHENHYDRAMINE HCL 50 MG/ML IJ SOLN
50.0000 mg | Freq: Once | INTRAMUSCULAR | Status: AC
  Administered 2018-05-03: 50 mg via INTRAVENOUS
  Filled 2018-05-03: qty 1

## 2018-05-03 MED ORDER — SODIUM CHLORIDE 0.9 % IV SOLN
20.0000 mg | Freq: Once | INTRAVENOUS | Status: AC
  Administered 2018-05-03: 20 mg via INTRAVENOUS
  Filled 2018-05-03: qty 2

## 2018-05-03 MED ORDER — PALONOSETRON HCL INJECTION 0.25 MG/5ML
0.2500 mg | Freq: Once | INTRAVENOUS | Status: AC
  Administered 2018-05-03: 0.25 mg via INTRAVENOUS
  Filled 2018-05-03: qty 5

## 2018-05-04 ENCOUNTER — Ambulatory Visit: Payer: Medicare PPO

## 2018-05-04 ENCOUNTER — Ambulatory Visit: Payer: Medicare PPO | Admitting: Pulmonary Disease

## 2018-05-04 ENCOUNTER — Encounter: Payer: Self-pay | Admitting: Pulmonary Disease

## 2018-05-04 ENCOUNTER — Ambulatory Visit
Admission: RE | Admit: 2018-05-04 | Discharge: 2018-05-04 | Disposition: A | Discharge status: Home / Self Care | Payer: Medicare PPO | Source: Ambulatory Visit | Attending: Radiation Oncology | Admitting: Radiation Oncology

## 2018-05-04 VITALS — BP 132/72 | HR 100 | Ht 69.0 in | Wt 166.4 lb

## 2018-05-04 DIAGNOSIS — Z789 Other specified health status: Secondary | ICD-10-CM

## 2018-05-04 DIAGNOSIS — C3491 Malignant neoplasm of unspecified part of right bronchus or lung: Secondary | ICD-10-CM

## 2018-05-04 DIAGNOSIS — J449 Chronic obstructive pulmonary disease, unspecified: Secondary | ICD-10-CM

## 2018-05-04 DIAGNOSIS — C342 Malignant neoplasm of middle lobe, bronchus or lung: Secondary | ICD-10-CM | POA: Diagnosis not present

## 2018-05-04 DIAGNOSIS — I639 Cerebral infarction, unspecified: Secondary | ICD-10-CM | POA: Diagnosis not present

## 2018-05-04 MED ORDER — ALBUTEROL SULFATE HFA 108 (90 BASE) MCG/ACT IN AERS: 2.0000 | INHALATION_SPRAY | Freq: Four times a day (QID) | RESPIRATORY_TRACT | 2 refills | Status: DC | PRN

## 2018-05-04 NOTE — Patient Instructions (Signed)
1) We have sent in a prescription for an emergency inhaler that he can use up to 4 times a day as needed for shortness of breath or cough.  2) continue using your Symbicort 2 puffs twice a day morning and evening.  3) we will see you back in 3 months time, please call us before that if you have any problems.

## 2018-05-04 NOTE — Progress Notes (Signed)
Subjective:    Patient ID: Philip Weiss, male    DOB: May 29, 1946, 72 y.o.   MRN: [id number]  HPI The patient is a 72 year old recent former smoker smoker (10/23),diagnosed with non-small cell carcinoma of the lung, favor adenocarcinoma.  He follows here for COPD.  He was seen here on 30 October after bronchoscopy which was performed on 22 October.  Since that visit he has been admitted to the hospital for evaluation of acute cerebellar subcentimeter strokes.  No specific etiology could be noted for the strokes these were found incidentally on  MRI performed for evaluation of potential brain metastases.  The patient has been asymptomatic in this regard.  Potential etiologies include hypercoagulable state or atrial fibrillation.  The patient has had a negative cardiovascular work-up.  As noted, he remains asymptomatic in this regard.  He has not had any hemoptysis.  Previously he was noted to have issues with cough these have been improving since he started chemotherapy.  He has had some dyspnea but not debilitating. He uses Symbicort with a spacer and notes that this is helping him.  Occasionally he does have some issues with dyspnea and cough during the day.  He had a Port-A-Cath placed and he is getting cancer treatment under the direction of Dr. Janese Banks.  His appetite and weight remains stable.  He has not had any fevers, chills or sweats.  Voices no other complaints.   Review of Systems  Constitutional: Negative.   HENT: Negative.   Eyes: Negative.   Respiratory: Positive for cough (Nonproductive, rare) and shortness of breath (Mild).   Cardiovascular: Negative.   Gastrointestinal: Negative.   Endocrine: Negative.   Genitourinary: Negative.   Musculoskeletal: Negative.   Skin: Negative.   Allergic/Immunologic: Negative.   Neurological: Positive for headaches.  Hematological: Negative.   Psychiatric/Behavioral: Negative.   All other systems reviewed and are negative.      Objective:   Physical Exam  Constitutional: He is oriented to person, place, and time. He appears well-developed.  Non-toxic appearance. He does not appear ill. No distress.  Thin, African-American gentleman in no acute distress.  HENT:  Head: Normocephalic and atraumatic.  Mouth/Throat: Oropharynx is clear and moist.  Poor dentition, missing teeth.  Eyes: Pupils are equal, round, and reactive to light. EOM are normal.  Neck: Neck supple. No JVD present. No tracheal deviation present.  Cardiovascular: Regular rhythm and normal heart sounds. Tachycardia present.  Pulmonary/Chest: Effort normal and breath sounds normal. He has no wheezes.  Abdominal: Soft. He exhibits no distension.  Musculoskeletal: Normal range of motion. He exhibits no edema.  Lymphadenopathy:    He has no cervical adenopathy.  Neurological: He is alert and oriented to person, place, and time.  No overt focal deficits noted  Skin: Skin is warm and dry.  Psychiatric: He has a normal mood and affect. His behavior is normal.  Nursing note and vitals reviewed.         Assessment & Plan:    1) COPD: He appears well compensated in this regard.  He is having rare breakthrough symptoms and he is in favor of getting a rescue inhaler which he was declining previously.  He has been prescribed albuterol MDI to use as rescue up to 4 times a day.  He was taught the proper use of the inhaler.  We will see him in follow-up in 3 months time, he is to contact us prior to that time should any new difficulties arise.  2) Non  small cell carcinoma of the right lung with sub carinal involvement: this issue adds complexity to his management and it is being managed by Dr. Janese Banks at the cancer center.  Patient has had Port-A-Cath placed and is getting chemotherapy.  3) Cerebellar Strokes, sub centimeter: Patient has been asymptomatic in this regard.  He had negative cardiovascular work-up.  This may be related to hypercoagulable state associated with  cancer.  Continue aspirin therapy.  Continue chemotherapy.  4) Tobacco use disorder, in early remission: the patient was commended on his discontinuation of smoking.  He continues to abstain.

## 2018-05-08 ENCOUNTER — Other Ambulatory Visit: Payer: Self-pay | Admitting: *Deleted

## 2018-05-08 DIAGNOSIS — C342 Malignant neoplasm of middle lobe, bronchus or lung: Secondary | ICD-10-CM

## 2018-05-09 ENCOUNTER — Ambulatory Visit
Admission: RE | Admit: 2018-05-09 | Discharge: 2018-05-09 | Disposition: A | Discharge status: Home / Self Care | Payer: Medicare PPO | Source: Ambulatory Visit | Attending: Radiation Oncology | Admitting: Radiation Oncology

## 2018-05-09 ENCOUNTER — Ambulatory Visit: Payer: Medicare PPO

## 2018-05-09 DIAGNOSIS — C342 Malignant neoplasm of middle lobe, bronchus or lung: Secondary | ICD-10-CM | POA: Diagnosis not present

## 2018-05-10 ENCOUNTER — Ambulatory Visit: Payer: Medicare PPO

## 2018-05-10 ENCOUNTER — Other Ambulatory Visit: Payer: Self-pay

## 2018-05-10 ENCOUNTER — Inpatient Hospital Stay (HOSPITAL_BASED_OUTPATIENT_CLINIC_OR_DEPARTMENT_OTHER): Payer: Medicare PPO | Admitting: Oncology

## 2018-05-10 ENCOUNTER — Encounter: Payer: Self-pay | Admitting: Oncology

## 2018-05-10 ENCOUNTER — Inpatient Hospital Stay: Payer: Medicare PPO | Attending: Oncology

## 2018-05-10 ENCOUNTER — Ambulatory Visit
Admission: RE | Admit: 2018-05-10 | Discharge: 2018-05-10 | Disposition: A | Discharge status: Home / Self Care | Payer: Medicare PPO | Source: Ambulatory Visit | Attending: Radiation Oncology | Admitting: Radiation Oncology

## 2018-05-10 ENCOUNTER — Inpatient Hospital Stay: Payer: Medicare PPO

## 2018-05-10 VITALS — BP 147/84 | HR 103 | Temp 96.7°F | Resp 18 | Wt 159.7 lb

## 2018-05-10 DIAGNOSIS — C342 Malignant neoplasm of middle lobe, bronchus or lung: Secondary | ICD-10-CM | POA: Insufficient documentation

## 2018-05-10 DIAGNOSIS — Z79899 Other long term (current) drug therapy: Secondary | ICD-10-CM | POA: Diagnosis not present

## 2018-05-10 DIAGNOSIS — R5381 Other malaise: Secondary | ICD-10-CM | POA: Diagnosis not present

## 2018-05-10 DIAGNOSIS — E876 Hypokalemia: Secondary | ICD-10-CM | POA: Diagnosis not present

## 2018-05-10 DIAGNOSIS — Z8673 Personal history of transient ischemic attack (TIA), and cerebral infarction without residual deficits: Secondary | ICD-10-CM | POA: Insufficient documentation

## 2018-05-10 DIAGNOSIS — D701 Agranulocytosis secondary to cancer chemotherapy: Secondary | ICD-10-CM | POA: Diagnosis not present

## 2018-05-10 DIAGNOSIS — D6481 Anemia due to antineoplastic chemotherapy: Secondary | ICD-10-CM | POA: Insufficient documentation

## 2018-05-10 DIAGNOSIS — R5383 Other fatigue: Secondary | ICD-10-CM | POA: Diagnosis not present

## 2018-05-10 DIAGNOSIS — Z87891 Personal history of nicotine dependence: Secondary | ICD-10-CM | POA: Diagnosis not present

## 2018-05-10 DIAGNOSIS — I10 Essential (primary) hypertension: Secondary | ICD-10-CM | POA: Diagnosis not present

## 2018-05-10 DIAGNOSIS — T451X5S Adverse effect of antineoplastic and immunosuppressive drugs, sequela: Secondary | ICD-10-CM | POA: Diagnosis not present

## 2018-05-10 DIAGNOSIS — Z7982 Long term (current) use of aspirin: Secondary | ICD-10-CM

## 2018-05-10 DIAGNOSIS — Z5111 Encounter for antineoplastic chemotherapy: Secondary | ICD-10-CM

## 2018-05-10 DIAGNOSIS — I471 Supraventricular tachycardia: Secondary | ICD-10-CM | POA: Diagnosis not present

## 2018-05-10 DIAGNOSIS — T451X5A Adverse effect of antineoplastic and immunosuppressive drugs, initial encounter: Secondary | ICD-10-CM | POA: Insufficient documentation

## 2018-05-10 LAB — CBC WITH DIFFERENTIAL/PLATELET
Abs Immature Granulocytes: 0.02 10*3/uL (ref 0.00–0.07)
BASOS PCT: 0 %
Basophils Absolute: 0 10*3/uL (ref 0.0–0.1)
Eosinophils Absolute: 0 10*3/uL (ref 0.0–0.5)
Eosinophils Relative: 2 %
HCT: 24.7 % — ABNORMAL LOW (ref 39.0–52.0)
Hemoglobin: 8.1 g/dL — ABNORMAL LOW (ref 13.0–17.0)
Immature Granulocytes: 1 %
Lymphocytes Relative: 20 %
Lymphs Abs: 0.5 10*3/uL — ABNORMAL LOW (ref 0.7–4.0)
MCH: 31.4 pg (ref 26.0–34.0)
MCHC: 32.8 g/dL (ref 30.0–36.0)
MCV: 95.7 fL (ref 80.0–100.0)
Monocytes Absolute: 0.3 10*3/uL (ref 0.1–1.0)
Monocytes Relative: 12 %
Neutro Abs: 1.7 10*3/uL (ref 1.7–7.7)
Neutrophils Relative %: 65 %
PLATELETS: 152 10*3/uL (ref 150–400)
RBC: 2.58 MIL/uL — ABNORMAL LOW (ref 4.22–5.81)
RDW: 14.6 % (ref 11.5–15.5)
WBC: 2.5 10*3/uL — ABNORMAL LOW (ref 4.0–10.5)
nRBC: 0 % (ref 0.0–0.2)

## 2018-05-10 LAB — COMPREHENSIVE METABOLIC PANEL
ALT: 21 U/L (ref 0–44)
AST: 16 U/L (ref 15–41)
Albumin: 2.8 g/dL — ABNORMAL LOW (ref 3.5–5.0)
Alkaline Phosphatase: 64 U/L (ref 38–126)
Anion gap: 9 (ref 5–15)
BUN: 6 mg/dL — ABNORMAL LOW (ref 8–23)
CO2: 25 mmol/L (ref 22–32)
Calcium: 8.4 mg/dL — ABNORMAL LOW (ref 8.9–10.3)
Chloride: 104 mmol/L (ref 98–111)
Creatinine, Ser: 0.62 mg/dL (ref 0.61–1.24)
GFR calc Af Amer: >60 mL/min (ref >60)
Glucose, Bld: 135 mg/dL — ABNORMAL HIGH (ref 70–99)
Potassium: 3.2 mmol/L — ABNORMAL LOW (ref 3.5–5.1)
Sodium: 138 mmol/L (ref 135–145)
Total Bilirubin: 0.6 mg/dL (ref 0.3–1.2)
Total Protein: 6.2 g/dL — ABNORMAL LOW (ref 6.5–8.1)

## 2018-05-10 MED ORDER — SODIUM CHLORIDE 0.9 % IV SOLN
45.0000 mg/m2 | Freq: Once | INTRAVENOUS | Status: AC
  Administered 2018-05-10: 84 mg via INTRAVENOUS
  Filled 2018-05-10: qty 14

## 2018-05-10 MED ORDER — SODIUM CHLORIDE 0.9 % IV SOLN
Freq: Once | INTRAVENOUS | Status: AC
  Administered 2018-05-10: 11:00:00 via INTRAVENOUS
  Filled 2018-05-10: qty 250

## 2018-05-10 MED ORDER — SODIUM CHLORIDE 0.9% FLUSH
10.0000 mL | INTRAVENOUS | Status: DC | PRN
  Administered 2018-05-10: 10 mL via INTRAVENOUS
  Filled 2018-05-10: qty 10

## 2018-05-10 MED ORDER — PALONOSETRON HCL INJECTION 0.25 MG/5ML
0.2500 mg | Freq: Once | INTRAVENOUS | Status: AC
  Administered 2018-05-10: 0.25 mg via INTRAVENOUS
  Filled 2018-05-10: qty 5

## 2018-05-10 MED ORDER — DIPHENHYDRAMINE HCL 50 MG/ML IJ SOLN
50.0000 mg | Freq: Once | INTRAMUSCULAR | Status: AC
  Administered 2018-05-10: 50 mg via INTRAVENOUS
  Filled 2018-05-10: qty 1

## 2018-05-10 MED ORDER — FAMOTIDINE IN NACL 20-0.9 MG/50ML-% IV SOLN
20.0000 mg | Freq: Once | INTRAVENOUS | Status: AC
  Administered 2018-05-10: 20 mg via INTRAVENOUS
  Filled 2018-05-10: qty 50

## 2018-05-10 MED ORDER — SODIUM CHLORIDE 0.9 % IV SOLN
180.6000 mg | Freq: Once | INTRAVENOUS | Status: AC
  Administered 2018-05-10: 180 mg via INTRAVENOUS
  Filled 2018-05-10: qty 18

## 2018-05-10 MED ORDER — HEPARIN SOD (PORK) LOCK FLUSH 100 UNIT/ML IV SOLN
500.0000 [IU] | Freq: Once | INTRAVENOUS | Status: AC
  Administered 2018-05-10: 500 [IU] via INTRAVENOUS
  Filled 2018-05-10: qty 5

## 2018-05-10 MED ORDER — SODIUM CHLORIDE 0.9 % IV SOLN
20.0000 mg | Freq: Once | INTRAVENOUS | Status: AC
  Administered 2018-05-10: 20 mg via INTRAVENOUS
  Filled 2018-05-10: qty 2

## 2018-05-10 NOTE — Progress Notes (Addendum)
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 He underwent lung cancer screening low-dose CT scan which showed a right middle lobe lung mass of about 4.8 cm. Subcarinal adenopathy 2.8 x 4 cm and probable right hilar adenopathy and soft tissue fullness 2.3 cm. This was followed by a PET CT scan which showed right middle lobe mass was hypermetabolic at 94.5 with hypermetabolic right hilar and infrahilar adenopathy. Hypermetabolic 2.5 cm subcarinal node and right paratracheal nodes. No hypermetabolic contralateral adenopathy. Left upper lobe 4 mm solitary pulmonary nodule below PET resolution. No evidence of hypermetabolic them elsewhere.  Biopsy of the right middle lobe showed non-small cell carcinoma favor adenocarcinoma. 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 However it showed acute cerebellar stroke for which patient was admitted to the hospital and underwent a work-up including carotid Dopplers as well as CTA of the head and neck and echo with bubble study which were unremarkable  Interval history-he reports doing well.  Denies any nausea vomiting pain or difficulty swallowing.  His appetite is fair and his weight has remained stable.  ECOG PS- 2 Pain scale-  0 Opioid associated constipation- no  Review of systems- Review of Systems  Constitutional: Positive for malaise/fatigue. Negative for chills, fever and weight loss.  HENT: Negative for congestion, ear discharge and nosebleeds.   Eyes: Negative for blurred vision.  Respiratory: Negative for cough, hemoptysis, sputum production, shortness of breath and wheezing.   Cardiovascular: Negative for chest pain, palpitations, orthopnea and claudication.  Gastrointestinal: Negative for abdominal pain, blood in stool, constipation, diarrhea, heartburn, melena, nausea and vomiting.  Genitourinary: Negative for dysuria, flank pain, frequency, hematuria and urgency.  Musculoskeletal: Negative for back pain, joint pain and myalgias.  Skin: Negative for rash.  Neurological: Negative for dizziness, tingling, focal weakness, seizures, weakness and headaches.  Endo/Heme/Allergies: Does not bruise/bleed easily.  Psychiatric/Behavioral: Negative for depression and suicidal ideas. The patient does not have insomnia.       Allergies  Allergen Reactions  . Aspirin Itching     Past Medical History:  Diagnosis Date  . Hypertension   . Lung cancer [facility])      Past Surgical History:  Procedure Laterality Date  . CYST EXCISION Left    buttock  . ENDOBRONCHIAL ULTRASOUND Right 03/29/2018   Procedure: ENDOBRONCHIAL ULTRASOUND;  Surgeon: Tyler Pita, MD;  Location: [facility] ORS;  Service: Cardiopulmonary;  Laterality: Right;  . FLEXIBLE BRONCHOSCOPY Right 03/29/2018   Procedure: FLEXIBLE BRONCHOSCOPY;  Surgeon: Tyler Pita, MD;  Location: [facility] ORS;  Service: Cardiopulmonary;  Laterality: Right;  . PORTA CATH INSERTION N/A 04/13/2018   Procedure: PORTA CATH INSERTION;  Surgeon: Algernon Huxley, MD;  Location: [place] [facility];  Service: Cardiovascular;  Laterality: N/A;    Social History   Socioeconomic History  . Marital status: Married  Spouse name: Not on file  . Number of children: 4    . Years of education: Not on file  . Highest education level: Not on file  Occupational History  . Occupation: retired    Comment: mill/construction   Social Needs  . Financial resource strain: Not hard at all  . Food insecurity:    Worry: Never true    Inability: Never true  . Transportation needs:    Medical: No    Non-medical: No  Tobacco Use  . Smoking status: Former Smoker    Packs/day: 1.00    Years: 58.00    Pack years: 58.00    Types: Cigarettes    Last attempt to quit: 03/30/2018    Years since quitting: 0.1  . Smokeless tobacco: Never Used  Substance and Sexual Activity  . Alcohol use: Not Currently    Comment: daily 32 oz beer  . Drug use: Not Currently  . Sexual activity: Not on file  Lifestyle  . Physical activity:    Days per week: 0 days    Minutes per session: 0 min  . Stress: Only a little  Relationships  . Social connections:    Talks on phone: More than three times a week    Gets together: Twice a week    Attends religious service: More than 4 times per year    Active member of club or organization: Not on file    Attends meetings of clubs or organizations: Not on file    Relationship status: Married  . Intimate partner violence:    Fear of current or ex partner: No    Emotionally abused: No    Physically abused: No    Forced sexual activity: No  Other Topics Concern  . Not on file  Social History Narrative  . Not on file    Family History  Problem Relation Age of Onset  . Diabetes Mother   . CAD Mother   . Stroke Father      Current Outpatient Medications:  .  albuterol (PROVENTIL HFA;VENTOLIN HFA) 108 (90 Base) MCG/ACT inhaler, Inhale 2 puffs into the lungs every 6 (six) hours as needed for wheezing or shortness of breath., Disp: 1 Inhaler, Rfl: 2 .  aspirin EC 81 MG EC tablet, Take 1 tablet (81 mg total) by mouth daily., Disp: 30 tablet, Rfl: 1 .  atorvastatin (LIPITOR) 40 MG tablet, Take 1 tablet (40 mg total) by mouth daily at 6  PM., Disp: 30 tablet, Rfl: 1 .  budesonide-formoterol (SYMBICORT) 160-4.5 MCG/ACT inhaler, Inhale 2 puffs into the lungs 2 (two) times daily., Disp: , Rfl:  .  Cyanocobalamin (VITAMIN B-12 PO), Take 1 tablet by mouth daily., Disp: , Rfl:  .  dexamethasone (DECADRON) 4 MG tablet, Take 2 tablets (8 mg total) by mouth daily. Start the day after chemotherapy for 2 days., Disp: 30 tablet, Rfl: 1 .  losartan (COZAAR) 25 MG tablet, Take 25 mg by mouth daily. , Disp: , Rfl:  .  Multiple Vitamins-Minerals (MULTIVITAMIN PO), Take 1 tablet by mouth daily., Disp: , Rfl:  .  Pyridoxine HCl (VITAMIN B-6 PO), Take 1 tablet by mouth daily., Disp: , Rfl:  .  Spacer/Aero-Holding Chambers DEVI, 1 each by Does not apply route daily., Disp: 1 each, Rfl: 0 .  LORazepam (ATIVAN) 0.5 MG tablet, Take 1 tablet (0.5 mg total) by mouth every 6 (six) hours as needed (Nausea or vomiting). (Patient not taking: Reported on 05/10/2018), Disp: 30 tablet, Rfl: 0 .  ondansetron (ZOFRAN) 8 MG tablet, Take 1 tablet (8 mg total) by mouth 2 (two) times daily as needed for refractory nausea / vomiting. Start on day 3 after chemo. (Patient not taking: Reported on 05/10/2018), Disp: 30 tablet, Rfl: 1 .  prochlorperazine (COMPAZINE) 10 MG tablet, Take 1 tablet (10 mg total) by mouth every 6 (six) hours as needed (Nausea or vomiting). (Patient not taking: Reported on 05/10/2018), Disp: 30 tablet, Rfl: 1 No current facility-administered medications for this visit.   Facility-Administered Medications Ordered in Other Visits:  .  CARBOplatin (PARAPLATIN) 180 mg in sodium chloride 0.9 % 250 mL chemo infusion, 180 mg, Intravenous, Once, Sindy Guadeloupe, MD .  heparin lock flush 100 unit/mL, 500 Units, Intravenous, Once, Sindy Guadeloupe, MD .  PACLitaxel (TAXOL) 84 mg in sodium chloride 0.9 % 250 mL chemo infusion (</= 72m/m2), 45 mg/m2 (Treatment Plan Recorded), Intravenous, Once, RSindy Guadeloupe MD, Last Rate: 264 mL/hr at 05/10/18 1245, 84 mg at  05/10/18 1245 .  sodium chloride flush (NS) 0.9 % injection 10 mL, 10 mL, Intracatheter, PRN, RSindy Guadeloupe MD .  sodium chloride flush (NS) 0.9 % injection 10 mL, 10 mL, Intravenous, PRN, RSindy Guadeloupe MD, 10 mL at 05/10/18 0925  Physical exam:  Vitals:   05/10/18 1029  BP: (!) 147/84  Pulse: (!) 103  Resp: 18  Temp: (!) 96.7 F (35.9 C)  TempSrc: Tympanic  Weight: 159 lb 11.2 oz (72.4 kg)   Physical Exam  Constitutional: He is oriented to person, place, and time.  Thin elderly gentleman in no acute distress  HENT:  Head: Normocephalic and atraumatic.  Eyes: Pupils are equal, round, and reactive to light. EOM are normal.  Neck: Normal range of motion.  Cardiovascular: Normal rate, regular rhythm and normal heart sounds.  Pulmonary/Chest: Effort normal and breath sounds normal.  Abdominal: Soft. Bowel sounds are normal.  Neurological: He is alert and oriented to person, place, and time.  Skin: Skin is warm and dry.     CMP Latest Ref Rng & Units 05/10/2018  Glucose 70 - 99 mg/dL 135(H)  BUN 8 - 23 mg/dL 6(L)  Creatinine 0.61 - 1.24 mg/dL 0.62  Sodium 135 - 145 mmol/L 138  Potassium 3.5 - 5.1 mmol/L 3.2(L)  Chloride 98 - 111 mmol/L 104  CO2 22 - 32 mmol/L 25  Calcium 8.9 - 10.3 mg/dL 8.4(L)  Total Protein 6.5 - 8.1 g/dL 6.2(L)  Total Bilirubin 0.3 - 1.2 mg/dL 0.6  Alkaline Phos 38 - 126 U/L 64  AST 15 - 41 U/L 16  ALT 0 - 44 U/L 21   CBC Latest Ref Rng & Units 05/10/2018  WBC 4.0 - 10.5 K/uL 2.5(L)  Hemoglobin 13.0 - 17.0 g/dL 8.1(L)  Hematocrit 39.0 - 52.0 % 24.7(L)  Platelets 150 - 400 K/uL 152     Assessment and plan- Patient is a 72y.o. male withadenocarcinoma of the right middle lobe of the lung stage IIIBcT2 N3M0.    He is here for on treatment assessment prior to cycle 3 of weekly carbotaxol chemotherapy  Patient has mild leukopenia with a white count of 2.5 with an ANC of 1.7.  He will proceed with cycle 3 of weekly carbotaxol chemotherapy today.   Since patient is getting weekly chemotherapy along with radiation and I am unable to give him Neulasta for his neutropenia.  However without growth factor support I suspect that his ADixiewill not be greater than 1 to proceed with chemotherapy  next week. I would therefore like to give him Neupogen tomorrow and day after if his insurance approves it.  1-2 doses of Neupogen will enable him to get through his chemo along with radiation.    I will see him back in 1 week's time with CBC and CMP for cycle 4 of weekly carbotaxol chemotherapy if his counts permit.    I spoke with Dr. Donella Stade who plans to give him split dose radiation initially for up.  Of 4 weeks ending on 05/23/2018 followed by a short break of 1 to 2 weeks and he will receive 3 more weekly cycles of chemotherapy thereafter.  Since his radiation will be ending on 05/23/2018 for now, I will give him a break from chemotherapy after next week as well to allow his counts to recover.  Chemo induced anemia: His hemoglobin is down to 8.1 today.  I will check reticulocyte count iron studies B12 and folate with the next set of labs   Visit Diagnosis 1. Encounter for antineoplastic chemotherapy   2. Chemotherapy induced neutropenia (HCC)   3. Malignant neoplasm of middle lobe of right lung ([place])   4. Antineoplastic chemotherapy induced anemia      Dr. Randa Evens, MD, MPH [facility] at [facility] [phone] 05/10/2018 1:03 PM

## 2018-05-10 NOTE — Progress Notes (Signed)
Here for follow up. Overall per pt" feeling fine" no pain c/o.

## 2018-05-11 ENCOUNTER — Ambulatory Visit: Payer: Medicare PPO

## 2018-05-11 ENCOUNTER — Ambulatory Visit
Admission: RE | Admit: 2018-05-11 | Discharge: 2018-05-11 | Disposition: A | Discharge status: Home / Self Care | Payer: Medicare PPO | Source: Ambulatory Visit | Attending: Radiation Oncology | Admitting: Radiation Oncology

## 2018-05-11 DIAGNOSIS — C342 Malignant neoplasm of middle lobe, bronchus or lung: Secondary | ICD-10-CM | POA: Diagnosis not present

## 2018-05-12 ENCOUNTER — Ambulatory Visit: Payer: Medicare PPO

## 2018-05-12 ENCOUNTER — Ambulatory Visit
Admission: RE | Admit: 2018-05-12 | Discharge: 2018-05-12 | Disposition: A | Discharge status: Home / Self Care | Payer: Medicare PPO | Source: Ambulatory Visit | Attending: Radiation Oncology | Admitting: Radiation Oncology

## 2018-05-12 DIAGNOSIS — C342 Malignant neoplasm of middle lobe, bronchus or lung: Secondary | ICD-10-CM | POA: Diagnosis not present

## 2018-05-13 ENCOUNTER — Ambulatory Visit: Payer: Medicare PPO

## 2018-05-13 ENCOUNTER — Ambulatory Visit
Admission: RE | Admit: 2018-05-13 | Discharge: 2018-05-13 | Disposition: A | Discharge status: Home / Self Care | Payer: Medicare PPO | Source: Ambulatory Visit | Attending: Radiation Oncology | Admitting: Radiation Oncology

## 2018-05-13 DIAGNOSIS — C342 Malignant neoplasm of middle lobe, bronchus or lung: Secondary | ICD-10-CM | POA: Diagnosis not present

## 2018-05-16 ENCOUNTER — Ambulatory Visit
Admission: RE | Admit: 2018-05-16 | Discharge: 2018-05-16 | Disposition: A | Discharge status: Home / Self Care | Payer: Medicare PPO | Source: Ambulatory Visit | Attending: Radiation Oncology | Admitting: Radiation Oncology

## 2018-05-16 ENCOUNTER — Ambulatory Visit: Payer: Medicare PPO

## 2018-05-16 DIAGNOSIS — C342 Malignant neoplasm of middle lobe, bronchus or lung: Secondary | ICD-10-CM | POA: Diagnosis not present

## 2018-05-17 ENCOUNTER — Inpatient Hospital Stay (HOSPITAL_BASED_OUTPATIENT_CLINIC_OR_DEPARTMENT_OTHER): Payer: Medicare PPO | Admitting: Oncology

## 2018-05-17 ENCOUNTER — Other Ambulatory Visit: Payer: Self-pay | Admitting: Oncology

## 2018-05-17 ENCOUNTER — Ambulatory Visit
Admission: RE | Admit: 2018-05-17 | Discharge: 2018-05-17 | Disposition: A | Discharge status: Home / Self Care | Payer: Medicare PPO | Source: Ambulatory Visit | Attending: Radiation Oncology | Admitting: Radiation Oncology

## 2018-05-17 ENCOUNTER — Inpatient Hospital Stay: Payer: Medicare PPO

## 2018-05-17 ENCOUNTER — Ambulatory Visit: Payer: Medicare PPO

## 2018-05-17 ENCOUNTER — Encounter: Payer: Self-pay | Admitting: Oncology

## 2018-05-17 VITALS — BP 144/72 | HR 108 | Temp 97.9°F | Resp 18 | Ht 69.0 in | Wt 159.2 lb

## 2018-05-17 DIAGNOSIS — Z87891 Personal history of nicotine dependence: Secondary | ICD-10-CM

## 2018-05-17 DIAGNOSIS — Z8673 Personal history of transient ischemic attack (TIA), and cerebral infarction without residual deficits: Secondary | ICD-10-CM

## 2018-05-17 DIAGNOSIS — I10 Essential (primary) hypertension: Secondary | ICD-10-CM

## 2018-05-17 DIAGNOSIS — R5383 Other fatigue: Secondary | ICD-10-CM

## 2018-05-17 DIAGNOSIS — D6481 Anemia due to antineoplastic chemotherapy: Secondary | ICD-10-CM

## 2018-05-17 DIAGNOSIS — D701 Agranulocytosis secondary to cancer chemotherapy: Secondary | ICD-10-CM

## 2018-05-17 DIAGNOSIS — C342 Malignant neoplasm of middle lobe, bronchus or lung: Secondary | ICD-10-CM

## 2018-05-17 DIAGNOSIS — T451X5S Adverse effect of antineoplastic and immunosuppressive drugs, sequela: Secondary | ICD-10-CM

## 2018-05-17 DIAGNOSIS — Z7189 Other specified counseling: Secondary | ICD-10-CM | POA: Insufficient documentation

## 2018-05-17 DIAGNOSIS — Z7982 Long term (current) use of aspirin: Secondary | ICD-10-CM

## 2018-05-17 DIAGNOSIS — T451X5A Adverse effect of antineoplastic and immunosuppressive drugs, initial encounter: Secondary | ICD-10-CM

## 2018-05-17 DIAGNOSIS — Z5111 Encounter for antineoplastic chemotherapy: Secondary | ICD-10-CM

## 2018-05-17 DIAGNOSIS — R5381 Other malaise: Secondary | ICD-10-CM

## 2018-05-17 DIAGNOSIS — E876 Hypokalemia: Secondary | ICD-10-CM | POA: Diagnosis not present

## 2018-05-17 DIAGNOSIS — Z79899 Other long term (current) drug therapy: Secondary | ICD-10-CM

## 2018-05-17 LAB — CBC WITH DIFFERENTIAL/PLATELET
Abs Immature Granulocytes: 0.02 10*3/uL (ref 0.00–0.07)
Basophils Absolute: 0 10*3/uL (ref 0.0–0.1)
Basophils Relative: 0 %
Eosinophils Absolute: 0 10*3/uL (ref 0.0–0.5)
Eosinophils Relative: 1 %
HCT: 24.7 % — ABNORMAL LOW (ref 39.0–52.0)
Hemoglobin: 8.1 g/dL — ABNORMAL LOW (ref 13.0–17.0)
Immature Granulocytes: 1 %
Lymphocytes Relative: 31 %
Lymphs Abs: 0.8 10*3/uL (ref 0.7–4.0)
MCH: 31.3 pg (ref 26.0–34.0)
MCHC: 32.8 g/dL (ref 30.0–36.0)
MCV: 95.4 fL (ref 80.0–100.0)
MONOS PCT: 10 %
Monocytes Absolute: 0.3 10*3/uL (ref 0.1–1.0)
Neutro Abs: 1.4 10*3/uL — ABNORMAL LOW (ref 1.7–7.7)
Neutrophils Relative %: 57 %
Platelets: 140 10*3/uL — ABNORMAL LOW (ref 150–400)
RBC: 2.59 MIL/uL — ABNORMAL LOW (ref 4.22–5.81)
RDW: 15.8 % — ABNORMAL HIGH (ref 11.5–15.5)
WBC: 2.5 10*3/uL — ABNORMAL LOW (ref 4.0–10.5)
nRBC: 0 % (ref 0.0–0.2)

## 2018-05-17 LAB — RETICULOCYTES
Immature Retic Fract: 25.3 % — ABNORMAL HIGH (ref 2.3–15.9)
RBC.: 2.59 MIL/uL — ABNORMAL LOW (ref 4.22–5.81)
Retic Count, Absolute: 55.7 10*3/uL (ref 19.0–186.0)
Retic Ct Pct: 2.2 % (ref 0.4–3.1)

## 2018-05-17 LAB — COMPREHENSIVE METABOLIC PANEL
ALBUMIN: 2.6 g/dL — AB (ref 3.5–5.0)
ALT: 19 U/L (ref 0–44)
AST: 19 U/L (ref 15–41)
Alkaline Phosphatase: 68 U/L (ref 38–126)
Anion gap: 5 (ref 5–15)
BUN: 6 mg/dL — ABNORMAL LOW (ref 8–23)
CO2: 28 mmol/L (ref 22–32)
CREATININE: 0.62 mg/dL (ref 0.61–1.24)
Calcium: 8.1 mg/dL — ABNORMAL LOW (ref 8.9–10.3)
Chloride: 100 mmol/L (ref 98–111)
GFR calc Af Amer: >60 mL/min (ref >60)
GFR calc non Af Amer: >60 mL/min (ref >60)
Glucose, Bld: 115 mg/dL — ABNORMAL HIGH (ref 70–99)
Potassium: 2.9 mmol/L — ABNORMAL LOW (ref 3.5–5.1)
Sodium: 133 mmol/L — ABNORMAL LOW (ref 135–145)
Total Bilirubin: 0.6 mg/dL (ref 0.3–1.2)
Total Protein: 5.9 g/dL — ABNORMAL LOW (ref 6.5–8.1)

## 2018-05-17 LAB — FERRITIN: Ferritin: 290 ng/mL (ref 24–336)

## 2018-05-17 LAB — IRON AND TIBC
Iron: 47 ug/dL (ref 45–182)
SATURATION RATIOS: 25 % (ref 17.9–39.5)
TIBC: 187 ug/dL — AB (ref 250–450)
UIBC: 140 ug/dL

## 2018-05-17 LAB — VITAMIN B12: Vitamin B-12: 478 pg/mL (ref 180–914)

## 2018-05-17 LAB — FOLATE: Folate: 8.1 ng/mL (ref >5.9)

## 2018-05-17 MED ORDER — SODIUM CHLORIDE 0.9 % IV SOLN
180.6000 mg | Freq: Once | INTRAVENOUS | Status: AC
  Administered 2018-05-17: 180 mg via INTRAVENOUS
  Filled 2018-05-17: qty 18

## 2018-05-17 MED ORDER — FAMOTIDINE IN NACL 20-0.9 MG/50ML-% IV SOLN
20.0000 mg | Freq: Once | INTRAVENOUS | Status: AC
  Administered 2018-05-17: 20 mg via INTRAVENOUS
  Filled 2018-05-17: qty 50

## 2018-05-17 MED ORDER — SODIUM CHLORIDE 0.9 % IV SOLN
Freq: Once | INTRAVENOUS | Status: AC
  Administered 2018-05-17: 12:00:00 via INTRAVENOUS
  Filled 2018-05-17: qty 500

## 2018-05-17 MED ORDER — SODIUM CHLORIDE 0.9 % IV SOLN
45.0000 mg/m2 | Freq: Once | INTRAVENOUS | Status: AC
  Administered 2018-05-17: 84 mg via INTRAVENOUS
  Filled 2018-05-17: qty 14

## 2018-05-17 MED ORDER — SODIUM CHLORIDE 0.9 % IV SOLN
Freq: Once | INTRAVENOUS | Status: AC
  Administered 2018-05-17: 12:00:00 via INTRAVENOUS
  Filled 2018-05-17: qty 250

## 2018-05-17 MED ORDER — DIPHENHYDRAMINE HCL 50 MG/ML IJ SOLN
50.0000 mg | Freq: Once | INTRAMUSCULAR | Status: AC
  Administered 2018-05-17: 50 mg via INTRAVENOUS
  Filled 2018-05-17: qty 1

## 2018-05-17 MED ORDER — SODIUM CHLORIDE 0.9 % IV SOLN
20.0000 mg | Freq: Once | INTRAVENOUS | Status: AC
  Administered 2018-05-17: 20 mg via INTRAVENOUS
  Filled 2018-05-17: qty 2

## 2018-05-17 MED ORDER — SODIUM CHLORIDE 0.9% FLUSH
10.0000 mL | Freq: Once | INTRAVENOUS | Status: AC
  Administered 2018-05-17: 10 mL via INTRAVENOUS
  Filled 2018-05-17: qty 10

## 2018-05-17 MED ORDER — HEPARIN SOD (PORK) LOCK FLUSH 100 UNIT/ML IV SOLN
500.0000 [IU] | Freq: Once | INTRAVENOUS | Status: AC | PRN
  Administered 2018-05-17: 500 [IU]
  Filled 2018-05-17: qty 5

## 2018-05-17 MED ORDER — PALONOSETRON HCL INJECTION 0.25 MG/5ML
0.2500 mg | Freq: Once | INTRAVENOUS | Status: AC
  Administered 2018-05-17: 0.25 mg via INTRAVENOUS
  Filled 2018-05-17: qty 5

## 2018-05-17 NOTE — Progress Notes (Signed)
No new changes noted today 

## 2018-05-17 NOTE — Progress Notes (Signed)
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 He underwent lung cancer screening low-dose CT scan which showed a right middle lobe lung mass of about 4.8 cm. Subcarinal adenopathy 2.8 x 4 cm and probable right hilar adenopathy and soft tissue fullness 2.3 cm. This was followed by a PET CT scan which showed right middle lobe mass was hypermetabolic at 90.3 with hypermetabolic right hilar and infrahilar adenopathy. Hypermetabolic 2.5 cm subcarinal node and right paratracheal nodes. No hypermetabolic contralateral adenopathy. Left upper lobe 4 mm solitary pulmonary nodule below PET resolution. No evidence of hypermetabolic them elsewhere.  Biopsy of the right middle lobe showed non-small cell carcinoma favor adenocarcinoma. 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 However it showed acute cerebellar stroke for which patient was admitted to the hospital and underwent a work-up including carotid Dopplers as well as CTA of the head and neck and echo with bubble study which were unremarkable   Interval history-he is tolerating chemoradiation well so far.  Reports no nausea vomiting.  He does have some mild fatigue which is ongoing and unchanged.  Denies any shortness of  breath cough or fever.  ECOG PS- 2 Pain scale- 0 Opioid associated constipation- no  Review of systems- Review of Systems  Constitutional: Positive for malaise/fatigue. Negative for chills, fever and weight loss.  HENT: Negative for congestion, ear discharge and nosebleeds.   Eyes: Negative for blurred vision.  Respiratory: Negative for cough, hemoptysis, sputum production, shortness of breath and wheezing.   Cardiovascular: Negative for chest pain, palpitations, orthopnea and claudication.  Gastrointestinal: Negative for abdominal pain, blood in stool, constipation, diarrhea, heartburn, melena, nausea and vomiting.  Genitourinary: Negative for dysuria, flank pain, frequency, hematuria and urgency.  Musculoskeletal: Negative for back pain, joint pain and myalgias.  Skin: Negative for rash.  Neurological: Negative for dizziness, tingling, focal weakness, seizures, weakness and headaches.  Endo/Heme/Allergies: Does not bruise/bleed easily.  Psychiatric/Behavioral: Negative for depression and suicidal ideas. The patient does not have insomnia.        Allergies  Allergen Reactions  . Aspirin Itching     Past Medical History:  Diagnosis Date  . Hypertension   . Lung cancer [facility])      Past Surgical History:  Procedure Laterality Date  . CYST EXCISION Left    buttock  . ENDOBRONCHIAL ULTRASOUND Right 03/29/2018   Procedure: ENDOBRONCHIAL ULTRASOUND;  Surgeon: Tyler Pita, MD;  Location: [facility] ORS;  Service: Cardiopulmonary;  Laterality: Right;  . FLEXIBLE BRONCHOSCOPY Right 03/29/2018   Procedure: FLEXIBLE BRONCHOSCOPY;  Surgeon: Tyler Pita, MD;  Location: [facility] ORS;  Service: Cardiopulmonary;  Laterality: Right;  . PORTA CATH INSERTION N/A 04/13/2018   Procedure: PORTA CATH INSERTION;  Surgeon: Algernon Huxley, MD;  Location: [place] CV LAB;  Service: Cardiovascular;  Laterality: N/A;    Social History  Socioeconomic History  . Marital status: Married     Spouse name: Not on file  . Number of children: 4  . Years of education: Not on file  . Highest education level: Not on file  Occupational History  . Occupation: retired    Comment: mill/construction   Social Needs  . Financial resource strain: Not hard at all  . Food insecurity:    Worry: Never true    Inability: Never true  . Transportation needs:    Medical: No    Non-medical: No  Tobacco Use  . Smoking status: Former Smoker    Packs/day: 1.00    Years: 58.00    Pack years: 58.00    Types: Cigarettes    Last attempt to quit: 03/30/2018    Years since quitting: 0.1  . Smokeless tobacco: Never Used  Substance and Sexual Activity  . Alcohol use: Not Currently    Comment: daily 32 oz beer  . Drug use: Not Currently  . Sexual activity: Not on file  Lifestyle  . Physical activity:    Days per week: 0 days    Minutes per session: 0 min  . Stress: Only a little  Relationships  . Social connections:    Talks on phone: More than three times a week    Gets together: Twice a week    Attends religious service: More than 4 times per year    Active member of club or organization: Not on file    Attends meetings of clubs or organizations: Not on file    Relationship status: Married  . Intimate partner violence:    Fear of current or ex partner: No    Emotionally abused: No    Physically abused: No    Forced sexual activity: No  Other Topics Concern  . Not on file  Social History Narrative  . Not on file    Family History  Problem Relation Age of Onset  . Diabetes Mother   . CAD Mother   . Stroke Father      Current Outpatient Medications:  .  aspirin EC 81 MG EC tablet, Take 1 tablet (81 mg total) by mouth daily., Disp: 30 tablet, Rfl: 1 .  atorvastatin (LIPITOR) 40 MG tablet, Take 1 tablet (40 mg total) by mouth daily at 6 PM., Disp: 30 tablet, Rfl: 1 .  budesonide-formoterol (SYMBICORT) 160-4.5 MCG/ACT inhaler, Inhale 2 puffs into the lungs 2 (two) times daily.,  Disp: , Rfl:  .  Cyanocobalamin (VITAMIN B-12 PO), Take 1 tablet by mouth daily., Disp: , Rfl:  .  losartan (COZAAR) 25 MG tablet, Take 25 mg by mouth daily. , Disp: , Rfl:  .  Multiple Vitamins-Minerals (MULTIVITAMIN PO), Take 1 tablet by mouth daily., Disp: , Rfl:  .  Pyridoxine HCl (VITAMIN B-6 PO), Take 1 tablet by mouth daily., Disp: , Rfl:  .  albuterol (PROVENTIL HFA;VENTOLIN HFA) 108 (90 Base) MCG/ACT inhaler, Inhale 2 puffs into the lungs every 6 (six) hours as needed for wheezing or shortness of breath., Disp: 1 Inhaler, Rfl: 2 .  dexamethasone (DECADRON) 4 MG tablet, Take 2 tablets (8 mg total) by mouth daily. Start the day after chemotherapy for 2 days. (Patient not taking: Reported on 05/17/2018), Disp: 30 tablet, Rfl: 1 .  LORazepam (ATIVAN) 0.5 MG tablet, Take 1 tablet (0.5 mg total) by mouth every 6 (six) hours as needed (Nausea or vomiting). (Patient not taking: Reported on 05/10/2018), Disp: 30 tablet, Rfl: 0 .  ondansetron (  ZOFRAN) 8 MG tablet, Take 1 tablet (8 mg total) by mouth 2 (two) times daily as needed for refractory nausea / vomiting. Start on day 3 after chemo. (Patient not taking: Reported on 05/10/2018), Disp: 30 tablet, Rfl: 1 .  prochlorperazine (COMPAZINE) 10 MG tablet, Take 1 tablet (10 mg total) by mouth every 6 (six) hours as needed (Nausea or vomiting). (Patient not taking: Reported on 05/10/2018), Disp: 30 tablet, Rfl: 1 .  Spacer/Aero-Holding Chambers DEVI, 1 each by Does not apply route daily. (Patient not taking: Reported on 05/17/2018), Disp: 1 each, Rfl: 0 No current facility-administered medications for this visit.   Facility-Administered Medications Ordered in Other Visits:  .  sodium chloride flush (NS) 0.9 % injection 10 mL, 10 mL, Intracatheter, PRN, Sindy Guadeloupe, MD  Physical exam:  Vitals:   05/17/18 1107  BP: (!) 144/72  Pulse: (!) 108  Resp: 18  Temp: 97.9 F (36.6 C)  TempSrc: Tympanic  SpO2: 97%  Weight: 159 lb 3.2 oz (72.2 kg)  Height:  _0  (1.753 m)   Physical Exam  Constitutional: He is oriented to person, place, and time.  Thin elderly gentleman in no acute distress  HENT:  Head: Normocephalic and atraumatic.  He is edentulous  Eyes: Pupils are equal, round, and reactive to light. EOM are normal.  Neck: Normal range of motion.  Cardiovascular: Normal rate, regular rhythm and normal heart sounds.  Pulmonary/Chest: Effort normal and breath sounds normal.  Abdominal: Soft. Bowel sounds are normal.  Neurological: He is alert and oriented to person, place, and time.  Skin: Skin is warm and dry.     CMP Latest Ref Rng & Units 05/17/2018  Glucose 70 - 99 mg/dL 115(H)  BUN 8 - 23 mg/dL 6(L)  Creatinine 0.61 - 1.24 mg/dL 0.62  Sodium 135 - 145 mmol/L 133(L)  Potassium 3.5 - 5.1 mmol/L 2.9(L)  Chloride 98 - 111 mmol/L 100  CO2 22 - 32 mmol/L 28  Calcium 8.9 - 10.3 mg/dL 8.1(L)  Total Protein 6.5 - 8.1 g/dL 5.9(L)  Total Bilirubin 0.3 - 1.2 mg/dL 0.6  Alkaline Phos 38 - 126 U/L 68  AST 15 - 41 U/L 19  ALT 0 - 44 U/L 19   CBC Latest Ref Rng & Units 05/17/2018  WBC 4.0 - 10.5 K/uL 2.5(L)  Hemoglobin 13.0 - 17.0 g/dL 8.1(L)  Hematocrit 39.0 - 52.0 % 24.7(L)  Platelets 150 - 400 K/uL 140(L)     Assessment and plan- Patient is a 72 y.o. male withadenocarcinoma of the right middle lobe of the lung stage IIIBcT2 N3M0.   He is here for on treatment assessment prior to cycle 4 of weekly carbotaxol chemotherapy.  Patient's white count is 2.5 today with an ANC of 1.4.  Neupogen was not approved by his insurance.  I will give him cycle 4 of chemotherapy today since he will not be getting any chemotherapy for the next 2 to 3 weeks.  He will finish his first phase of radiation on 05/23/2018 and depending on when radiation oncology plans to restart his radiation I will plan to restart his chemotherapy at that time.   Patient is hypokalemic today with a potassium of 2.9.  He will give 40 mEq of IV potassium today.  We  will repeat his BMP tomorrow and depending on his potassium levels will likely give him 40 mEq of more IV potassium tomorrow  His further follow-up with me will be depending on radiation oncology and their plans to  restart radiation   Visit Diagnosis 1. Encounter for antineoplastic chemotherapy   2. Chemotherapy induced neutropenia (HCC)   3. Malignant neoplasm of middle lobe of right lung ([place])   4. Hypokalemia      Dr. Randa Evens, MD, MPH [facility] at [facility] [phone] 05/17/2018 3:57 PM

## 2018-05-17 NOTE — Progress Notes (Signed)
ANC 1.4 ok to treat per MD

## 2018-05-18 ENCOUNTER — Ambulatory Visit
Admission: RE | Admit: 2018-05-18 | Discharge: 2018-05-18 | Disposition: A | Discharge status: Home / Self Care | Payer: Medicare PPO | Source: Ambulatory Visit | Attending: Radiation Oncology | Admitting: Radiation Oncology

## 2018-05-18 ENCOUNTER — Inpatient Hospital Stay: Payer: Medicare PPO

## 2018-05-18 ENCOUNTER — Other Ambulatory Visit: Payer: Self-pay | Admitting: *Deleted

## 2018-05-18 ENCOUNTER — Ambulatory Visit: Payer: Medicare PPO

## 2018-05-18 DIAGNOSIS — Z5111 Encounter for antineoplastic chemotherapy: Secondary | ICD-10-CM | POA: Diagnosis not present

## 2018-05-18 DIAGNOSIS — C342 Malignant neoplasm of middle lobe, bronchus or lung: Secondary | ICD-10-CM | POA: Diagnosis not present

## 2018-05-18 DIAGNOSIS — E876 Hypokalemia: Secondary | ICD-10-CM

## 2018-05-18 LAB — BASIC METABOLIC PANEL
Anion gap: 6 (ref 5–15)
BUN: 11 mg/dL (ref 8–23)
CO2: 25 mmol/L (ref 22–32)
Calcium: 8.1 mg/dL — ABNORMAL LOW (ref 8.9–10.3)
Chloride: 105 mmol/L (ref 98–111)
Creatinine, Ser: 0.73 mg/dL (ref 0.61–1.24)
GFR calc Af Amer: >60 mL/min (ref >60)
GFR calc non Af Amer: >60 mL/min (ref >60)
GLUCOSE: 169 mg/dL — AB (ref 70–99)
POTASSIUM: 3.7 mmol/L (ref 3.5–5.1)
Sodium: 136 mmol/L (ref 135–145)

## 2018-05-18 MED ORDER — HEPARIN SOD (PORK) LOCK FLUSH 100 UNIT/ML IV SOLN
500.0000 [IU] | Freq: Once | INTRAVENOUS | Status: AC
  Administered 2018-05-18: 500 [IU] via INTRAVENOUS
  Filled 2018-05-18: qty 5

## 2018-05-18 MED ORDER — SODIUM CHLORIDE 0.9% FLUSH
10.0000 mL | Freq: Once | INTRAVENOUS | Status: AC
  Administered 2018-05-18: 10 mL via INTRAVENOUS
  Filled 2018-05-18: qty 10

## 2018-05-19 ENCOUNTER — Ambulatory Visit: Payer: Medicare PPO

## 2018-05-19 ENCOUNTER — Ambulatory Visit
Admission: RE | Admit: 2018-05-19 | Discharge: 2018-05-19 | Disposition: A | Discharge status: Home / Self Care | Payer: Medicare PPO | Source: Ambulatory Visit | Attending: Radiation Oncology | Admitting: Radiation Oncology

## 2018-05-19 DIAGNOSIS — C342 Malignant neoplasm of middle lobe, bronchus or lung: Secondary | ICD-10-CM | POA: Diagnosis not present

## 2018-05-20 ENCOUNTER — Ambulatory Visit
Admission: RE | Admit: 2018-05-20 | Discharge: 2018-05-20 | Disposition: A | Discharge status: Home / Self Care | Payer: Medicare PPO | Source: Ambulatory Visit | Attending: Radiation Oncology | Admitting: Radiation Oncology

## 2018-05-20 ENCOUNTER — Ambulatory Visit: Payer: Medicare PPO

## 2018-05-20 DIAGNOSIS — C342 Malignant neoplasm of middle lobe, bronchus or lung: Secondary | ICD-10-CM | POA: Diagnosis not present

## 2018-05-23 ENCOUNTER — Ambulatory Visit: Payer: Medicare PPO

## 2018-05-23 ENCOUNTER — Ambulatory Visit
Admission: RE | Admit: 2018-05-23 | Discharge: 2018-05-23 | Disposition: A | Discharge status: Home / Self Care | Payer: Medicare PPO | Source: Ambulatory Visit | Attending: Radiation Oncology | Admitting: Radiation Oncology

## 2018-05-23 DIAGNOSIS — C342 Malignant neoplasm of middle lobe, bronchus or lung: Secondary | ICD-10-CM | POA: Diagnosis not present

## 2018-05-24 ENCOUNTER — Ambulatory Visit: Payer: Medicare PPO

## 2018-05-25 ENCOUNTER — Ambulatory Visit: Payer: Medicare PPO

## 2018-05-26 ENCOUNTER — Ambulatory Visit: Payer: Medicare PPO

## 2018-05-27 ENCOUNTER — Ambulatory Visit: Payer: Medicare PPO

## 2018-05-30 ENCOUNTER — Other Ambulatory Visit: Payer: Self-pay

## 2018-05-30 ENCOUNTER — Ambulatory Visit: Payer: Medicare PPO

## 2018-05-30 ENCOUNTER — Ambulatory Visit
Admission: RE | Admit: 2018-05-30 | Discharge: 2018-05-30 | Disposition: A | Discharge status: Home / Self Care | Payer: Medicare PPO | Source: Ambulatory Visit | Attending: Radiation Oncology | Admitting: Radiation Oncology

## 2018-05-30 ENCOUNTER — Encounter: Payer: Self-pay | Admitting: Radiation Oncology

## 2018-05-30 VITALS — BP 101/64 | HR 56 | Temp 97.7°F | Resp 16 | Wt 153.0 lb

## 2018-05-30 DIAGNOSIS — C342 Malignant neoplasm of middle lobe, bronchus or lung: Secondary | ICD-10-CM | POA: Diagnosis not present

## 2018-05-30 DIAGNOSIS — Z923 Personal history of irradiation: Secondary | ICD-10-CM | POA: Diagnosis not present

## 2018-05-30 DIAGNOSIS — R5383 Other fatigue: Secondary | ICD-10-CM | POA: Diagnosis not present

## 2018-05-30 NOTE — Progress Notes (Signed)
Radiation Oncology Follow up Note  Name: Philip Weiss   Date:   05/30/2018 MRN:  [id number] DOB: 1946-02-14    This 72 y.o. male presents to the clinic today for 1 week follow-up after initial large field radiation therapy with concurrent chemotherapy for stage IIIc adenocarcinoma the right lung.  REFERRING PROVIDER: Center, [facility]*  HPI: patient is a 72 year old male on short treatment break status post large field radiation therapy with concurrent chemotherapy for stage IIIc (T2 N3 M0) adenocarcinoma the right lung seen today in routine follow-up he is doing well. He somewhat fatigued. He's having no significant dysphagia cough hemoptysis or chest tightness..  COMPLICATIONS OF TREATMENT: none  FOLLOW UP COMPLIANCE: keeps appointments   PHYSICAL EXAM:  BP 101/64 (BP Location: Left Arm, Patient Position: Sitting)   Pulse (!) 56   Temp 97.7 F (36.5 C) (Tympanic)   Resp 16   Wt 153 lb (69.4 kg)   BMI 22.59 kg/m  Well-developed well-nourished patient in NAD. HEENT reveals PERLA, EOMI, discs not visualized.  Oral cavity is clear. No oral mucosal lesions are identified. Neck is clear without evidence of cervical or supraclavicular adenopathy. Lungs are clear to A&P. Cardiac examination is essentially unremarkable with regular rate and rhythm without murmur rub or thrill. Abdomen is benign with no organomegaly or masses noted. Motor sensory and DTR levels are equal and symmetric in the upper and lower extremities. Cranial nerves II through XII are grossly intact. Proprioception is intact. No peripheral adenopathy or edema is identified. No motor or sensory levels are noted. Crude visual fields are within normal range.  RADIOLOGY RESULTS: no current films for review  PLAN: this time I to go ahead with CT scan for small field lung boost will evaluate his treatment response. Possibly would add another 3000 cGy over 3 weeks with possible concurrent chemotherapy under medical oncology's  direction. Once I have symptom later this week will discuss that with medical oncology. Patient knows to call with any concerns. I personally 7 ordered CT scintillation for later this week.  I would like to take this opportunity to thank you for allowing me to participate in the care of your patient.Noreene Filbert, MD

## 2018-05-31 ENCOUNTER — Ambulatory Visit: Payer: Medicare PPO

## 2018-06-02 ENCOUNTER — Ambulatory Visit: Payer: Medicare PPO

## 2018-06-02 ENCOUNTER — Ambulatory Visit
Admission: RE | Admit: 2018-06-02 | Discharge: 2018-06-02 | Disposition: A | Discharge status: Home / Self Care | Payer: Medicare PPO | Source: Ambulatory Visit | Attending: Radiation Oncology | Admitting: Radiation Oncology

## 2018-06-02 DIAGNOSIS — C342 Malignant neoplasm of middle lobe, bronchus or lung: Secondary | ICD-10-CM | POA: Diagnosis not present

## 2018-06-03 ENCOUNTER — Ambulatory Visit: Payer: Medicare PPO

## 2018-06-03 DIAGNOSIS — C342 Malignant neoplasm of middle lobe, bronchus or lung: Secondary | ICD-10-CM | POA: Diagnosis not present

## 2018-06-06 ENCOUNTER — Telehealth: Payer: Self-pay | Admitting: *Deleted

## 2018-06-06 ENCOUNTER — Ambulatory Visit: Payer: Medicare PPO

## 2018-06-06 ENCOUNTER — Other Ambulatory Visit: Payer: Self-pay | Admitting: *Deleted

## 2018-06-06 DIAGNOSIS — I959 Hypotension, unspecified: Secondary | ICD-10-CM

## 2018-06-06 DIAGNOSIS — C342 Malignant neoplasm of middle lobe, bronchus or lung: Secondary | ICD-10-CM

## 2018-06-06 NOTE — Telephone Encounter (Signed)
Wife called to report that patient B/P is low and is asking if he needs to come in to be seen. There is no [facility] clinic today, please advise

## 2018-06-06 NOTE — Telephone Encounter (Signed)
Did encourage wife to get patient to drink plenty of fluids-8-9 cups of something today and she said she will try. He has drank OJ, ensure and other liq. Just not much to eat

## 2018-06-06 NOTE — Telephone Encounter (Signed)
Called wife and she states that he felt weak an dizzy this am and checked his b/p and it was 81/53. I told her that b/p is low. Asked if he took losartan and she said that she told him she was holding the med today.  I offered her to bring pt. To [place] because the only Np for symptom management is here and she does not know how to get to [facility], [place], she does not feel comfortable coming to [place] and asked if they can come tom. I checked with lauren and she said it is ok. Will put him on for 8:30 for labs and 8:45 to see Lauren and poss. Fluids with sx management. Wife agreeable. Labs: cbc/d, met c

## 2018-06-07 ENCOUNTER — Inpatient Hospital Stay
Admission: EM | Admit: 2018-06-07 | Discharge: 2018-06-10 | DRG: 193 | Disposition: A | Discharge status: Home / Self Care | Payer: Medicare PPO | Attending: Internal Medicine | Admitting: Internal Medicine

## 2018-06-07 ENCOUNTER — Emergency Department: Payer: Medicare PPO

## 2018-06-07 ENCOUNTER — Other Ambulatory Visit: Payer: Self-pay

## 2018-06-07 ENCOUNTER — Inpatient Hospital Stay (HOSPITAL_BASED_OUTPATIENT_CLINIC_OR_DEPARTMENT_OTHER): Payer: Medicare PPO | Admitting: Nurse Practitioner

## 2018-06-07 ENCOUNTER — Observation Stay: Payer: Medicare PPO

## 2018-06-07 ENCOUNTER — Encounter: Payer: Self-pay | Admitting: Emergency Medicine

## 2018-06-07 ENCOUNTER — Inpatient Hospital Stay: Payer: Medicare PPO

## 2018-06-07 ENCOUNTER — Ambulatory Visit: Payer: Medicare PPO

## 2018-06-07 DIAGNOSIS — Z66 Do not resuscitate: Secondary | ICD-10-CM | POA: Diagnosis present

## 2018-06-07 DIAGNOSIS — D638 Anemia in other chronic diseases classified elsewhere: Secondary | ICD-10-CM | POA: Diagnosis present

## 2018-06-07 DIAGNOSIS — R531 Weakness: Secondary | ICD-10-CM | POA: Diagnosis not present

## 2018-06-07 DIAGNOSIS — Z87891 Personal history of nicotine dependence: Secondary | ICD-10-CM

## 2018-06-07 DIAGNOSIS — Z85118 Personal history of other malignant neoplasm of bronchus and lung: Secondary | ICD-10-CM | POA: Diagnosis not present

## 2018-06-07 DIAGNOSIS — Z923 Personal history of irradiation: Secondary | ICD-10-CM

## 2018-06-07 DIAGNOSIS — I471 Supraventricular tachycardia, unspecified: Secondary | ICD-10-CM | POA: Insufficient documentation

## 2018-06-07 DIAGNOSIS — Z79899 Other long term (current) drug therapy: Secondary | ICD-10-CM | POA: Diagnosis not present

## 2018-06-07 DIAGNOSIS — R5381 Other malaise: Secondary | ICD-10-CM

## 2018-06-07 DIAGNOSIS — I248 Other forms of acute ischemic heart disease: Secondary | ICD-10-CM | POA: Diagnosis present

## 2018-06-07 DIAGNOSIS — D649 Anemia, unspecified: Secondary | ICD-10-CM | POA: Diagnosis not present

## 2018-06-07 DIAGNOSIS — E785 Hyperlipidemia, unspecified: Secondary | ICD-10-CM | POA: Diagnosis present

## 2018-06-07 DIAGNOSIS — I10 Essential (primary) hypertension: Secondary | ICD-10-CM | POA: Diagnosis present

## 2018-06-07 DIAGNOSIS — E871 Hypo-osmolality and hyponatremia: Secondary | ICD-10-CM

## 2018-06-07 DIAGNOSIS — C3490 Malignant neoplasm of unspecified part of unspecified bronchus or lung: Secondary | ICD-10-CM | POA: Diagnosis not present

## 2018-06-07 DIAGNOSIS — R5383 Other fatigue: Secondary | ICD-10-CM

## 2018-06-07 DIAGNOSIS — J189 Pneumonia, unspecified organism: Secondary | ICD-10-CM | POA: Diagnosis present

## 2018-06-07 DIAGNOSIS — F17211 Nicotine dependence, cigarettes, in remission: Secondary | ICD-10-CM | POA: Diagnosis not present

## 2018-06-07 DIAGNOSIS — Z8673 Personal history of transient ischemic attack (TIA), and cerebral infarction without residual deficits: Secondary | ICD-10-CM

## 2018-06-07 DIAGNOSIS — J181 Lobar pneumonia, unspecified organism: Secondary | ICD-10-CM | POA: Diagnosis not present

## 2018-06-07 DIAGNOSIS — Z7982 Long term (current) use of aspirin: Secondary | ICD-10-CM | POA: Diagnosis not present

## 2018-06-07 DIAGNOSIS — C342 Malignant neoplasm of middle lobe, bronchus or lung: Secondary | ICD-10-CM | POA: Diagnosis not present

## 2018-06-07 DIAGNOSIS — J9601 Acute respiratory failure with hypoxia: Secondary | ICD-10-CM | POA: Diagnosis present

## 2018-06-07 DIAGNOSIS — I959 Hypotension, unspecified: Secondary | ICD-10-CM

## 2018-06-07 HISTORY — DX: Cerebral infarction, unspecified: I63.9

## 2018-06-07 LAB — CBC WITH DIFFERENTIAL/PLATELET
Abs Immature Granulocytes: 0.02 10*3/uL (ref 0.00–0.07)
BASOS PCT: 1 %
Basophils Absolute: 0 10*3/uL (ref 0.0–0.1)
Eosinophils Absolute: 0.1 10*3/uL (ref 0.0–0.5)
Eosinophils Relative: 1 %
HCT: 27.5 % — ABNORMAL LOW (ref 39.0–52.0)
Hemoglobin: 8.9 g/dL — ABNORMAL LOW (ref 13.0–17.0)
Immature Granulocytes: 1 %
Lymphocytes Relative: 19 %
Lymphs Abs: 0.8 10*3/uL (ref 0.7–4.0)
MCH: 30.4 pg (ref 26.0–34.0)
MCHC: 32.4 g/dL (ref 30.0–36.0)
MCV: 93.9 fL (ref 80.0–100.0)
Monocytes Absolute: 0.6 10*3/uL (ref 0.1–1.0)
Monocytes Relative: 13 %
Neutro Abs: 2.8 10*3/uL (ref 1.7–7.7)
Neutrophils Relative %: 65 %
PLATELETS: 170 10*3/uL (ref 150–400)
RBC: 2.93 MIL/uL — ABNORMAL LOW (ref 4.22–5.81)
RDW: 17.9 % — ABNORMAL HIGH (ref 11.5–15.5)
WBC: 4.2 10*3/uL (ref 4.0–10.5)
nRBC: 0 % (ref 0.0–0.2)

## 2018-06-07 LAB — COMPREHENSIVE METABOLIC PANEL
ALT: 50 U/L — ABNORMAL HIGH (ref 0–44)
AST: 51 U/L — ABNORMAL HIGH (ref 15–41)
Albumin: 2.4 g/dL — ABNORMAL LOW (ref 3.5–5.0)
Alkaline Phosphatase: 93 U/L (ref 38–126)
Anion gap: 10 (ref 5–15)
BUN: 15 mg/dL (ref 8–23)
CO2: 23 mmol/L (ref 22–32)
Calcium: 8.6 mg/dL — ABNORMAL LOW (ref 8.9–10.3)
Chloride: 95 mmol/L — ABNORMAL LOW (ref 98–111)
Creatinine, Ser: 0.94 mg/dL (ref 0.61–1.24)
GFR calc Af Amer: >60 mL/min (ref >60)
GFR calc non Af Amer: >60 mL/min (ref >60)
Glucose, Bld: 136 mg/dL — ABNORMAL HIGH (ref 70–99)
Potassium: 4.4 mmol/L (ref 3.5–5.1)
Sodium: 128 mmol/L — ABNORMAL LOW (ref 135–145)
Total Bilirubin: 0.7 mg/dL (ref 0.3–1.2)
Total Protein: 6.8 g/dL (ref 6.5–8.1)

## 2018-06-07 LAB — URINALYSIS, COMPLETE (UACMP) WITH MICROSCOPIC
BACTERIA UA: NONE SEEN
Bilirubin Urine: NEGATIVE
Glucose, UA: NEGATIVE mg/dL
Hgb urine dipstick: NEGATIVE
Ketones, ur: NEGATIVE mg/dL
Leukocytes, UA: NEGATIVE
Nitrite: NEGATIVE
PROTEIN: NEGATIVE mg/dL
Specific Gravity, Urine: 1.006 (ref 1.005–1.030)
Squamous Epithelial / HPF: NONE SEEN (ref 0–5)
pH: 7 (ref 5.0–8.0)

## 2018-06-07 LAB — INFLUENZA PANEL BY PCR (TYPE A & B)
Influenza A By PCR: NEGATIVE
Influenza B By PCR: NEGATIVE

## 2018-06-07 LAB — SODIUM, URINE, RANDOM: SODIUM UR: 56 mmol/L

## 2018-06-07 LAB — TROPONIN I
Troponin I: 0.06 ng/mL (ref <0.03)
Troponin I: 0.05 ng/mL (ref <0.03)

## 2018-06-07 LAB — ABO/RH: ABO/RH(D): B POS

## 2018-06-07 LAB — OSMOLALITY, URINE: Osmolality, Ur: 271 mOsm/kg — ABNORMAL LOW (ref 300–900)

## 2018-06-07 MED ORDER — BUDESONIDE 0.5 MG/2ML IN SUSP
0.5000 mg | Freq: Two times a day (BID) | RESPIRATORY_TRACT | Status: DC
  Administered 2018-06-07 – 2018-06-10 (×6): 0.5 mg via RESPIRATORY_TRACT
  Filled 2018-06-07 (×6): qty 2

## 2018-06-07 MED ORDER — ADENOSINE 6 MG/2ML IV SOLN
6.0000 mg | Freq: Once | INTRAVENOUS | Status: AC
  Administered 2018-06-07: 6 mg via INTRAVENOUS

## 2018-06-07 MED ORDER — SODIUM CHLORIDE 0.9 % IV SOLN
INTRAVENOUS | Status: DC
  Administered 2018-06-07 – 2018-06-08 (×2): via INTRAVENOUS

## 2018-06-07 MED ORDER — ONDANSETRON HCL 4 MG PO TABS: 4.0000 mg | ORAL_TABLET | Freq: Four times a day (QID) | ORAL | Status: DC | PRN

## 2018-06-07 MED ORDER — SODIUM CHLORIDE 0.9 % IV BOLUS
1000.0000 mL | Freq: Once | INTRAVENOUS | Status: AC
  Administered 2018-06-07: 1000 mL via INTRAVENOUS

## 2018-06-07 MED ORDER — SODIUM CHLORIDE 0.9 % IV SOLN
INTRAVENOUS | Status: DC
  Administered 2018-06-07: 14:00:00 via INTRAVENOUS

## 2018-06-07 MED ORDER — VITAMIN B-6 50 MG PO TABS
25.0000 mg | ORAL_TABLET | Freq: Every day | ORAL | Status: DC
  Administered 2018-06-08 – 2018-06-10 (×3): 25 mg via ORAL
  Filled 2018-06-07 (×3): qty 0.5

## 2018-06-07 MED ORDER — DILTIAZEM HCL ER COATED BEADS 180 MG PO CP24
180.0000 mg | ORAL_CAPSULE | Freq: Every day | ORAL | Status: DC
  Administered 2018-06-07 – 2018-06-10 (×4): 180 mg via ORAL
  Filled 2018-06-07 (×4): qty 1

## 2018-06-07 MED ORDER — AZITHROMYCIN 250 MG PO TABS
250.0000 mg | ORAL_TABLET | Freq: Every day | ORAL | Status: DC
  Administered 2018-06-08 – 2018-06-10 (×3): 250 mg via ORAL
  Filled 2018-06-07 (×3): qty 1

## 2018-06-07 MED ORDER — VITAMIN B-12 100 MCG PO TABS
100.0000 ug | ORAL_TABLET | Freq: Every day | ORAL | Status: DC
  Administered 2018-06-08 – 2018-06-10 (×3): 100 ug via ORAL
  Filled 2018-06-07 (×3): qty 1

## 2018-06-07 MED ORDER — MAGNESIUM SULFATE 2 GM/50ML IV SOLN
2.0000 g | Freq: Once | INTRAVENOUS | Status: AC
  Administered 2018-06-07: 2 g via INTRAVENOUS
  Filled 2018-06-07: qty 50

## 2018-06-07 MED ORDER — ADENOSINE 6 MG/2ML IV SOLN
INTRAVENOUS | Status: AC
  Filled 2018-06-07: qty 2

## 2018-06-07 MED ORDER — ONDANSETRON HCL 4 MG/2ML IJ SOLN: 4.0000 mg | Freq: Four times a day (QID) | INTRAMUSCULAR | Status: DC | PRN

## 2018-06-07 MED ORDER — DILTIAZEM HCL 60 MG PO TABS
30.0000 mg | ORAL_TABLET | Freq: Once | ORAL | Status: AC
  Administered 2018-06-07: 30 mg via ORAL

## 2018-06-07 MED ORDER — METHYLPREDNISOLONE SODIUM SUCC 40 MG IJ SOLR
40.0000 mg | Freq: Two times a day (BID) | INTRAMUSCULAR | Status: DC
  Administered 2018-06-07 – 2018-06-09 (×4): 40 mg via INTRAVENOUS
  Filled 2018-06-07 (×4): qty 1

## 2018-06-07 MED ORDER — ASPIRIN EC 81 MG PO TBEC
81.0000 mg | DELAYED_RELEASE_TABLET | Freq: Every day | ORAL | Status: DC
  Administered 2018-06-08 – 2018-06-10 (×3): 81 mg via ORAL
  Filled 2018-06-07 (×3): qty 1

## 2018-06-07 MED ORDER — DILTIAZEM HCL 25 MG/5ML IV SOLN
5.0000 mg | Freq: Once | INTRAVENOUS | Status: AC
  Administered 2018-06-07: 5 mg via INTRAVENOUS

## 2018-06-07 MED ORDER — ACETAMINOPHEN 650 MG RE SUPP: 650.0000 mg | Freq: Four times a day (QID) | RECTAL | Status: DC | PRN

## 2018-06-07 MED ORDER — SODIUM CHLORIDE 0.9 % IV SOLN
1.0000 g | INTRAVENOUS | Status: DC
  Administered 2018-06-07 – 2018-06-09 (×3): 1 g via INTRAVENOUS
  Filled 2018-06-07: qty 1
  Filled 2018-06-07: qty 10
  Filled 2018-06-07 (×2): qty 1
  Filled 2018-06-07: qty 10

## 2018-06-07 MED ORDER — ADENOSINE 6 MG/2ML IV SOLN
INTRAVENOUS | Status: AC
  Administered 2018-06-07: 6 mg via INTRAVENOUS
  Filled 2018-06-07: qty 2

## 2018-06-07 MED ORDER — ENOXAPARIN SODIUM 40 MG/0.4ML ~~LOC~~ SOLN
40.0000 mg | SUBCUTANEOUS | Status: DC
  Administered 2018-06-07 – 2018-06-10 (×4): 40 mg via SUBCUTANEOUS
  Filled 2018-06-07 (×4): qty 0.4

## 2018-06-07 MED ORDER — METOPROLOL TARTRATE 5 MG/5ML IV SOLN
5.0000 mg | INTRAVENOUS | Status: DC | PRN
  Administered 2018-06-07: 5 mg via INTRAVENOUS
  Filled 2018-06-07: qty 5

## 2018-06-07 MED ORDER — DILTIAZEM HCL-DEXTROSE 100-5 MG/100ML-% IV SOLN (PREMIX)
5.0000 mg/h | INTRAVENOUS | Status: DC
  Administered 2018-06-07: 5 mg/h via INTRAVENOUS
  Administered 2018-06-07: 7.5 mg/h via INTRAVENOUS
  Administered 2018-06-08: 5 mg/h via INTRAVENOUS
  Filled 2018-06-07: qty 100

## 2018-06-07 MED ORDER — ACETAMINOPHEN 325 MG PO TABS
650.0000 mg | ORAL_TABLET | Freq: Four times a day (QID) | ORAL | Status: DC | PRN
  Administered 2018-06-08: 650 mg via ORAL
  Filled 2018-06-07 (×2): qty 2

## 2018-06-07 MED ORDER — ORAL CARE MOUTH RINSE
15.0000 mL | Freq: Two times a day (BID) | OROMUCOSAL | Status: DC
  Administered 2018-06-07 – 2018-06-09 (×4): 15 mL via OROMUCOSAL

## 2018-06-07 MED ORDER — MOMETASONE FURO-FORMOTEROL FUM 200-5 MCG/ACT IN AERO
2.0000 | INHALATION_SPRAY | Freq: Two times a day (BID) | RESPIRATORY_TRACT | Status: DC
  Administered 2018-06-07 – 2018-06-10 (×6): 2 via RESPIRATORY_TRACT
  Filled 2018-06-07: qty 8.8

## 2018-06-07 MED ORDER — AZITHROMYCIN 250 MG PO TABS
500.0000 mg | ORAL_TABLET | Freq: Every day | ORAL | Status: AC
  Administered 2018-06-07: 500 mg via ORAL
  Filled 2018-06-07: qty 2

## 2018-06-07 MED ORDER — IPRATROPIUM-ALBUTEROL 0.5-2.5 (3) MG/3ML IN SOLN
RESPIRATORY_TRACT | Status: AC
  Administered 2018-06-07: 3 mL
  Filled 2018-06-07: qty 3

## 2018-06-07 MED ORDER — ATORVASTATIN CALCIUM 20 MG PO TABS
40.0000 mg | ORAL_TABLET | Freq: Every day | ORAL | Status: DC
  Administered 2018-06-07 – 2018-06-09 (×3): 40 mg via ORAL
  Filled 2018-06-07 (×3): qty 2

## 2018-06-07 MED ORDER — DILTIAZEM HCL 25 MG/5ML IV SOLN
INTRAVENOUS | Status: AC
  Administered 2018-06-07: 5 mg via INTRAVENOUS
  Filled 2018-06-07: qty 5

## 2018-06-07 MED ORDER — IPRATROPIUM-ALBUTEROL 0.5-2.5 (3) MG/3ML IN SOLN
3.0000 mL | Freq: Four times a day (QID) | RESPIRATORY_TRACT | Status: DC
  Administered 2018-06-07: 3 mL via RESPIRATORY_TRACT
  Filled 2018-06-07: qty 3

## 2018-06-07 MED ORDER — IOHEXOL 350 MG/ML SOLN
75.0000 mL | Freq: Once | INTRAVENOUS | Status: AC | PRN
  Administered 2018-06-07: 75 mL via INTRAVENOUS

## 2018-06-07 NOTE — Progress Notes (Signed)
Patient ID: Philip Weiss, male   DOB: 1946-03-22, 72 y.o.   MRN: [id number]  Patient has pneumonia on CT scan of chest.  antibiotics ordered earlier.  With acute hypoxic respiratory failure patient was changed to inpatient status.  Dr. Loletha Grayer

## 2018-06-07 NOTE — ED Provider Notes (Signed)
[facility] Emergency Department Provider Note ____________________________________________   First MD Initiated Contact with Patient 06/07/18 [phone]     (approximate)  I have reviewed the triage vital signs and the nursing notes.   HISTORY  Chief Complaint Tachycardia    HPI Philip Weiss is a 72 y.o. male with PMH as noted below including history of lung cancer actively on chemotherapy and radiation who presents from the cancer center with tachycardia.  His heart rate was noted to be in the 160s to 170s.  The patient states that he has been feeling dizzy (which she describes as lightheaded) over the last few days, gradual onset, and associated with generalized weakness.  He denies any chest pain but states he is somewhat short of breath.  His most recent chemotherapy was about 2 weeks ago.  He has no prior history of SVT.  Past Medical History:  Diagnosis Date  . Hypertension   . Lung cancer [facility])     Patient Active Problem List   Diagnosis Date Noted  . SVT (supraventricular tachycardia) ([place]) 06/07/2018  . Goals of care, counseling/discussion 05/17/2018  . Chemotherapy induced neutropenia ([place]) 05/10/2018  . CVA (cerebral vascular accident) ([place]) 04/07/2018  . Lung cancer ([place]) 04/05/2018  . Mass of middle lobe of right lung 03/29/2018  . Mediastinal mass     Past Surgical History:  Procedure Laterality Date  . CYST EXCISION Left    buttock  . ENDOBRONCHIAL ULTRASOUND Right 03/29/2018   Procedure: ENDOBRONCHIAL ULTRASOUND;  Surgeon: Tyler Pita, MD;  Location: [facility] ORS;  Service: Cardiopulmonary;  Laterality: Right;  . FLEXIBLE BRONCHOSCOPY Right 03/29/2018   Procedure: FLEXIBLE BRONCHOSCOPY;  Surgeon: Tyler Pita, MD;  Location: [facility] ORS;  Service: Cardiopulmonary;  Laterality: Right;  . PORTA CATH INSERTION N/A 04/13/2018   Procedure: PORTA CATH INSERTION;  Surgeon: Algernon Huxley, MD;  Location: [facility] CV LAB;  Service:  Cardiovascular;  Laterality: N/A;    Prior to Admission medications   Medication Sig Start Date End Date Taking? Authorizing Provider  aspirin EC 81 MG EC tablet Take 1 tablet (81 mg total) by mouth daily. 04/09/18  Yes Fritzi Mandes, MD  atorvastatin (LIPITOR) 40 MG tablet Take 1 tablet (40 mg total) by mouth daily at 6 PM. 04/08/18  Yes Fritzi Mandes, MD  budesonide-formoterol [facility]) 160-4.5 MCG/ACT inhaler Inhale 2 puffs into the lungs 2 (two) times daily.   Yes [provider]  Cyanocobalamin (VITAMIN B-12 PO) Take 1 tablet by mouth daily.   Yes [provider]  Multiple Vitamins-Minerals (MULTIVITAMIN PO) Take 1 tablet by mouth daily.   Yes [provider]  Pyridoxine HCl (VITAMIN B-6 PO) Take 1 tablet by mouth daily.   Yes [provider]  albuterol (PROVENTIL HFA;VENTOLIN HFA) 108 (90 Base) MCG/ACT inhaler Inhale 2 puffs into the lungs every 6 (six) hours as needed for wheezing or shortness of breath. Patient not taking: Reported on 06/07/2018 05/04/18   Tyler Pita, MD  dexamethasone (DECADRON) 4 MG tablet Take 2 tablets (8 mg total) by mouth daily. Start the day after chemotherapy for 2 days. Patient not taking: Reported on 06/07/2018 04/05/18   Sindy Guadeloupe, MD  LORazepam (ATIVAN) 0.5 MG tablet Take 1 tablet (0.5 mg total) by mouth every 6 (six) hours as needed (Nausea or vomiting). Patient not taking: Reported on 05/10/2018 04/05/18   Sindy Guadeloupe, MD  losartan (COZAAR) 25 MG tablet Take 25 mg by mouth daily.  03/14/18  [provider]  ondansetron (ZOFRAN) 8 MG tablet Take 1 tablet (8 mg total) by mouth 2 (two) times daily as needed for refractory nausea / vomiting. Start on day 3 after chemo. Patient not taking: Reported on 06/07/2018 04/05/18   Sindy Guadeloupe, MD  prochlorperazine (COMPAZINE) 10 MG tablet Take 1 tablet (10 mg total) by mouth every 6 (six) hours as needed (Nausea or vomiting). Patient not taking: Reported on  05/10/2018 04/05/18   Sindy Guadeloupe, MD  Spacer/Aero-Holding Dorise Bullion 1 each by Does not apply route daily. Patient not taking: Reported on 05/17/2018 04/06/18   Tyler Pita, MD    Allergies Aspirin  Family History  Problem Relation Age of Onset  . Diabetes Mother   . CAD Mother   . Stroke Father     Social History Social History   Tobacco Use  . Smoking status: Former Smoker    Packs/day: 1.00    Years: 58.00    Pack years: 58.00    Types: Cigarettes    Last attempt to quit: 03/30/2018    Years since quitting: 0.1  . Smokeless tobacco: Never Used  Substance Use Topics  . Alcohol use: Not Currently    Comment: daily 32 oz beer  . Drug use: Not Currently    Review of Systems  Constitutional: No fever.  Positive for weakness. Eyes: No redness. ENT: No sore throat. Cardiovascular: Denies chest pain. Respiratory: Positive for shortness of breath. Gastrointestinal: No vomiting or diarrhea.  Genitourinary: Negative for flank pain.  Musculoskeletal: Negative for back pain. Skin: Negative for rash. Neurological: Negative for headache.   ____________________________________________   PHYSICAL EXAM:  VITAL SIGNS: ED Triage Vitals  Enc Vitals Group     BP 06/07/18 0910 97/73     Pulse Rate 06/07/18 0910 (!) 170     Resp 06/07/18 0910 (!) 33     Temp 06/07/18 0910 99.2 F (37.3 C)     Temp Source 06/07/18 0910 Oral     SpO2 06/07/18 0910 95 %     Weight 06/07/18 0908 150 lb (68 kg)     Height 06/07/18 0908 _0  (1.753 m)     Head Circumference --      Peak Flow --      Pain Score 06/07/18 0907 0     Pain Loc --      Pain Edu? --      Excl. in [place]? --     Constitutional: Alert and oriented.  Weak appearing but in no acute distress. Eyes: Conjunctivae are normal.  Head: Atraumatic. Nose: No congestion/rhinnorhea. Mouth/Throat: Mucous membranes are somewhat dry.   Neck: Normal range of motion.  Cardiovascular: Tachycardic, regular rhythm.  Grossly normal heart sounds.  Good peripheral circulation. Respiratory: Normal respiratory effort.  No retractions.  Somewhat decreased breath sounds bilaterally. Gastrointestinal: No distention.  Musculoskeletal: No lower extremity edema.  Extremities warm and well perfused.  Neurologic:  Normal speech and language. No gross focal neurologic deficits are appreciated.  Skin:  Skin is warm and dry. No rash noted. Psychiatric: Mood and affect are normal. Speech and behavior are normal.  ____________________________________________   LABS (all labs ordered are listed, but only abnormal results are displayed)  Labs Reviewed  TROPONIN I - Abnormal; Notable for the following components:      Result Value   Troponin I 0.06 (*)    All other components within normal limits  URINALYSIS, COMPLETE (UACMP) WITH MICROSCOPIC  OSMOLALITY, URINE  SODIUM,  URINE, RANDOM  TYPE AND SCREEN   ____________________________________________  EKG  ED ECG REPORT I, Arta Silence, the attending physician, personally viewed and interpreted this ECG.  Date: 06/07/2018 EKG Time: 0911 Rate: 170 Rhythm: Supraventricular tachycardia QRS Axis: normal Intervals: normal ST/T Wave abnormalities: Nonspecific ST abnormalities Narrative Interpretation: SVT with no evidence of acute ischemia   ED ECG REPORT I, Arta Silence, the attending physician, personally viewed and interpreted this ECG.  Date: 06/07/2018 EKG Time: 0924 Rate: 121 Rhythm: Sinus tachycardia with PVCs QRS Axis: normal Intervals: normal ST/T Wave abnormalities: Nonspecific ST abnormality Narrative Interpretation: no evidence of acute ischemia   ____________________________________________  RADIOLOGY  CXR: No focal infiltrate or edema  ____________________________________________   PROCEDURES  Procedure(s) performed: No  Procedures  Critical Care performed: Yes  CRITICAL CARE Performed by: Arta Silence   Total critical care time: 30 minutes  Critical care time was exclusive of separately billable procedures and treating other patients.  Critical care was necessary to treat or prevent imminent or life-threatening deterioration.  Critical care was time spent personally by me on the following activities: development of treatment plan with patient and/or surrogate as well as nursing, discussions with consultants, evaluation of patient's response to treatment, examination of patient, obtaining history from patient or surrogate, ordering and performing treatments and interventions, ordering and review of laboratory studies, ordering and review of radiographic studies, pulse oximetry and re-evaluation of patient's condition. ____________________________________________   INITIAL IMPRESSION / ASSESSMENT AND PLAN / ED COURSE  Pertinent labs & imaging results that were available during my care of the patient were reviewed by me and considered in my medical decision making (see chart for details).  72 year old male with PMH as noted above presents with tachycardia which appears most consistent with SVT.  However, the patient does not report an acute onset of palpitations but rather dizziness and generalized weakness worsening gradually over the last several days.  On exam the patient appears somewhat pale and weak.  His heart rate is around 170 and I do not see P waves on the EKG.  His blood pressure is borderline.  He is alert and his extremities are well perfused.  Overall this does appear most consistent with SVT.  It is possible that it could be related to the patient's radiation and/or chemotherapy.  Differential also includes sinus tachycardia related to dehydration, anemia, or other subacute etiologies.  We will give adenosine, reassess heart rhythm, obtain lab work-up, give fluids, and reassess.  ----------------------------------------- 9:55 AM on  06/07/2018 -----------------------------------------  Immediately after getting adenosine the patient's heart rate slowed and then settled in the 120s in sinus rhythm.  However he did not have a complete absence of ventricular beats as I would expect with adenosine.  After moving around for the chest x-ray his heart rate went back up to the 160s.  Based on these additional findings it is certainly possible that the patient is simply in sinus tachycardia and the rhythm is too fast to see clear P waves although it still appears most consistent with SVT on the monitor and EKG.  Since his blood pressure is stable we will continue his initial fluid bolus and reassess the heart rate and rhythm.  If he slows gradually with fluids this will point towards sinus tachycardia rather than SVT.  ----------------------------------------- 12:21 PM on 06/07/2018 -----------------------------------------  The patient's heart rate did not respond at all to the fluid bolus and remained rate of 160 with an appearance consistent with SVT.  I  gave a dose of IV Cardizem and the patient responded to coming going back down to sinus in the 120s.  I then follow this with a p.o. dose.  The patient has remained stable with his heart rate around 110-120 but a somewhat borderline low blood pressure.  He feels significantly better.  Given that this was not a simple SVT which resolved with adenosine, he remains tachycardic with a borderline blood pressure, and may need further rate control, he will require admission.  The patient agrees with this plan.  We will also continue to hydrate him as his sodium is somewhat low.  The remainder of his labs are reassuring.  I signed the patient out to the hospitalist Dr. Leslye Peer.  ____________________________________________   FINAL CLINICAL IMPRESSION(S) / ED DIAGNOSES  Final diagnoses:  Supraventricular tachycardia ([place])  Hyponatremia      NEW MEDICATIONS STARTED DURING THIS  VISIT:  New Prescriptions   No medications on file     Note:  This document was prepared using Dragon voice recognition software and may include unintentional dictation errors.    Arta Silence, MD 06/07/18 1224

## 2018-06-07 NOTE — Progress Notes (Signed)
Patient ID: Philip Weiss, male   DOB: 06-24-1945, 72 y.o.   MRN: [id number]  Called to a rapid response.  Patient's heart rate in the 130s and short of breath and pulse ox was in the 70s and he was placed on 100% nonrebreather.  Patient having some wheezing in the lungs.  Since the patient had SVT and history of lung cancer and now wheezing in the lungs I wonder make sure he does not have a PE CT scan of the chest stat to rule out PE was ordered.  I stopped IV fluids.  I gave nebulizer treatments and Solu-Medrol.  Started antibiotics.  Since the patient also had a low-grade temperature I ordered a flu swab.  Cardiovascular system S1-S2 sinus tachycardia Lungs.  Decreased breath sounds bilaterally coarse slight expiratory wheeze.  Case discussed with nursing staff and rapid response team  Prolonged care 32 minutes Dr. Loletha Grayer

## 2018-06-07 NOTE — Progress Notes (Signed)
Symptom Management [place]  [phone] [phone]  Patient Care Team: Center, [facility] as PCP - General ([place]) Telford Nab, RN as Registered Nurse   Name of the patient: Philip Weiss  [id number]  18-Mar-1946   Date of visit: 06/07/18  Diagnosis-stage IIIc adenocarcinoma of the lung  Chief complaint/ Reason for visit-weakness and low blood pressure  Heme/Onc history:  patient is a 72 year old male with a long-standing history of smoking and is a current smoker with a 58-pack-year smoking history. He underwent lung cancer screening low-dose CT scan which showed a right middle lobe lung mass of about 4.8 cm. Subcarinal adenopathy 2.8 x 4 cm and probable right hilar adenopathy and soft tissue fullness 2.3 cm. This was followed by a PET CT scan which showed right middle lobe mass was hypermetabolic at 41.3 with hypermetabolic right hilar and infrahilar adenopathy. Hypermetabolic 2.5 cm subcarinal node and right paratracheal nodes. No hypermetabolic contralateral adenopathy. Left upper lobe 4 mm solitary pulmonary nodule below PET resolution. No evidence of hypermetabolic them elsewhere.  Biopsy of the right middle lobe showed non-small cell carcinoma favor adenocarcinoma. 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 However it showed acute cerebellar stroke for which patient was admitted to the hospital and underwent a work-up including carotid Dopplers as well as CTA of the head and neck and echo with bubble study which were unremarkable  Initiated chemotherapy on 04/19/2018, completed  cycles of weekly carbotaxol with concurrent radiation on 05/23/2018.  Plan to start radiation boost along with concurrent chemotherapy next week.    Lung cancer ([place])   04/05/2018 Initial Diagnosis   Lung cancer ([place])    04/05/2018 Cancer Staging    Staging form: Lung, AJCC 8th Edition - Clinical stage from 04/05/2018: cT2, cN3, cM0 - Signed by Sindy Guadeloupe, MD on 04/05/2018    04/11/2018 -  Chemotherapy    The patient had palonosetron (ALOXI) injection 0.25 mg, 0.25 mg, Intravenous,  Once, 5 of 5 cycles Administration: 0.25 mg (04/27/2018), 0.25 mg (05/03/2018), 0.25 mg (04/19/2018), 0.25 mg (05/10/2018), 0.25 mg (05/17/2018) CARBOplatin (PARAPLATIN) 180 mg in sodium chloride 0.9 % 250 mL chemo infusion, 180 mg (100 % of original dose 180.6 mg), Intravenous,  Once, 5 of 5 cycles Dose modification:   (original dose 180.6 mg, Cycle 2) Administration: 180 mg (04/27/2018), 180 mg (05/03/2018), 180 mg (04/19/2018), 180 mg (05/10/2018), 180 mg (05/17/2018) PACLitaxel (TAXOL) 84 mg in sodium chloride 0.9 % 250 mL chemo infusion (</= 42m/m2), 45 mg/m2 = 84 mg, Intravenous,  Once, 5 of 5 cycles Administration: 84 mg (04/27/2018), 84 mg (05/03/2018), 84 mg (04/19/2018), 84 mg (05/10/2018), 84 mg (05/17/2018)  for chemotherapy treatment.      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 He reported symptoms starting 2-3 days ago and ongoing. [facility] He has not taken any blood pressure medications. Has poor appetite. Has tried increasing fluids overnight. Complains of dizziness and feeling lightheaded gradually worsening. Denies chest pain. Has chronic shortness of breath, possibly slightly worse.   ECOG FS:3 - Symptomatic, >50% confined to bed  Review of systems- Review of Systems  Unable to perform ROS: Acuity of condition  Current treatment- carbo-taxol on 05/17/18. Completed radiation 05/23/18  Allergies  Allergen Reactions  . Aspirin Itching    Past Medical History:  Diagnosis Date  .  Hypertension   . Lung cancer [facility])     Past Surgical History:  Procedure Laterality Date  . CYST EXCISION Left    buttock  . ENDOBRONCHIAL ULTRASOUND Right 03/29/2018   Procedure: ENDOBRONCHIAL ULTRASOUND;  Surgeon: Tyler Pita, MD;  Location: [facility] ORS;  Service: Cardiopulmonary;  Laterality: Right;  . FLEXIBLE BRONCHOSCOPY Right 03/29/2018   Procedure: FLEXIBLE BRONCHOSCOPY;  Surgeon: Tyler Pita, MD;  Location: [facility] ORS;  Service: Cardiopulmonary;  Laterality: Right;  . PORTA CATH INSERTION N/A 04/13/2018   Procedure: PORTA CATH INSERTION;  Surgeon: Algernon Huxley, MD;  Location: [place] CV LAB;  Service: Cardiovascular;  Laterality: N/A;    Social History   Socioeconomic History  . Marital status: Married    Spouse name: Not on file  . Number of children: 4  . Years of education: Not on file  . Highest education level: Not on file  Occupational History  . Occupation: retired    Comment: mill/construction   Social Needs  . Financial resource strain: Not hard at all  . Food insecurity:    Worry: Never true    Inability: Never true  . Transportation needs:    Medical: No    Non-medical: No  Tobacco Use  . Smoking status: Former Smoker    Packs/day: 1.00    Years: 58.00    Pack years: 58.00    Types: Cigarettes    Last attempt to quit: 03/30/2018    Years since quitting: 0.1  . Smokeless tobacco: Never Used  Substance and Sexual Activity  . Alcohol use: Not Currently    Comment: daily 32 oz beer  . Drug use: Not Currently  . Sexual activity: Not on file  Lifestyle  . Physical activity:    Days per week: 0 days    Minutes per session: 0 min  . Stress: Only a little  Relationships  . Social connections:    Talks on phone: More than three times a week    Gets together: Twice a week    Attends religious service: More than 4 times per year    Active member of club or organization: Not on file    Attends meetings of clubs or organizations: Not on  file    Relationship status: Married  . Intimate partner violence:    Fear of current or ex partner: No    Emotionally abused: No    Physically abused: No    Forced sexual activity: No  Other Topics Concern  . Not on file  Social History Narrative  . Not on file    Family History  Problem Relation Age of Onset  . Diabetes Mother   . CAD Mother   . Stroke Father      Current Outpatient Medications:  .  albuterol (PROVENTIL HFA;VENTOLIN HFA) 108 (90 Base) MCG/ACT inhaler, Inhale 2 puffs into the lungs every 6 (six) hours as needed for wheezing or shortness of breath., Disp: 1 Inhaler, Rfl: 2 .  aspirin EC 81 MG EC tablet, Take 1 tablet (81 mg total) by mouth daily., Disp: 30 tablet, Rfl: 1 .  atorvastatin (LIPITOR) 40 MG tablet, Take 1 tablet (40 mg total) by mouth daily at 6 PM., Disp: 30 tablet, Rfl: 1 .  budesonide-formoterol (SYMBICORT) 160-4.5 MCG/ACT inhaler, Inhale 2 puffs into the lungs 2 (  two) times daily., Disp: , Rfl:  .  Cyanocobalamin (VITAMIN B-12 PO), Take 1 tablet by mouth daily., Disp: , Rfl:  .  losartan (COZAAR) 25 MG tablet, Take 25 mg by mouth daily. , Disp: , Rfl:  .  Multiple Vitamins-Minerals (MULTIVITAMIN PO), Take 1 tablet by mouth daily., Disp: , Rfl:  .  Pyridoxine HCl (VITAMIN B-6 PO), Take 1 tablet by mouth daily., Disp: , Rfl:  .  dexamethasone (DECADRON) 4 MG tablet, Take 2 tablets (8 mg total) by mouth daily. Start the day after chemotherapy for 2 days. (Patient not taking: Reported on 06/07/2018), Disp: 30 tablet, Rfl: 1 .  LORazepam (ATIVAN) 0.5 MG tablet, Take 1 tablet (0.5 mg total) by mouth every 6 (six) hours as needed (Nausea or vomiting). (Patient not taking: Reported on 05/10/2018), Disp: 30 tablet, Rfl: 0 .  ondansetron (ZOFRAN) 8 MG tablet, Take 1 tablet (8 mg total) by mouth 2 (two) times daily as needed for refractory nausea / vomiting. Start on day 3 after chemo. (Patient not taking: Reported on 06/07/2018), Disp: 30 tablet, Rfl: 1 .   prochlorperazine (COMPAZINE) 10 MG tablet, Take 1 tablet (10 mg total) by mouth every 6 (six) hours as needed (Nausea or vomiting). (Patient not taking: Reported on 05/10/2018), Disp: 30 tablet, Rfl: 1 .  Spacer/Aero-Holding Chambers DEVI, 1 each by Does not apply route daily. (Patient not taking: Reported on 05/17/2018), Disp: 1 each, Rfl: 0 No current facility-administered medications for this visit.   Facility-Administered Medications Ordered in Other Visits:  .  sodium chloride flush (NS) 0.9 % injection 10 mL, 10 mL, Intracatheter, PRN, Sindy Guadeloupe, MD  Physical exam:  Vitals:   06/07/18 0849  BP: (P) 99/65  Weight: (P) 148 lb (67.1 kg)   Physical Exam Vitals signs and nursing note reviewed.  Constitutional:      Appearance: He is ill-appearing.     Comments: Accompanied by wife  Cardiovascular:     Rate and Rhythm: Regular rhythm. Tachycardia present.     Comments: Rate auscultated at 170 bpm.  Pulmonary:     Effort: Pulmonary effort is normal.     Breath sounds: Normal breath sounds.  Skin:    Coloration: Skin is pale.  Neurological:     Mental Status: He is oriented to person, place, and time.      CMP Latest Ref Rng & Units 06/07/2018  Glucose 70 - 99 mg/dL 136(H)  BUN 8 - 23 mg/dL 15  Creatinine 0.61 - 1.24 mg/dL 0.94  Sodium 135 - 145 mmol/L 128(L)  Potassium 3.5 - 5.1 mmol/L 4.4  Chloride 98 - 111 mmol/L 95(L)  CO2 22 - 32 mmol/L 23  Calcium 8.9 - 10.3 mg/dL 8.6(L)  Total Protein 6.5 - 8.1 g/dL 6.8  Total Bilirubin 0.3 - 1.2 mg/dL 0.7  Alkaline Phos 38 - 126 U/L 93  AST 15 - 41 U/L 51(H)  ALT 0 - 44 U/L 50(H)   CBC Latest Ref Rng & Units 06/07/2018  WBC 4.0 - 10.5 K/uL 4.2  Hemoglobin 13.0 - 17.0 g/dL 8.9(L)  Hematocrit 39.0 - 52.0 % 27.5(L)  Platelets 150 - 400 K/uL 170    No images are attached to the encounter.  No results found.  Assessment and plan- Patient is a 72 y.o. 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   1.  SVT- EKG performed by nursing consistent with SVT. No evidence  of acute ischemia. Patient transferred to ER by nursing given critical condition. ER Triage nurse notified of transfer and asked to notify ER MD for possible adenosine.  2. Stage IIIc adenocarcinoma of the right lung-  I suspect underlying tachycardia and fluid deficit given recent treatment with concurrent chemo (carbo-taxol) and radiation for stage IIIc adenocarcinoma of right lung. Sodium 128 supporting this. Follow up with medical oncology upon discharge.   Visit Diagnosis 1. Malignant neoplasm of middle lobe of right lung ([place])   2. SVT (supraventricular tachycardia) ([place])     Patient expressed understanding and was in agreement with this plan. He also understands that He can call clinic at any time with any questions, concerns, or complaints.   Thank you for allowing me to participate in the care of this very pleasant patient.   Beckey Rutter, DNP, AGNP-C [place] at [facility] [phone] (work cell) [phone] (office)  CC: Dr. Janese Banks

## 2018-06-07 NOTE — ED Notes (Signed)
hospitalist at the bedside 

## 2018-06-07 NOTE — ED Notes (Signed)
First Nurse Note: AP 174 taken directly to Rm 3 by Rosine Abe.  Patient alert and oriented.

## 2018-06-07 NOTE — Progress Notes (Signed)
   06/07/18 1820  Clinical Encounter Type  Visited With Patient not available;Health care provider  Visit Type Code  Spiritual Encounters  Spiritual Needs Prayer   Rapid response-chaplain maintained silent and energetic prayers as care team responded.  Chaplain checked in with [facility] before exiting.

## 2018-06-07 NOTE — ED Notes (Signed)
Pt had labs drawn at the cancer center this morning at 830am as seen in results.

## 2018-06-07 NOTE — ED Notes (Signed)
Portable xray performed in the room. Pt HR now ST at 166, EDP notified of change in rate. Continue to monitor the pt, fluids infusing as ordered.

## 2018-06-07 NOTE — H&P (Addendum)
[facility] at [place] NAME: Philip Weiss    MR#:  [id number]  DATE OF BIRTH:  01/02/1946  DATE OF ADMISSION:  06/07/2018  PRIMARY CARE PHYSICIAN: [facility], [place]   REQUESTING/REFERRING PHYSICIAN: Dr Wallie Renshaw  CHIEF COMPLAINT:   Chief Complaint  Patient presents with  . Tachycardia    HISTORY OF PRESENT ILLNESS:  Philip Weiss  is a 72 y.o. male with a known history of adenocarcinoma of the lung.  Patient was sent in for SVT.  Patient has had a headache and some shortness of breath.  His blood pressures been low and his pulse is been high for couple days.  He has been feeling fatigued.  He has had some weight loss.  He occasionally feels palpitations.  No complaints of chest pain.  Family states he finished his radiation therapy on December 16 and he is taking a break from that.  His last chemotherapy was before that.  Hospitalist services were contacted for further evaluation.  In the ER he was given adenosine and then given short acting Cardizem.  PAST MEDICAL HISTORY:   Past Medical History:  Diagnosis Date  . CVA (cerebral vascular accident) ([place])   . Hypertension   . Lung cancer ([place])     PAST SURGICAL HISTORY:   Past Surgical History:  Procedure Laterality Date  . CYST EXCISION Left    buttock  . ENDOBRONCHIAL ULTRASOUND Right 03/29/2018   Procedure: ENDOBRONCHIAL ULTRASOUND;  Surgeon: Tyler Pita, MD;  Location: [facility] ORS;  Service: Cardiopulmonary;  Laterality: Right;  . FLEXIBLE BRONCHOSCOPY Right 03/29/2018   Procedure: FLEXIBLE BRONCHOSCOPY;  Surgeon: Tyler Pita, MD;  Location: [facility] ORS;  Service: Cardiopulmonary;  Laterality: Right;  . PORTA CATH INSERTION N/A 04/13/2018   Procedure: PORTA CATH INSERTION;  Surgeon: Algernon Huxley, MD;  Location: [place] CV LAB;  Service: Cardiovascular;  Laterality: N/A;    SOCIAL HISTORY:   Social History   Tobacco Use  . Smoking  status: Former Smoker    Packs/day: 1.00    Years: 58.00    Pack years: 58.00    Types: Cigarettes    Last attempt to quit: 03/30/2018    Years since quitting: 0.1  . Smokeless tobacco: Never Used  Substance Use Topics  . Alcohol use: Not Currently    Comment: daily 32 oz beer    FAMILY HISTORY:   Family History  Problem Relation Age of Onset  . Diabetes Mother   . CAD Mother   . Stroke Father     DRUG ALLERGIES:   Allergies  Allergen Reactions  . Aspirin Itching    Patient states may not be allergic, he was taking too many aspirin and became itchy. As long as he takes one tablet daily, he is fine    REVIEW OF SYSTEMS:  CONSTITUTIONAL: No fever, chills or sweats.  Positive for fatigue.  Positive for weight loss 5 to 10 pounds.  EYES: No blurred or double vision.  EARS, NOSE, AND THROAT: No tinnitus or ear pain. No sore throat RESPIRATORY: Some shortness of breath with exertion.  Slight cough with clear phlegm.  No wheezing or hemoptysis.  CARDIOVASCULAR: No chest pain, orthopnea, edema.  GASTROINTESTINAL: Some nausea. no vomiting, diarrhea or abdominal pain. No blood in bowel movements GENITOURINARY: No dysuria, hematuria.  ENDOCRINE: No polyuria, nocturia,  HEMATOLOGY: No anemia, easy bruising or bleeding SKIN: No rash or lesion. MUSCULOSKELETAL: No joint pain or arthritis.  NEUROLOGIC: No tingling, numbness, weakness.  PSYCHIATRY: No anxiety or depression.   MEDICATIONS AT HOME:   Prior to Admission medications   Medication Sig Start Date End Date Taking? Authorizing Provider  aspirin EC 81 MG EC tablet Take 1 tablet (81 mg total) by mouth daily. 04/09/18  Yes Fritzi Mandes, MD  atorvastatin (LIPITOR) 40 MG tablet Take 1 tablet (40 mg total) by mouth daily at 6 PM. 04/08/18  Yes Fritzi Mandes, MD  budesonide-formoterol [facility]) 160-4.5 MCG/ACT inhaler Inhale 2 puffs into the lungs 2 (two) times daily.   Yes [provider]  Cyanocobalamin (VITAMIN B-12 PO)  Take 1 tablet by mouth daily.   Yes [provider]  Multiple Vitamins-Minerals (MULTIVITAMIN PO) Take 1 tablet by mouth daily.   Yes [provider]  Pyridoxine HCl (VITAMIN B-6 PO) Take 1 tablet by mouth daily.   Yes [provider]  losartan (COZAAR) 25 MG tablet Take 25 mg by mouth daily.  03/14/18   [provider]      VITAL SIGNS:  Blood pressure 101/69, pulse (!) 117, temperature 99.2 F (37.3 C), temperature source Oral, resp. rate (!) 22, height _0  (1.753 m), weight 68 kg, SpO2 98 %.  PHYSICAL EXAMINATION:  GENERAL:  72 y.o.-year-old patient lying in the bed with no acute distress.  EYES: Pupils equal, round, reactive to light and accommodation. No scleral icterus. Extraocular muscles intact.  HEENT: Head atraumatic, normocephalic. Oropharynx and nasopharynx clear.  NECK:  Supple, no jugular venous distention. No thyroid enlargement, no tenderness.  LUNGS: Decreased breath sounds bilateral base, no wheezing, rales,rhonchi or crepitation. No use of accessory muscles of respiration.  CARDIOVASCULAR: S1, S2 normal. No murmurs, rubs, or gallops.  ABDOMEN: Soft, nontender, nondistended. Bowel sounds present. No organomegaly or mass.  EXTREMITIES: No pedal edema, cyanosis, or clubbing.  NEUROLOGIC: Cranial nerves II through XII are intact. Muscle strength 5/5 in all extremities. Sensation intact. Gait not checked.  PSYCHIATRIC: The patient is alert and oriented x 3.  SKIN: No rash, lesion, or ulcer.   LABORATORY PANEL:   CBC Recent Labs  Lab 06/07/18 0823  WBC 4.2  HGB 8.9*  HCT 27.5*  PLT 170   ------------------------------------------------------------------------------------------------------------------  Chemistries  Recent Labs  Lab 06/07/18 0823  NA 128*  K 4.4  CL 95*  CO2 23  GLUCOSE 136*  BUN 15  CREATININE 0.94  CALCIUM 8.6*  AST 51*  ALT 50*  ALKPHOS 93  BILITOT 0.7    ------------------------------------------------------------------------------------------------------------------  Cardiac Enzymes Recent Labs  Lab 06/07/18 0917  TROPONINI 0.06*   ------------------------------------------------------------------------------------------------------------------  RADIOLOGY:  Dg Chest Portable 1 View  Result Date: 06/07/2018 CLINICAL DATA:  Two day history of dizziness and weakness. History of lung malignancy and hypertension. Discontinued smoking in October 2019 EXAM: PORTABLE CHEST 1 VIEW COMPARISON:  CT scan of the chest of March 17, 2018 and PET-CT study of March 23, 2018. FINDINGS: The lungs are well-expanded. The interstitial markings are coarse especially in the right infrahilar region. There is no alveolar infiltrate. The heart is top-normal in size. The pulmonary vascularity is not engorged. External pacemaker defibrillator pads are present. A porta catheter is present with the tip projecting over the distal third of the SVC. IMPRESSION: No focal pneumonia. Mild interstitial prominence bilaterally which is likely chronic. Top-normal cardiac size without pulmonary edema. Electronically Signed   By: David  Martinique M.D.   On: 06/07/2018 09:45    EKG:   First EKG SVT 170 bpm nonspecific ST-T wave  changes  IMPRESSION AND PLAN:   1.  SVT.  On recent hospitalization the patient had a echo with a normal EF.  Will observe overnight on telemetry.  Start long-acting Cardizem CD 180 mg daily.  PRN short acting Cardizem. 2.  Hyponatremia.  Gentle IV fluids.  Send off urine sodium and urine osmolarity. 3.  History of lung cancer.  Radiation and chemotherapy to be continued next week. 4.  History of CVA on aspirin and cholesterol medication 5.  Hyperlipidemia unspecified on cholesterol medication 6.  Slightly elevated liver function test.  Recheck tomorrow 7.  Elevated troponin likely secondary to fast heart rate and demand ischemia.  Check a couple more  troponins 8.  Anemia of chronic disease.  All the records are reviewed and case discussed with ED provider. Management plans discussed with the patient, family and they are in agreement.  CODE STATUS: DNR  TOTAL TIME TAKING CARE OF THIS PATIENT: 50 minutes, including ACP time.    Loletha Grayer M.D on 06/07/2018 at 12:36 PM  Between 7am to 6pm - Pager - [phone]  After 6pm call admission pager [phone]  [employer] Office  [phone]  CC: Primary care physician; Center, [facility]

## 2018-06-07 NOTE — Progress Notes (Signed)
Patient admitted to unit. Oriented to room, call bell, and staff. Bed in lowest position. Fall safety plan reviewed. Full assessment to Epic. Skin assessment verified with Danae Chen RN. Telemetry box verification with tele clerk- Box#: [id number]. Will continue to monitor.

## 2018-06-07 NOTE — ED Notes (Signed)
Pt given syringe and instructed to blow for vagal attempt

## 2018-06-07 NOTE — ED Notes (Signed)
Per pt wife, they went to the [facility] due to low b/p yesterday and the pt having increased generalized weakness with dizziness. States when they went to the Center pt was noted to be in SVT and referred to the ED for treatment. Pt is in NAD. Pt had last received chemo and radiation tx 12/16, states he is on break right now and the next tx is 06/13/2018.

## 2018-06-07 NOTE — ED Notes (Signed)
Date and time results received: 06/07/18 1001 (use smartphrase ".now" to insert current time)  Test: troponin Critical Value: 0.06  Name of Provider Notified: Siadecki  Orders Received? Or Actions Taken?: none

## 2018-06-07 NOTE — Progress Notes (Signed)
Back from CT scan. RN went with patient. He seems to be breathing slightly better now.

## 2018-06-07 NOTE — Progress Notes (Signed)
Patient ID: Philip Weiss, male   DOB: 09/18/1945, 72 y.o.   MRN: [id number]  ACP note  Patient, wife and daughter at the bedside  Diagnosis: SVT, hyponatremia, adenocarcinoma of the lung, history of stroke, hyperlipidemia unspecified  CODE STATUS discussed and patient wishes to be a DNR  Plan.  Observe overnight on telemetry.  Start Cardizem CD.  Recent echocardiogram was normal EF.  Hyponatremia send off urine osmolarity and urine random sodium and gentle IV fluid hydration.  Follow-up with lung cancer treatment as outpatient.  Time spent on ACP discussion 17 minutes Dr. Loletha Grayer

## 2018-06-07 NOTE — ED Triage Notes (Signed)
Sent from [facility] for ED evaluation of SVT.  Per report, patient present to [place] today for c/o dizziness and weakness x 2 days.  Patient arrives AAOx3.  Skin warm and dry.  Pale.  DOE noted.

## 2018-06-08 LAB — BASIC METABOLIC PANEL
Anion gap: 7 (ref 5–15)
BUN: 14 mg/dL (ref 8–23)
CO2: 20 mmol/L — ABNORMAL LOW (ref 22–32)
Calcium: 7.8 mg/dL — ABNORMAL LOW (ref 8.9–10.3)
Chloride: 103 mmol/L (ref 98–111)
Creatinine, Ser: 0.78 mg/dL (ref 0.61–1.24)
GFR calc Af Amer: >60 mL/min (ref >60)
GFR calc non Af Amer: >60 mL/min (ref >60)
Glucose, Bld: 189 mg/dL — ABNORMAL HIGH (ref 70–99)
Potassium: 4.3 mmol/L (ref 3.5–5.1)
Sodium: 130 mmol/L — ABNORMAL LOW (ref 135–145)

## 2018-06-08 LAB — CBC
HCT: 22.4 % — ABNORMAL LOW (ref 39.0–52.0)
Hemoglobin: 7.7 g/dL — ABNORMAL LOW (ref 13.0–17.0)
MCH: 34.1 pg — ABNORMAL HIGH (ref 26.0–34.0)
MCHC: 34.4 g/dL (ref 30.0–36.0)
MCV: 99.1 fL (ref 80.0–100.0)
PLATELETS: 164 10*3/uL (ref 150–400)
RBC: 2.26 MIL/uL — ABNORMAL LOW (ref 4.22–5.81)
RDW: 19 % — ABNORMAL HIGH (ref 11.5–15.5)
WBC: 3.5 10*3/uL — ABNORMAL LOW (ref 4.0–10.5)
nRBC: 0 % (ref 0.0–0.2)

## 2018-06-08 LAB — HEPATIC FUNCTION PANEL
ALT: 46 U/L — ABNORMAL HIGH (ref 0–44)
AST: 50 U/L — ABNORMAL HIGH (ref 15–41)
Albumin: 2 g/dL — ABNORMAL LOW (ref 3.5–5.0)
Alkaline Phosphatase: 84 U/L (ref 38–126)
BILIRUBIN DIRECT: 0.2 mg/dL (ref 0.0–0.2)
Indirect Bilirubin: 0.2 mg/dL — ABNORMAL LOW (ref 0.3–0.9)
Total Bilirubin: 0.4 mg/dL (ref 0.3–1.2)
Total Protein: 5.7 g/dL — ABNORMAL LOW (ref 6.5–8.1)

## 2018-06-08 MED ORDER — IPRATROPIUM-ALBUTEROL 0.5-2.5 (3) MG/3ML IN SOLN
3.0000 mL | Freq: Two times a day (BID) | RESPIRATORY_TRACT | Status: DC
  Administered 2018-06-08 – 2018-06-10 (×5): 3 mL via RESPIRATORY_TRACT
  Filled 2018-06-08 (×5): qty 3

## 2018-06-08 MED ORDER — GUAIFENESIN-DM 100-10 MG/5ML PO SYRP
5.0000 mL | ORAL_SOLUTION | ORAL | Status: DC | PRN
  Administered 2018-06-08 – 2018-06-09 (×2): 5 mL via ORAL
  Filled 2018-06-08 (×2): qty 5

## 2018-06-08 NOTE — Progress Notes (Signed)
Heart rate staying 80-90s this morning. Per Dr. Bridgett Larsson, give PO cardizem dose this morning and try to wean off cardizem gtt.

## 2018-06-08 NOTE — Progress Notes (Addendum)
[place] [facility] [place] NAME: Philip Weiss    MR#:  [id number]  DATE OF BIRTH:  [birth year]  SUBJECTIVE:  CHIEF COMPLAINT:   Chief Complaint  Patient presents with  . Tachycardia   Better shortness of breath and cough.  On oxygen by nasal cannula 3 L. REVIEW OF SYSTEMS:  Review of Systems  Constitutional: Positive for malaise/fatigue. Negative for chills and fever.  HENT: Negative for sore throat.   Eyes: Negative for blurred vision and double vision.  Respiratory: Positive for cough, sputum production and shortness of breath. Negative for hemoptysis, wheezing and stridor.   Cardiovascular: Negative for chest pain, palpitations, orthopnea and leg swelling.  Gastrointestinal: Negative for abdominal pain, blood in stool, diarrhea, melena, nausea and vomiting.  Genitourinary: Negative for dysuria, flank pain and hematuria.  Musculoskeletal: Negative for back pain and joint pain.  Skin: Negative for rash.  Neurological: Negative for dizziness, sensory change, focal weakness, seizures, loss of consciousness, weakness and headaches.  Endo/Heme/Allergies: Negative for polydipsia.  Psychiatric/Behavioral: Negative for depression. The patient is not nervous/anxious.     DRUG ALLERGIES:   Allergies  Allergen Reactions  . Aspirin Itching    Patient states may not be allergic, he was taking too many aspirin and became itchy. As long as he takes one tablet daily, he is fine   VITALS:  Blood pressure 121/65, pulse 85, temperature (!) 97.3 F (36.3 C), temperature source Oral, resp. rate 16, height 5\' 9"  (1.753 m), weight 67 kg, SpO2 95 %. PHYSICAL EXAMINATION:  Physical Exam Constitutional:      General: He is not in acute distress. HENT:     Head: Normocephalic.     Mouth/Throat:     Mouth: Mucous membranes are moist.  Eyes:     General: No scleral icterus.    Conjunctiva/sclera: Conjunctivae normal.     Pupils: Pupils are equal, round, and  reactive to light.  Neck:     Musculoskeletal: Normal range of motion and neck supple.     Vascular: No JVD.     Trachea: No tracheal deviation.  Cardiovascular:     Rate and Rhythm: Normal rate and regular rhythm.     Heart sounds: Normal heart sounds. No murmur. No gallop.   Pulmonary:     Effort: Pulmonary effort is normal. No respiratory distress.     Breath sounds: Normal breath sounds. No stridor. No wheezing, rhonchi or rales.     Comments: Bilateral basilar crackles. Chest:     Chest wall: No tenderness.  Abdominal:     General: Bowel sounds are normal. There is no distension.     Palpations: Abdomen is soft.     Tenderness: There is no abdominal tenderness. There is no rebound.  Musculoskeletal: Normal range of motion.        General: No tenderness.     Right lower leg: No edema.     Left lower leg: No edema.  Skin:    Findings: No erythema or rash.  Neurological:     General: No focal deficit present.     Mental Status: He is alert and oriented to person, place, and time.     Cranial Nerves: No cranial nerve deficit.  Psychiatric:        Mood and Affect: Mood normal.    LABORATORY PANEL:  Male CBC Recent Labs  Lab 06/08/18 0427  WBC 3.5*  HGB 7.7*  HCT 22.4*  PLT 164   ------------------------------------------------------------------------------------------------------------------  Chemistries  Recent Labs  Lab 06/08/18 0427  NA 130*  K 4.3  CL 103  CO2 20*  GLUCOSE 189*  BUN 14  CREATININE 0.78  CALCIUM 7.8*  AST 50*  ALT 46*  ALKPHOS 84  BILITOT 0.4   RADIOLOGY:  Ct Angio Chest Pe W Or Wo Contrast  Result Date: 06/07/2018 CLINICAL DATA:  73 year old male with history of adenocarcinoma of the lung. Shortness of breath. Supraventricular tachycardia. EXAM: CT ANGIOGRAPHY CHEST WITH CONTRAST TECHNIQUE: Multidetector CT imaging of the chest was performed using the standard protocol during bolus administration of intravenous contrast. Multiplanar  CT image reconstructions and MIPs were obtained to evaluate the vascular anatomy. CONTRAST:  68mL OMNIPAQUE IOHEXOL 350 MG/ML SOLN COMPARISON:  PET-CT 03/23/2018.  Chest CT 03/17/2018. FINDINGS: Cardiovascular: No filling defects within the pulmonary arterial tree to suggest underlying pulmonary embolism. Heart size is mildly enlarged. There is aortic atherosclerosis, as well as atherosclerosis of the great vessels of the mediastinum and the coronary arteries, including calcified atherosclerotic plaque in the left main and left anterior descending coronary arteries. Right internal jugular single-lumen porta cath with tip terminating in the distal superior vena cava. Mediastinum/Nodes: Multiple prominent borderline enlarged and enlarged mediastinal and hilar lymph nodes, largest of which is a subcarinal lymph node measuring up to 2.1 cm in short axis. Esophagus is unremarkable in appearance. Esophagus is unremarkable in appearance. No axillary lymphadenopathy. Lungs/Pleura: Widespread but patchy areas of ground-glass attenuation and septal thickening scattered throughout the lungs bilaterally, significantly increased compared to the prior study from October 2019. Scattered areas of cylindrical and mild varicose bronchiectasis. Some thick-walled cystic changes are noted in the right upper lobe. Volume loss and scarring in the right middle lobe. Trace bilateral pleural effusions. Upper Abdomen: Unremarkable. Musculoskeletal: There are no aggressive appearing lytic or blastic lesions noted in the visualized portions of the skeleton. Review of the MIP images confirms the above findings. IMPRESSION: 1. No evidence of pulmonary embolism. 2. The appearance the chest suggests severe multilobar pneumonia. There also scattered areas of what appear to be post infectious or inflammatory scarring in the lungs bilaterally. 3. Aortic atherosclerosis, in addition to left main and left anterior descending coronary artery disease.  Assessment for potential risk factor modification, dietary therapy or pharmacologic therapy may be warranted, if clinically indicated. Aortic Atherosclerosis (ICD10-I70.0). Electronically Signed   By: Vinnie Langton M.D.   On: 06/07/2018 19:20   ASSESSMENT AND PLAN:  Philip Weiss  is a 73 y.o. male with a known history of adenocarcinoma of the lung.  He is admitted for SVT but found multifocal pneumonia.  Acute respiratory failure due to multifocal pneumonia. Continue oxygen by nasal cannula, NEB PRN, continue Zithromax and Rocephin.  Robitussin as needed.  1.  SVT.  On recent hospitalization the patient had a echo with a normal EF.   He was on Cardizem drip, heart rate is better controlled. Started long-acting Cardizem CD 180 mg daily.  PRN short acting Cardizem. 2.  Hyponatremia.    Possible related to lung cancer.  Gentle IV fluids, follow-up BMP.  3.  History of lung cancer.  Radiation and chemotherapy to be continued next week.  4.  History of CVA on aspirin and cholesterol medication 5.  Hyperlipidemia unspecified on cholesterol medication 6.  Slightly elevated liver function test.    Stable. 7.  Elevated troponin likely secondary to fast heart rate and demand ischemia.  8.  Anemia of chronic disease.  Hemoglobin decreased to 7.7, possible due to  IV fluid dilution.  All the records are reviewed and case discussed with Care Management/Social Worker. Management plans discussed with the patient, his wife and they are in agreement.  CODE STATUS: DNR  TOTAL TIME TAKING CARE OF THIS PATIENT: 33 minutes.   More than 50% of the time was spent in counseling/coordination of care: YES  POSSIBLE D/C IN 2-3 DAYS, DEPENDING ON CLINICAL CONDITION.   Demetrios Loll M.D on 06/08/2018 at 3:47 PM  Between 7am to 6pm - Pager - [phone]  After 6pm go to [email or web address] - [facility]

## 2018-06-08 NOTE — Progress Notes (Addendum)
Patient ambulated one lap around nursing station with standby assist from RN. Heart rate stayed below 105. About halfway through O2 dropped to 87% on 3L, but quickly returned to 93% after sitting to rest for a few minutes. Patient finished lap with no difficulty. Back to bed with wife at bedside. Will continue to monitor.   Refusing bed alarm while wife is present. Agrees to call for assistance before getting out of bed.

## 2018-06-09 ENCOUNTER — Ambulatory Visit: Payer: Medicare PPO

## 2018-06-09 DIAGNOSIS — R531 Weakness: Secondary | ICD-10-CM

## 2018-06-09 DIAGNOSIS — C3490 Malignant neoplasm of unspecified part of unspecified bronchus or lung: Secondary | ICD-10-CM

## 2018-06-09 DIAGNOSIS — J9601 Acute respiratory failure with hypoxia: Secondary | ICD-10-CM

## 2018-06-09 DIAGNOSIS — I471 Supraventricular tachycardia: Secondary | ICD-10-CM

## 2018-06-09 DIAGNOSIS — F17211 Nicotine dependence, cigarettes, in remission: Secondary | ICD-10-CM

## 2018-06-09 DIAGNOSIS — J181 Lobar pneumonia, unspecified organism: Secondary | ICD-10-CM

## 2018-06-09 LAB — RETIC PANEL
IMMATURE RETIC FRACT: 37.6 % — AB (ref 2.3–15.9)
RBC.: 2.27 MIL/uL — ABNORMAL LOW (ref 4.22–5.81)
Retic Count, Absolute: 66.1 10*3/uL (ref 19.0–186.0)
Retic Ct Pct: 2.9 % (ref 0.4–3.1)
Reticulocyte Hemoglobin: 32.1 pg (ref >27.9)

## 2018-06-09 LAB — BASIC METABOLIC PANEL
ANION GAP: 8 (ref 5–15)
BUN: 16 mg/dL (ref 8–23)
CALCIUM: 8 mg/dL — AB (ref 8.9–10.3)
CO2: 20 mmol/L — ABNORMAL LOW (ref 22–32)
Chloride: 102 mmol/L (ref 98–111)
Creatinine, Ser: 0.74 mg/dL (ref 0.61–1.24)
GFR calc Af Amer: >60 mL/min (ref >60)
GFR calc non Af Amer: >60 mL/min (ref >60)
Glucose, Bld: 158 mg/dL — ABNORMAL HIGH (ref 70–99)
Potassium: 4.1 mmol/L (ref 3.5–5.1)
Sodium: 130 mmol/L — ABNORMAL LOW (ref 135–145)

## 2018-06-09 LAB — IRON AND TIBC
Iron: 53 ug/dL (ref 45–182)
Saturation Ratios: 42 % — ABNORMAL HIGH (ref 17.9–39.5)
TIBC: 126 ug/dL — AB (ref 250–450)
UIBC: 73 ug/dL

## 2018-06-09 LAB — CBC
HCT: 22.3 % — ABNORMAL LOW (ref 39.0–52.0)
Hemoglobin: 7.3 g/dL — ABNORMAL LOW (ref 13.0–17.0)
MCH: 32.4 pg (ref 26.0–34.0)
MCHC: 32.7 g/dL (ref 30.0–36.0)
MCV: 99.1 fL (ref 80.0–100.0)
NRBC: 0 % (ref 0.0–0.2)
Platelets: 180 10*3/uL (ref 150–400)
RBC: 2.25 MIL/uL — ABNORMAL LOW (ref 4.22–5.81)
RDW: 18.4 % — ABNORMAL HIGH (ref 11.5–15.5)
WBC: 7.2 10*3/uL (ref 4.0–10.5)

## 2018-06-09 LAB — PREPARE RBC (CROSSMATCH)

## 2018-06-09 LAB — FERRITIN: Ferritin: 845 ng/mL — ABNORMAL HIGH (ref 24–336)

## 2018-06-09 LAB — MAGNESIUM: Magnesium: 2.3 mg/dL (ref 1.7–2.4)

## 2018-06-09 MED ORDER — SODIUM CHLORIDE 0.9 % IV SOLN
INTRAVENOUS | Status: DC | PRN
  Administered 2018-06-09: 500 mL via INTRAVENOUS

## 2018-06-09 MED ORDER — SODIUM CHLORIDE 0.9% IV SOLUTION
Freq: Once | INTRAVENOUS | Status: AC
  Administered 2018-06-09: 19:00:00 via INTRAVENOUS

## 2018-06-09 MED ORDER — ACETAMINOPHEN 325 MG PO TABS
650.0000 mg | ORAL_TABLET | Freq: Once | ORAL | Status: AC
  Administered 2018-06-09: 650 mg via ORAL
  Filled 2018-06-09: qty 2

## 2018-06-09 MED ORDER — BISACODYL 5 MG PO TBEC: 5.0000 mg | DELAYED_RELEASE_TABLET | Freq: Every day | ORAL | Status: DC | PRN

## 2018-06-09 MED ORDER — SENNA 8.6 MG PO TABS: 1.0000 | ORAL_TABLET | Freq: Every day | ORAL | Status: DC | PRN

## 2018-06-09 NOTE — Progress Notes (Signed)
[place] [facility] [place] NAME: Philip Weiss    MR#:  [id number]  DATE OF BIRTH:  [birth year]  SUBJECTIVE:  CHIEF COMPLAINT:   Chief Complaint  Patient presents with  . Tachycardia   Better shortness of breath and cough.  On oxygen by nasal cannula 3 L.  Hypoxia without oxygen. REVIEW OF SYSTEMS:  Review of Systems  Constitutional: Positive for malaise/fatigue. Negative for chills and fever.  HENT: Negative for sore throat.   Eyes: Negative for blurred vision and double vision.  Respiratory: Positive for cough, sputum production and shortness of breath. Negative for hemoptysis, wheezing and stridor.   Cardiovascular: Negative for chest pain, palpitations, orthopnea and leg swelling.  Gastrointestinal: Negative for abdominal pain, blood in stool, diarrhea, melena, nausea and vomiting.  Genitourinary: Negative for dysuria, flank pain and hematuria.  Musculoskeletal: Negative for back pain and joint pain.  Skin: Negative for rash.  Neurological: Negative for dizziness, sensory change, focal weakness, seizures, loss of consciousness, weakness and headaches.  Endo/Heme/Allergies: Negative for polydipsia.  Psychiatric/Behavioral: Negative for depression. The patient is not nervous/anxious.     DRUG ALLERGIES:   Allergies  Allergen Reactions  . Aspirin Itching    Patient states may not be allergic, he was taking too many aspirin and became itchy. As long as he takes one tablet daily, he is fine   VITALS:  Blood pressure 111/73, pulse 92, temperature (!) 97.3 F (36.3 C), temperature source Oral, resp. rate 18, height 5\' 9"  (1.753 m), weight 66.7 kg, SpO2 100 %. PHYSICAL EXAMINATION:  Physical Exam Constitutional:      General: He is not in acute distress. HENT:     Head: Normocephalic.     Mouth/Throat:     Mouth: Mucous membranes are moist.  Eyes:     General: No scleral icterus.    Conjunctiva/sclera: Conjunctivae normal.     Pupils:  Pupils are equal, round, and reactive to light.  Neck:     Musculoskeletal: Normal range of motion and neck supple.     Vascular: No JVD.     Trachea: No tracheal deviation.  Cardiovascular:     Rate and Rhythm: Normal rate and regular rhythm.     Heart sounds: Normal heart sounds. No murmur. No gallop.   Pulmonary:     Effort: Pulmonary effort is normal. No respiratory distress.     Breath sounds: Normal breath sounds. No stridor. No wheezing, rhonchi or rales.     Comments: Bilateral basilar crackles. Chest:     Chest wall: No tenderness.  Abdominal:     General: Bowel sounds are normal. There is no distension.     Palpations: Abdomen is soft.     Tenderness: There is no abdominal tenderness. There is no rebound.  Musculoskeletal: Normal range of motion.        General: No tenderness.     Right lower leg: No edema.     Left lower leg: No edema.  Skin:    Findings: No erythema or rash.  Neurological:     General: No focal deficit present.     Mental Status: He is alert and oriented to person, place, and time.     Cranial Nerves: No cranial nerve deficit.  Psychiatric:        Mood and Affect: Mood normal.    LABORATORY PANEL:  Male CBC Recent Labs  Lab 06/09/18 0409  WBC 7.2  HGB 7.3*  HCT 22.3*  PLT  180   ------------------------------------------------------------------------------------------------------------------ Chemistries  Recent Labs  Lab 06/08/18 0427 06/09/18 0409  NA 130* 130*  K 4.3 4.1  CL 103 102  CO2 20* 20*  GLUCOSE 189* 158*  BUN 14 16  CREATININE 0.78 0.74  CALCIUM 7.8* 8.0*  MG  --  2.3  AST 50*  --   ALT 46*  --   ALKPHOS 84  --   BILITOT 0.4  --    RADIOLOGY:  No results found. ASSESSMENT AND PLAN:  Philip Weiss  is a 73 y.o. male with a known history of adenocarcinoma of the lung.  He is admitted for SVT but found multifocal pneumonia.  Acute respiratory failure due to multifocal pneumonia. Try to wean off oxygen by nasal  cannula, NEB PRN, continue Zithromax and Rocephin.  Robitussin as needed.  1.  SVT.  On recent hospitalization the patient had a echo with a normal EF.   He was on Cardizem drip, heart rate is better controlled. Started long-acting Cardizem CD 180 mg daily.  PRN short acting Cardizem. 2.  Hyponatremia.    Possible related to lung cancer.   He is treated with normal saline IV.  Sodium levels are stable at 130.  3.  History of lung cancer.  Radiation and chemotherapy to be continued next week.  4.  History of CVA on aspirin and cholesterol medication 5.  Hyperlipidemia unspecified on cholesterol medication 6.  Slightly elevated liver function test.    Stable. 7.  Elevated troponin likely secondary to fast heart rate and demand ischemia.  8.  Anemia of chronic disease.  Hemoglobin decreased to 7.3, possible due to IV fluid dilution.  No active bleeding.  Anemia work-up is unremarkable.  Hematology consult.  All the records are reviewed and case discussed with Care Management/Social Worker. Management plans discussed with the patient, his wife and they are in agreement.  CODE STATUS: DNR  TOTAL TIME TAKING CARE OF THIS PATIENT: 33 minutes.   More than 50% of the time was spent in counseling/coordination of care: YES  POSSIBLE D/C IN 2 DAYS, DEPENDING ON CLINICAL CONDITION.   Demetrios Loll M.D on 06/09/2018 at 4:07 PM  Between 7am to 6pm - Pager - [phone]  After 6pm go to [email or web address] - [facility]

## 2018-06-09 NOTE — Consult Note (Addendum)
Hematology/Oncology Consult note [facility] Telephone:([phone] [phone]  Patient Care Team: Center, [facility] as PCP - General ([place]) Telford Nab, RN as Registered Nurse   Name of the patient: Philip Weiss  [id number]  10-Aug-1945   Date of visit: 06/09/18 REASON FOR COSULTATION:  Anemia, lung cancer History of presenting illness-  73 y.o. male with PMH listed at below who was sent to ER for evaluation of tachycardia with a heart rate of 160-170s. Found to be in SVT, was started on Cardizem drip, with better controlled heart rate.  Currently on oral cardizem CD 113m BID.  CT chest angio PE protocol showed no evidence of PE, severe multilobar pneumonia.   Patient has stage III T2N3M0, s/p concurrent chemotherapy and radiation, last chemo treatment on 05/17/2018, last RT on 05/23/2018.  Hemoglobin trending down during hospitalization. Hemonc was consulted for evaluation of anemia and lung cancer.  Patient reports feeling fatigued, SOB with exertion, can only walk very short distance. Breathing on nasal cannula oxygen. Poor historian. Denies hematochezia, hematuria, hematemesis, epistaxis, black tarry stool.    Review of Systems  Constitutional: Positive for fatigue. Negative for appetite change, chills and fever.  HENT:   Negative for voice change.   Eyes: Negative for eye problems.  Respiratory: Positive for cough and shortness of breath. Negative for chest tightness.   Cardiovascular: Negative for chest pain and leg swelling.  Gastrointestinal: Negative for abdominal distention and abdominal pain.  Endocrine: Negative for hot flashes.  Genitourinary: Negative for difficulty urinating, dysuria and frequency.   Musculoskeletal: Negative for arthralgias.  Skin: Negative for itching and rash.  Neurological: Negative for light-headedness and numbness.  Hematological: Negative for adenopathy. Does not bruise/bleed easily.    Psychiatric/Behavioral: Negative for confusion.    Allergies  Allergen Reactions  . Aspirin Itching    Patient states may not be allergic, he was taking too many aspirin and became itchy. As long as he takes one tablet daily, he is fine    Patient Active Problem List   Diagnosis Date Noted  . SVT (supraventricular tachycardia) ([facility] 06/07/2018  . Pneumonia 06/07/2018  . Goals of care, counseling/discussion 05/17/2018  . Chemotherapy induced neutropenia ([facility] 05/10/2018  . CVA (cerebral vascular accident) ([facility] 04/07/2018  . Lung cancer ([facility] 04/05/2018  . Mass of middle lobe of right lung 03/29/2018  . Mediastinal mass      Past Medical History:  Diagnosis Date  . CVA (cerebral vascular accident) ([facility]   . Hypertension   . Lung cancer ([facility]      Past Surgical History:  Procedure Laterality Date  . CYST EXCISION Left    buttock  . ENDOBRONCHIAL ULTRASOUND Right 03/29/2018   Procedure: ENDOBRONCHIAL ULTRASOUND;  Surgeon: GTyler Pita MD;  Location: [facility] ORS;  Service: Cardiopulmonary;  Laterality: Right;  . FLEXIBLE BRONCHOSCOPY Right 03/29/2018   Procedure: FLEXIBLE BRONCHOSCOPY;  Surgeon: GTyler Pita MD;  Location: [facility] ORS;  Service: Cardiopulmonary;  Laterality: Right;  . PORTA CATH INSERTION N/A 04/13/2018   Procedure: PORTA CATH INSERTION;  Surgeon: DAlgernon Huxley MD;  [facility];  Service: Cardiovascular;  Laterality: N/A;    Social History   Socioeconomic History  . Marital status: Married    Spouse name: Not on file  . Number of children: 4  . Years of education: Not on file  . Highest education level: Not on file  Occupational History  . Occupation: retired    Comment: mAir traffic controller  Social Needs  . Financial resource strain: Not hard at all  . Food insecurity:    Worry: Never true    Inability: Never true  . Transportation needs:    Medical: No    Non-medical: No  Tobacco Use  . Smoking status: Former  Smoker    Packs/day: 1.00    Years: 58.00    Pack years: 58.00    Types: Cigarettes    Last attempt to quit: 03/30/2018    Years since quitting: 0.1  . Smokeless tobacco: Never Used  Substance and Sexual Activity  . Alcohol use: Not Currently    Comment: daily 32 oz beer  . Drug use: Not Currently  . Sexual activity: Not on file  Lifestyle  . Physical activity:    Days per week: 0 days    Minutes per session: 0 min  . Stress: Only a little  Relationships  . Social connections:    Talks on phone: More than three times a week    Gets together: Twice a week    Attends religious service: More than 4 times per year    Active member of club or organization: Not on file    Attends meetings of clubs or organizations: Not on file    Relationship status: Married  . Intimate partner violence:    Fear of current or ex partner: No    Emotionally abused: No    Physically abused: No    Forced sexual activity: No  Other Topics Concern  . Not on file  Social History Narrative  . Not on file     Family History  Problem Relation Age of Onset  . Diabetes Mother   . CAD Mother   . Stroke Father      Current Facility-Administered Medications:  .  acetaminophen (TYLENOL) tablet 650 mg, 650 mg, Oral, Q6H PRN, 650 mg at 06/08/18 1747 **OR** acetaminophen (TYLENOL) suppository 650 mg, 650 mg, Rectal, Q6H PRN, Wieting, Richard, MD .  aspirin EC tablet 81 mg, 81 mg, Oral, Daily, Loletha Grayer, MD, 81 mg at 06/09/18 2952 .  atorvastatin (LIPITOR) tablet 40 mg, 40 mg, Oral, q1800, Loletha Grayer, MD, 40 mg at 06/09/18 1855 .  [COMPLETED] azithromycin (ZITHROMAX) tablet 500 mg, 500 mg, Oral, Daily, 500 mg at 06/07/18 2224 **FOLLOWED BY** azithromycin (ZITHROMAX) tablet 250 mg, 250 mg, Oral, Daily, Leslye Peer, Richard, MD, 250 mg at 06/09/18 8413 .  bisacodyl (DULCOLAX) EC tablet 5 mg, 5 mg, Oral, Daily PRN, Demetrios Loll, MD .  budesonide (PULMICORT) nebulizer solution 0.5 mg, 0.5 mg,  Nebulization, BID, Leslye Peer, Richard, MD, 0.5 mg at 06/09/18 0710 .  cefTRIAXone (ROCEPHIN) 1 g in sodium chloride 0.9 % 100 mL IVPB, 1 g, Intravenous, Q24H, Loletha Grayer, MD, Stopped at 06/08/18 2221 .  diltiazem (CARDIZEM CD) 24 hr capsule 180 mg, 180 mg, Oral, Daily, Leslye Peer, Richard, MD, 180 mg at 06/09/18 0928 .  enoxaparin (LOVENOX) injection 40 mg, 40 mg, Subcutaneous, Q24H, Wieting, Richard, MD, 40 mg at 06/09/18 1248 .  guaiFENesin-dextromethorphan (ROBITUSSIN DM) 100-10 MG/5ML syrup 5 mL, 5 mL, Oral, Q4H PRN, Demetrios Loll, MD, 5 mL at 06/09/18 [phone] .  ipratropium-albuterol (DUONEB) 0.5-2.5 (3) MG/3ML nebulizer solution 3 mL, 3 mL, Nebulization, BID, Leslye Peer, Richard, MD, 3 mL at 06/09/18 0710 .  MEDLINE mouth rinse, 15 mL, Mouth Rinse, BID, Leslye Peer, Richard, MD, 15 mL at 06/09/18 0933 .  metoprolol tartrate (LOPRESSOR) injection 5 mg, 5 mg, Intravenous, Q1H PRN, Loletha Grayer, MD, 5 mg at 06/07/18  1815 .  mometasone-formoterol (DULERA) 200-5 MCG/ACT inhaler 2 puff, 2 puff, Inhalation, BID, Loletha Grayer, MD, 2 puff at 06/09/18 0932 .  ondansetron (ZOFRAN) tablet 4 mg, 4 mg, Oral, Q6H PRN **OR** ondansetron (ZOFRAN) injection 4 mg, 4 mg, Intravenous, Q6H PRN, Wieting, Richard, MD .  pyridOXINE (VITAMIN B-6) tablet 25 mg, 25 mg, Oral, Daily, Leslye Peer, Richard, MD, 25 mg at 06/09/18 5784 .  senna (SENOKOT) tablet 8.6 mg, 1 tablet, Oral, Daily PRN, Demetrios Loll, MD .  vitamin B-12 (CYANOCOBALAMIN) tablet 100 mcg, 100 mcg, Oral, Daily, Leslye Peer, Richard, MD, 100 mcg at 06/09/18 6962  Facility-Administered Medications Ordered in Other Encounters:  .  sodium chloride flush (NS) 0.9 % injection 10 mL, 10 mL, Intracatheter, PRN, Sindy Guadeloupe, MD   Physical exam:  Vitals:   06/09/18 0033 06/09/18 0330 06/09/18 0720 06/09/18 1621  BP:  114/71 111/73 118/79  Pulse: 91 92 92 (!) 102  Resp:  18    Temp:  98.1 F (36.7 C) (!) 97.3 F (36.3 C) (!) 97.5 F (36.4 C)  TempSrc:   Oral Oral   SpO2: 96% 96% 100% 97%  Weight:  147 lb 1.8 oz (66.7 kg)    Height:       Physical Exam  Constitutional: He is oriented to person, place, and time. No distress.  HENT:  Head: Normocephalic and atraumatic.  Mouth/Throat: No oropharyngeal exudate.  Nasal cannula oxygen  Eyes: Pupils are equal, round, and reactive to light. EOM are normal. No scleral icterus.  Neck: Normal range of motion. Neck supple.  Cardiovascular: Normal rate and regular rhythm.  No murmur heard. Pulmonary/Chest: Effort normal. No respiratory distress.  Decreased breath sounds.   Abdominal: Soft. He exhibits no distension. There is no abdominal tenderness.  Musculoskeletal: Normal range of motion.        General: No edema.  Neurological: He is alert and oriented to person, place, and time.  Skin: Skin is warm and dry. He is not diaphoretic.  Psychiatric: Affect normal.        CMP Latest Ref Rng & Units 06/09/2018  Glucose 70 - 99 mg/dL 158(H)  BUN 8 - 23 mg/dL 16  Creatinine 0.61 - 1.24 mg/dL 0.74  Sodium 135 - 145 mmol/L 130(L)  Potassium 3.5 - 5.1 mmol/L 4.1  Chloride 98 - 111 mmol/L 102  CO2 22 - 32 mmol/L 20(L)  Calcium 8.9 - 10.3 mg/dL 8.0(L)  Total Protein 6.5 - 8.1 g/dL -  Total Bilirubin 0.3 - 1.2 mg/dL -  Alkaline Phos 38 - 126 U/L -  AST 15 - 41 U/L -  ALT 0 - 44 U/L -   CBC Latest Ref Rng & Units 06/09/2018  WBC 4.0 - 10.5 K/uL 7.2  Hemoglobin 13.0 - 17.0 g/dL 7.3(L)  Hematocrit 39.0 - 52.0 % 22.3(L)  Platelets 150 - 400 K/uL 180   RADIOGRAPHIC STUDIES: I have personally reviewed the radiological images as listed and agreed with the findings in the report.   Ct Angio Chest Pe W Or Wo Contrast  Result Date: 06/07/2018 CLINICAL DATA:  73 year old male with history of adenocarcinoma of the lung. Shortness of breath. Supraventricular tachycardia. EXAM: CT ANGIOGRAPHY CHEST WITH CONTRAST TECHNIQUE: Multidetector CT imaging of the chest was performed using the standard protocol during bolus  administration of intravenous contrast. Multiplanar CT image reconstructions and MIPs were obtained to evaluate the vascular anatomy. CONTRAST:  31m OMNIPAQUE IOHEXOL 350 MG/ML SOLN COMPARISON:  PET-CT 03/23/2018.  Chest CT 03/17/2018. FINDINGS: Cardiovascular: No  filling defects within the pulmonary arterial tree to suggest underlying pulmonary embolism. Heart size is mildly enlarged. There is aortic atherosclerosis, as well as atherosclerosis of the great vessels of the mediastinum and the coronary arteries, including calcified atherosclerotic plaque in the left main and left anterior descending coronary arteries. Right internal jugular single-lumen porta cath with tip terminating in the distal superior vena cava. Mediastinum/Nodes: Multiple prominent borderline enlarged and enlarged mediastinal and hilar lymph nodes, largest of which is a subcarinal lymph node measuring up to 2.1 cm in short axis. Esophagus is unremarkable in appearance. Esophagus is unremarkable in appearance. No axillary lymphadenopathy. Lungs/Pleura: Widespread but patchy areas of ground-glass attenuation and septal thickening scattered throughout the lungs bilaterally, significantly increased compared to the prior study from October 2019. Scattered areas of cylindrical and mild varicose bronchiectasis. Some thick-walled cystic changes are noted in the right upper lobe. Volume loss and scarring in the right middle lobe. Trace bilateral pleural effusions. Upper Abdomen: Unremarkable. Musculoskeletal: There are no aggressive appearing lytic or blastic lesions noted in the visualized portions of the skeleton. Review of the MIP images confirms the above findings. IMPRESSION: 1. No evidence of pulmonary embolism. 2. The appearance the chest suggests severe multilobar pneumonia. There also scattered areas of what appear to be post infectious or inflammatory scarring in the lungs bilaterally. 3. Aortic atherosclerosis, in addition to left main and  left anterior descending coronary artery disease. Assessment for potential risk factor modification, dietary therapy or pharmacologic therapy may be warranted, if clinically indicated. Aortic Atherosclerosis (ICD10-I70.0). Electronically Signed   By: Vinnie Langton M.D.   On: 06/07/2018 19:20   Dg Chest Portable 1 View  Result Date: 06/07/2018 CLINICAL DATA:  Two day history of dizziness and weakness. History of lung malignancy and hypertension. Discontinued smoking in October 2019 EXAM: PORTABLE CHEST 1 VIEW COMPARISON:  CT scan of the chest of March 17, 2018 and PET-CT study of March 23, 2018. FINDINGS: The lungs are well-expanded. The interstitial markings are coarse especially in the right infrahilar region. There is no alveolar infiltrate. The heart is top-normal in size. The pulmonary vascularity is not engorged. External pacemaker defibrillator pads are present. A porta catheter is present with the tip projecting over the distal third of the SVC. IMPRESSION: No focal pneumonia. Mild interstitial prominence bilaterally which is likely chronic. Top-normal cardiac size without pulmonary edema. Electronically Signed   By: David  Martinique M.D.   On: 06/07/2018 09:45    Assessment and plan- Patient is a 73 y.o. male with stage III non-small cell lung cancer presented for evaluation of tachycardia and generalized weakness.  #SVT, heart rate better controlled on Cardizem #Acute on chronic anemia, labs reviewed and discussed with patient. Likely multifactorial secondary to bone marrow suppression secondary to concurrent chemo and radiation, and infection. # Symptomatic anemia: Patient is quite symptomatic with generalized weakness and shortness of breath with exertion.  Recommend transfuse 1 unit of PRBC.  # Multilobar pneumonia, CT image was independently reviewed. Will check procalcitonin level.  ? Post radiation pneumonitis. If respiratory status has no improvement with antibiotics treatment,   Consider starting steroids.     Thank you for allowing me to participate in the care of this patient.  Total face to face encounter time for this patient visit was 70 min. >50% of the time was  spent in counseling and coordination of care.    Earlie Server, MD, PhD Hematology Oncology [facility] at [facility] Pager- [phone] 06/09/2018

## 2018-06-09 NOTE — Plan of Care (Signed)
  Problem: Elimination: Goal: Will not experience complications related to urinary retention Outcome: Progressing   Problem: Activity: Goal: Ability to tolerate increased activity will improve Outcome: Progressing   Problem: Respiratory: Goal: Ability to maintain a clear airway will improve Outcome: Progressing

## 2018-06-09 NOTE — Progress Notes (Signed)
SATURATION QUALIFICATIONS: (This note is used to comply with regulatory documentation for home oxygen)  Patient Saturations on Room Air at Rest = 92%  Patient Saturations on Room Air while Ambulating =85%  Patient Saturations on 2 Liters of oxygen while Ambulating = 94%  Please briefly explain why patient needs home oxygen: 

## 2018-06-10 ENCOUNTER — Ambulatory Visit: Payer: Medicare PPO

## 2018-06-10 ENCOUNTER — Other Ambulatory Visit: Payer: Self-pay | Admitting: *Deleted

## 2018-06-10 ENCOUNTER — Telehealth: Payer: Self-pay | Admitting: *Deleted

## 2018-06-10 DIAGNOSIS — C342 Malignant neoplasm of middle lobe, bronchus or lung: Secondary | ICD-10-CM

## 2018-06-10 LAB — SAMPLE TO BLOOD BANK

## 2018-06-10 LAB — PROCALCITONIN: Procalcitonin: 0.4 ng/mL

## 2018-06-10 LAB — HEMOGLOBIN: HEMOGLOBIN: 8.8 g/dL — AB (ref 13.0–17.0)

## 2018-06-10 MED ORDER — PREDNISONE 20 MG PO TABS: 40.0000 mg | ORAL_TABLET | Freq: Every day | ORAL | 0 refills | Status: DC

## 2018-06-10 MED ORDER — AZITHROMYCIN 250 MG PO TABS: 250.0000 mg | ORAL_TABLET | Freq: Every day | ORAL | 0 refills | Status: DC

## 2018-06-10 MED ORDER — GUAIFENESIN-DM 100-10 MG/5ML PO SYRP: 5.0000 mL | ORAL_SOLUTION | ORAL | 0 refills | Status: AC | PRN

## 2018-06-10 MED ORDER — DILTIAZEM HCL ER COATED BEADS 180 MG PO CP24: 180.0000 mg | ORAL_CAPSULE | Freq: Every day | ORAL | 1 refills | Status: DC

## 2018-06-10 MED ORDER — PREDNISONE 50 MG PO TABS: 50.0000 mg | ORAL_TABLET | Freq: Every day | ORAL | Status: DC

## 2018-06-10 MED ORDER — ALBUTEROL SULFATE HFA 108 (90 BASE) MCG/ACT IN AERS: 2.0000 | INHALATION_SPRAY | Freq: Four times a day (QID) | RESPIRATORY_TRACT | 2 refills | Status: AC | PRN

## 2018-06-10 NOTE — Discharge Instructions (Signed)
Follow up oncologist.

## 2018-06-10 NOTE — Care Management Important Message (Signed)
Copy of signed Medicare IM left with patient in room. 

## 2018-06-10 NOTE — Discharge Summary (Signed)
[place] [facility] [place] NAME: Philip Weiss    MR#:  [id number]  DATE OF BIRTH:  10/10/1945  DATE OF ADMISSION:  06/07/2018   ADMITTING PHYSICIAN: Philip Grayer, MD  DATE OF DISCHARGE: 06/10/2018  PRIMARY CARE PHYSICIAN: Philip Weiss, Philip Weiss   ADMISSION DIAGNOSIS:  Hyponatremia [E87.1] Supraventricular tachycardia ([place]) [I47.1] SVT (supraventricular tachycardia) (HCC) [I47.1] DISCHARGE DIAGNOSIS:  Active Problems:   SVT (supraventricular tachycardia) (HCC)   Pneumonia   Acute respiratory failure with hypoxia ([place])  SECONDARY DIAGNOSIS:   Past Medical History:  Diagnosis Date  . CVA (cerebral vascular accident) ([place])   . Hypertension   . Lung cancer [facility])    HOSPITAL COURSE:  Philip Weiss a73 y.o.malewith a known history of adenocarcinoma of the lung.  He is admitted for SVT but found multifocal pneumonia.  Acute respiratory failure due to multifocal pneumonia and possible radiation pneumonitis. Weaned off oxygen by nasal cannula, NEB PRN. He is treated wtih Zithromax and Rocephin.  Robitussin as needed.  Change to Zithromax p.o.  Started prednisone p.o. for 5 days.  Albuterol as needed.  1. SVT. On recent hospitalization the patient had a echo with a normal EF.  He was on Cardizem drip, heart rate is better controlled. Started long-acting Cardizem CD 180 mg daily. PRN short acting Cardizem. 2. Hyponatremia.   Possible related to lung cancer.   He is treated with normal saline IV.  Sodium levels are stable at 130.  3. History of lung cancer. Radiation and chemotherapy to be continued next week.  4. History of CVA on aspirin and cholesterol medication 5. Hyperlipidemia unspecified on cholesterol medication 6. Slightly elevated liver function test.   Stable. 7. Elevated troponin likely secondary to fast heart rate and demand ischemia.  8. Anemia of chronic disease.  Hemoglobin decreased to 7.3,  possible due to IV fluid dilution and bone marrow suppression due to chemotherapy and radiation per Philip Weiss..  No active bleeding.  Anemia work-up is unremarkable.    The patient was given 1 unit PRBC transfusion, hemoglobin increased to 8.8. DISCHARGE CONDITIONS:  Stable, discharge to home today. CONSULTS OBTAINED:  Treatment Team:  Philip Server, MD DRUG ALLERGIES:   Allergies  Allergen Reactions  . Aspirin Itching    Patient states may not be allergic, he was taking too many aspirin and became itchy. As long as he takes one tablet daily, he is fine   DISCHARGE MEDICATIONS:   Allergies as of 06/10/2018      Reactions   Aspirin Itching   Patient states may not be allergic, he was taking too many aspirin and became itchy. As long as he takes one tablet daily, he is fine      Medication List    STOP taking these medications   losartan 25 MG tablet Commonly known as:  COZAAR     TAKE these medications   albuterol 108 (90 Base) MCG/ACT inhaler Commonly known as:  PROVENTIL HFA;VENTOLIN HFA Inhale 2 puffs into the lungs every 6 (six) hours as needed for wheezing or shortness of breath.   aspirin 81 MG EC tablet Take 1 tablet (81 mg total) by mouth daily.   atorvastatin 40 MG tablet Commonly known as:  LIPITOR Take 1 tablet (40 mg total) by mouth daily at 6 PM.   azithromycin 250 MG tablet Commonly known as:  ZITHROMAX Take 1 tablet (250 mg total) by mouth daily.   budesonide-formoterol 160-4.5 MCG/ACT inhaler Commonly known as:  SYMBICORT Inhale 2 puffs into the lungs 2 (two) times daily.   diltiazem 180 MG 24 hr capsule Commonly known as:  CARDIZEM CD Take 1 capsule (180 mg total) by mouth daily. Start taking on:  June 11, 2018   guaiFENesin-dextromethorphan 100-10 MG/5ML syrup Commonly known as:  ROBITUSSIN DM Take 5 mLs by mouth every 4 (four) hours as needed for cough.   MULTIVITAMIN PO Take 1 tablet by mouth daily.   predniSONE 20 MG tablet Commonly known as:   DELTASONE Take 2 tablets (40 mg total) by mouth daily with breakfast.   VITAMIN B-12 PO Take 1 tablet by mouth daily.   VITAMIN B-6 PO Take 1 tablet by mouth daily.        DISCHARGE INSTRUCTIONS:  See AVS. If you experience worsening of your admission symptoms, develop shortness of breath, life threatening emergency, suicidal or homicidal thoughts you must seek medical attention immediately by calling 911 or calling your MD immediately  if symptoms less severe.  You Must read complete instructions/literature along with all the possible adverse reactions/side effects for all the Medicines you take and that have been prescribed to you. Take any new Medicines after you have completely understood and accpet all the possible adverse reactions/side effects.   Please note  You were cared for by a hospitalist during your hospital stay. If you have any questions about your discharge medications or the care you received while you were in the hospital after you are discharged, you can call the unit and asked to speak with the hospitalist on call if the hospitalist that took care of you is not available. Once you are discharged, your primary care physician will handle any further medical issues. Please note that NO REFILLS for any discharge medications will be authorized once you are discharged, as it is imperative that you return to your primary care physician (or establish a relationship with a primary care physician if you do not have one) for your aftercare needs so that they can reassess your need for medications and monitor your lab values.    On the day of Discharge:  VITAL SIGNS:  Blood pressure 122/85, pulse (!) 108, temperature (!) 97.5 F (36.4 C), temperature source Oral, resp. rate 18, height 5\' 9"  (1.753 m), weight 66.7 kg, SpO2 94 %. PHYSICAL EXAMINATION:  GENERAL:  73 y.o.-year-old patient lying in the bed with no acute distress.  EYES: Pupils equal, round, reactive to light and  accommodation. No scleral icterus. Extraocular muscles intact.  HEENT: Head atraumatic, normocephalic. Oropharynx and nasopharynx clear.  NECK:  Supple, no jugular venous distention. No thyroid enlargement, no tenderness.  LUNGS: Normal breath sounds bilaterally, no wheezing, rales,rhonchi or crepitation. No use of accessory muscles of respiration.  CARDIOVASCULAR: S1, S2 normal. No murmurs, rubs, or gallops.  ABDOMEN: Soft, non-tender, non-distended. Bowel sounds present. No organomegaly or mass.  EXTREMITIES: No pedal edema, cyanosis, or clubbing.  NEUROLOGIC: Cranial nerves II through XII are intact. Muscle strength 5/5 in all extremities. Sensation intact. Gait not checked.  PSYCHIATRIC: The patient is alert and oriented x 3.  SKIN: No obvious rash, lesion, or ulcer.  DATA REVIEW:   CBC Recent Labs  Lab 06/09/18 0409 06/10/18 0741  WBC 7.2  --   HGB 7.3* 8.8*  HCT 22.3*  --   PLT 180  --     Chemistries  Recent Labs  Lab 06/08/18 0427 06/09/18 0409  NA 130* 130*  K 4.3 4.1  CL 103 102  CO2  20* 20*  GLUCOSE 189* 158*  BUN 14 16  CREATININE 0.78 0.74  CALCIUM 7.8* 8.0*  MG  --  2.3  AST 50*  --   ALT 46*  --   ALKPHOS 84  --   BILITOT 0.4  --      Microbiology Results  No results found for this or any previous visit.  RADIOLOGY:  No results found.   Management plans discussed with the patient, his wife and they are in agreement.  CODE STATUS: DNR   TOTAL TIME TAKING CARE OF THIS PATIENT: 35 minutes.    Demetrios Loll M.D on 06/10/2018 at 12:18 PM  Between 7am to 6pm - Pager - [phone]  After 6pm go to [email or web address] - [employer] [place]  Office  [phone]  CC: Primary care physician; Philip Weiss, [facility]   Note: This dictation was prepared with Dragon dictation along with smaller phrase technology. Any transcriptional errors that result from this process are unintentional.

## 2018-06-10 NOTE — Telephone Encounter (Signed)
Called and spoke to wife since the patient has been in the hospital and just got out today with pneumonia.  Patient will not need to get chemo on Tuesday of this week.  However because he has been in the hospital we still want him to come on January 7 at 9 AM and have lab work done as well as seeing Dr. Janese Banks.  Patient's wife instructed not to put the EMLA cream on because he will not get his labs through his port because he is not getting treatment.  Wife agreeable to above plan

## 2018-06-10 NOTE — Progress Notes (Signed)
SATURATION QUALIFICATIONS: (This note is used to comply with regulatory documentation for home oxygen)  Patient Saturations on Room Air at Rest = 94%  Patient Saturations on Room Air while Ambulating = 93%  Patient Saturations on 0 Liters of oxygen while Ambulating = 93%  Please briefly explain why patient needs home oxygen:

## 2018-06-13 ENCOUNTER — Ambulatory Visit
Admission: RE | Admit: 2018-06-13 | Discharge: 2018-06-13 | Disposition: A | Discharge status: Home / Self Care | Payer: Medicare PPO | Source: Ambulatory Visit | Attending: Radiation Oncology | Admitting: Radiation Oncology

## 2018-06-13 ENCOUNTER — Telehealth: Payer: Self-pay

## 2018-06-13 DIAGNOSIS — Z8701 Personal history of pneumonia (recurrent): Secondary | ICD-10-CM

## 2018-06-13 DIAGNOSIS — E871 Hypo-osmolality and hyponatremia: Secondary | ICD-10-CM | POA: Diagnosis not present

## 2018-06-13 DIAGNOSIS — Z79899 Other long term (current) drug therapy: Secondary | ICD-10-CM

## 2018-06-13 DIAGNOSIS — J441 Chronic obstructive pulmonary disease with (acute) exacerbation: Secondary | ICD-10-CM | POA: Diagnosis present

## 2018-06-13 DIAGNOSIS — Z8673 Personal history of transient ischemic attack (TIA), and cerebral infarction without residual deficits: Secondary | ICD-10-CM

## 2018-06-13 DIAGNOSIS — Z87891 Personal history of nicotine dependence: Secondary | ICD-10-CM

## 2018-06-13 DIAGNOSIS — C342 Malignant neoplasm of middle lobe, bronchus or lung: Secondary | ICD-10-CM | POA: Diagnosis present

## 2018-06-13 DIAGNOSIS — Z66 Do not resuscitate: Secondary | ICD-10-CM | POA: Diagnosis present

## 2018-06-13 DIAGNOSIS — I471 Supraventricular tachycardia: Secondary | ICD-10-CM | POA: Diagnosis present

## 2018-06-13 DIAGNOSIS — Y95 Nosocomial condition: Secondary | ICD-10-CM | POA: Diagnosis present

## 2018-06-13 DIAGNOSIS — J9601 Acute respiratory failure with hypoxia: Secondary | ICD-10-CM | POA: Diagnosis present

## 2018-06-13 DIAGNOSIS — Z8249 Family history of ischemic heart disease and other diseases of the circulatory system: Secondary | ICD-10-CM

## 2018-06-13 DIAGNOSIS — E785 Hyperlipidemia, unspecified: Secondary | ICD-10-CM | POA: Diagnosis present

## 2018-06-13 DIAGNOSIS — J189 Pneumonia, unspecified organism: Secondary | ICD-10-CM | POA: Diagnosis not present

## 2018-06-13 DIAGNOSIS — I10 Essential (primary) hypertension: Secondary | ICD-10-CM | POA: Diagnosis present

## 2018-06-13 DIAGNOSIS — Z7952 Long term (current) use of systemic steroids: Secondary | ICD-10-CM

## 2018-06-13 DIAGNOSIS — Z923 Personal history of irradiation: Secondary | ICD-10-CM

## 2018-06-13 DIAGNOSIS — Z7951 Long term (current) use of inhaled steroids: Secondary | ICD-10-CM

## 2018-06-13 DIAGNOSIS — Z9221 Personal history of antineoplastic chemotherapy: Secondary | ICD-10-CM

## 2018-06-13 DIAGNOSIS — Z823 Family history of stroke: Secondary | ICD-10-CM

## 2018-06-13 DIAGNOSIS — Z888 Allergy status to other drugs, medicaments and biological substances status: Secondary | ICD-10-CM

## 2018-06-13 DIAGNOSIS — J440 Chronic obstructive pulmonary disease with acute lower respiratory infection: Secondary | ICD-10-CM | POA: Diagnosis present

## 2018-06-13 DIAGNOSIS — Z7982 Long term (current) use of aspirin: Secondary | ICD-10-CM

## 2018-06-13 NOTE — Telephone Encounter (Signed)
Flagged on EMMI report for not reading discharge papers and not having a follow up scheduled.  First attempt to reach patient made, however unable to reach patient.  Left voicemail encouraging callback. Will attempt at later time.

## 2018-06-14 ENCOUNTER — Inpatient Hospital Stay (HOSPITAL_BASED_OUTPATIENT_CLINIC_OR_DEPARTMENT_OTHER): Payer: Medicare PPO | Admitting: Oncology

## 2018-06-14 ENCOUNTER — Encounter: Payer: Self-pay | Admitting: Oncology

## 2018-06-14 ENCOUNTER — Ambulatory Visit: Payer: Medicare PPO

## 2018-06-14 ENCOUNTER — Inpatient Hospital Stay: Payer: Medicare PPO

## 2018-06-14 ENCOUNTER — Ambulatory Visit
Admission: RE | Admit: 2018-06-14 | Discharge: 2018-06-14 | Disposition: A | Discharge status: Home / Self Care | Payer: Medicare PPO | Source: Ambulatory Visit | Attending: Radiation Oncology | Admitting: Radiation Oncology

## 2018-06-14 ENCOUNTER — Other Ambulatory Visit: Payer: Self-pay

## 2018-06-14 ENCOUNTER — Inpatient Hospital Stay: Payer: Medicare PPO | Attending: Oncology

## 2018-06-14 VITALS — BP 113/72 | HR 114 | Resp 20 | Ht 69.0 in | Wt 150.4 lb

## 2018-06-14 DIAGNOSIS — T451X5S Adverse effect of antineoplastic and immunosuppressive drugs, sequela: Secondary | ICD-10-CM | POA: Diagnosis not present

## 2018-06-14 DIAGNOSIS — I700 Atherosclerosis of aorta: Secondary | ICD-10-CM | POA: Diagnosis not present

## 2018-06-14 DIAGNOSIS — I10 Essential (primary) hypertension: Secondary | ICD-10-CM | POA: Insufficient documentation

## 2018-06-14 DIAGNOSIS — R5383 Other fatigue: Secondary | ICD-10-CM

## 2018-06-14 DIAGNOSIS — Z9981 Dependence on supplemental oxygen: Secondary | ICD-10-CM | POA: Insufficient documentation

## 2018-06-14 DIAGNOSIS — Z7982 Long term (current) use of aspirin: Secondary | ICD-10-CM | POA: Insufficient documentation

## 2018-06-14 DIAGNOSIS — C342 Malignant neoplasm of middle lobe, bronchus or lung: Secondary | ICD-10-CM | POA: Diagnosis not present

## 2018-06-14 DIAGNOSIS — Z8701 Personal history of pneumonia (recurrent): Secondary | ICD-10-CM

## 2018-06-14 DIAGNOSIS — E876 Hypokalemia: Secondary | ICD-10-CM | POA: Insufficient documentation

## 2018-06-14 DIAGNOSIS — Z79899 Other long term (current) drug therapy: Secondary | ICD-10-CM | POA: Insufficient documentation

## 2018-06-14 DIAGNOSIS — Z8673 Personal history of transient ischemic attack (TIA), and cerebral infarction without residual deficits: Secondary | ICD-10-CM | POA: Insufficient documentation

## 2018-06-14 DIAGNOSIS — I2510 Atherosclerotic heart disease of native coronary artery without angina pectoris: Secondary | ICD-10-CM | POA: Insufficient documentation

## 2018-06-14 DIAGNOSIS — D6481 Anemia due to antineoplastic chemotherapy: Secondary | ICD-10-CM

## 2018-06-14 DIAGNOSIS — R5381 Other malaise: Secondary | ICD-10-CM | POA: Insufficient documentation

## 2018-06-14 DIAGNOSIS — F17210 Nicotine dependence, cigarettes, uncomplicated: Secondary | ICD-10-CM

## 2018-06-14 DIAGNOSIS — J189 Pneumonia, unspecified organism: Secondary | ICD-10-CM | POA: Diagnosis not present

## 2018-06-14 DIAGNOSIS — T451X5A Adverse effect of antineoplastic and immunosuppressive drugs, initial encounter: Secondary | ICD-10-CM

## 2018-06-14 LAB — CBC WITH DIFFERENTIAL/PLATELET
Abs Immature Granulocytes: 0.08 10*3/uL — ABNORMAL HIGH (ref 0.00–0.07)
Basophils Absolute: 0 10*3/uL (ref 0.0–0.1)
Basophils Relative: 0 %
Eosinophils Absolute: 0.2 10*3/uL (ref 0.0–0.5)
Eosinophils Relative: 3 %
HCT: 28.6 % — ABNORMAL LOW (ref 39.0–52.0)
Hemoglobin: 9.3 g/dL — ABNORMAL LOW (ref 13.0–17.0)
Immature Granulocytes: 2 %
LYMPHS ABS: 0.7 10*3/uL (ref 0.7–4.0)
LYMPHS PCT: 12 %
MCH: 30.5 pg (ref 26.0–34.0)
MCHC: 32.5 g/dL (ref 30.0–36.0)
MCV: 93.8 fL (ref 80.0–100.0)
MONO ABS: 0.6 10*3/uL (ref 0.1–1.0)
Monocytes Relative: 12 %
Neutro Abs: 3.8 10*3/uL (ref 1.7–7.7)
Neutrophils Relative %: 71 %
Platelets: 195 10*3/uL (ref 150–400)
RBC: 3.05 MIL/uL — ABNORMAL LOW (ref 4.22–5.81)
RDW: 18.1 % — ABNORMAL HIGH (ref 11.5–15.5)
WBC: 5.4 10*3/uL (ref 4.0–10.5)
nRBC: 0 % (ref 0.0–0.2)

## 2018-06-14 LAB — COMPREHENSIVE METABOLIC PANEL
ALT: 58 U/L — ABNORMAL HIGH (ref 0–44)
AST: 34 U/L (ref 15–41)
Albumin: 2.5 g/dL — ABNORMAL LOW (ref 3.5–5.0)
Alkaline Phosphatase: 82 U/L (ref 38–126)
Anion gap: 9 (ref 5–15)
BUN: 7 mg/dL — ABNORMAL LOW (ref 8–23)
CHLORIDE: 100 mmol/L (ref 98–111)
CO2: 25 mmol/L (ref 22–32)
Calcium: 8.5 mg/dL — ABNORMAL LOW (ref 8.9–10.3)
Creatinine, Ser: 0.66 mg/dL (ref 0.61–1.24)
GFR calc Af Amer: >60 mL/min (ref >60)
GFR calc non Af Amer: >60 mL/min (ref >60)
Glucose, Bld: 126 mg/dL — ABNORMAL HIGH (ref 70–99)
POTASSIUM: 3 mmol/L — AB (ref 3.5–5.1)
SODIUM: 134 mmol/L — AB (ref 135–145)
Total Bilirubin: 0.5 mg/dL (ref 0.3–1.2)
Total Protein: 6.3 g/dL — ABNORMAL LOW (ref 6.5–8.1)

## 2018-06-14 MED ORDER — HEPARIN SOD (PORK) LOCK FLUSH 100 UNIT/ML IV SOLN
500.0000 [IU] | Freq: Once | INTRAVENOUS | Status: AC
  Administered 2018-06-14: 500 [IU] via INTRAVENOUS

## 2018-06-14 MED ORDER — SODIUM CHLORIDE 0.9% FLUSH
10.0000 mL | INTRAVENOUS | Status: AC | PRN
  Administered 2018-06-14: 10 mL via INTRAVENOUS
  Filled 2018-06-14: qty 10

## 2018-06-14 MED ORDER — SODIUM CHLORIDE 0.9 % IV SOLN
Freq: Once | INTRAVENOUS | Status: AC
  Administered 2018-06-14: 11:00:00 via INTRAVENOUS
  Filled 2018-06-14: qty 250

## 2018-06-14 MED ORDER — SODIUM CHLORIDE 0.9 % IV SOLN
INTRAVENOUS | Status: DC
  Administered 2018-06-14: 11:00:00 via INTRAVENOUS
  Filled 2018-06-14: qty 250

## 2018-06-14 NOTE — Progress Notes (Signed)
Okay to give 40 kcl over 2 hours through patient's port per Dr Janese Banks.

## 2018-06-14 NOTE — Telephone Encounter (Signed)
Second attempt made, however unable to reach patient.  Left another voicemail encouraging callback for any questions or concerns.  No further attempts at this time.

## 2018-06-14 NOTE — Progress Notes (Signed)
Patient  Here for follow up. He has recently been hospitalized for low blood pressure and was diagnosed with pneumonia. He reports a poor appetite, he has an occasional cough which is productive.

## 2018-06-15 ENCOUNTER — Emergency Department: Payer: Medicare PPO

## 2018-06-15 ENCOUNTER — Encounter: Payer: Self-pay | Admitting: Oncology

## 2018-06-15 ENCOUNTER — Encounter: Payer: Self-pay | Admitting: *Deleted

## 2018-06-15 ENCOUNTER — Inpatient Hospital Stay
Admission: EM | Admit: 2018-06-15 | Discharge: 2018-06-19 | DRG: 193 | Disposition: A | Discharge status: Home Health | Payer: Medicare PPO | Attending: Internal Medicine | Admitting: Internal Medicine

## 2018-06-15 ENCOUNTER — Other Ambulatory Visit: Payer: Self-pay

## 2018-06-15 ENCOUNTER — Ambulatory Visit
Admission: RE | Admit: 2018-06-15 | Discharge: 2018-06-15 | Disposition: A | Discharge status: Home / Self Care | Payer: Medicare PPO | Source: Ambulatory Visit | Attending: Radiation Oncology | Admitting: Radiation Oncology

## 2018-06-15 DIAGNOSIS — Y95 Nosocomial condition: Secondary | ICD-10-CM | POA: Diagnosis present

## 2018-06-15 DIAGNOSIS — E785 Hyperlipidemia, unspecified: Secondary | ICD-10-CM | POA: Diagnosis present

## 2018-06-15 DIAGNOSIS — Z87891 Personal history of nicotine dependence: Secondary | ICD-10-CM | POA: Diagnosis not present

## 2018-06-15 DIAGNOSIS — A419 Sepsis, unspecified organism: Secondary | ICD-10-CM | POA: Diagnosis present

## 2018-06-15 DIAGNOSIS — Z9221 Personal history of antineoplastic chemotherapy: Secondary | ICD-10-CM | POA: Diagnosis not present

## 2018-06-15 DIAGNOSIS — Z8249 Family history of ischemic heart disease and other diseases of the circulatory system: Secondary | ICD-10-CM | POA: Diagnosis not present

## 2018-06-15 DIAGNOSIS — J189 Pneumonia, unspecified organism: Secondary | ICD-10-CM

## 2018-06-15 DIAGNOSIS — C342 Malignant neoplasm of middle lobe, bronchus or lung: Secondary | ICD-10-CM | POA: Diagnosis present

## 2018-06-15 DIAGNOSIS — J9601 Acute respiratory failure with hypoxia: Secondary | ICD-10-CM | POA: Diagnosis present

## 2018-06-15 DIAGNOSIS — C3490 Malignant neoplasm of unspecified part of unspecified bronchus or lung: Secondary | ICD-10-CM | POA: Diagnosis present

## 2018-06-15 DIAGNOSIS — Z7982 Long term (current) use of aspirin: Secondary | ICD-10-CM | POA: Diagnosis not present

## 2018-06-15 DIAGNOSIS — Z888 Allergy status to other drugs, medicaments and biological substances status: Secondary | ICD-10-CM | POA: Diagnosis not present

## 2018-06-15 DIAGNOSIS — J440 Chronic obstructive pulmonary disease with acute lower respiratory infection: Secondary | ICD-10-CM | POA: Diagnosis present

## 2018-06-15 DIAGNOSIS — Z8701 Personal history of pneumonia (recurrent): Secondary | ICD-10-CM | POA: Diagnosis not present

## 2018-06-15 DIAGNOSIS — Z823 Family history of stroke: Secondary | ICD-10-CM | POA: Diagnosis not present

## 2018-06-15 DIAGNOSIS — J441 Chronic obstructive pulmonary disease with (acute) exacerbation: Secondary | ICD-10-CM | POA: Diagnosis present

## 2018-06-15 DIAGNOSIS — D649 Anemia, unspecified: Secondary | ICD-10-CM | POA: Diagnosis not present

## 2018-06-15 DIAGNOSIS — E871 Hypo-osmolality and hyponatremia: Secondary | ICD-10-CM | POA: Diagnosis present

## 2018-06-15 DIAGNOSIS — Z66 Do not resuscitate: Secondary | ICD-10-CM | POA: Diagnosis present

## 2018-06-15 DIAGNOSIS — Z8673 Personal history of transient ischemic attack (TIA), and cerebral infarction without residual deficits: Secondary | ICD-10-CM | POA: Diagnosis not present

## 2018-06-15 DIAGNOSIS — Z7951 Long term (current) use of inhaled steroids: Secondary | ICD-10-CM | POA: Diagnosis not present

## 2018-06-15 DIAGNOSIS — Z7952 Long term (current) use of systemic steroids: Secondary | ICD-10-CM | POA: Diagnosis not present

## 2018-06-15 DIAGNOSIS — Z79899 Other long term (current) drug therapy: Secondary | ICD-10-CM | POA: Diagnosis not present

## 2018-06-15 DIAGNOSIS — I10 Essential (primary) hypertension: Secondary | ICD-10-CM | POA: Diagnosis present

## 2018-06-15 DIAGNOSIS — I471 Supraventricular tachycardia: Secondary | ICD-10-CM | POA: Diagnosis present

## 2018-06-15 DIAGNOSIS — Z923 Personal history of irradiation: Secondary | ICD-10-CM | POA: Diagnosis not present

## 2018-06-15 LAB — CBC WITH DIFFERENTIAL/PLATELET
Abs Immature Granulocytes: 0.09 10*3/uL — ABNORMAL HIGH (ref 0.00–0.07)
Basophils Absolute: 0 10*3/uL (ref 0.0–0.1)
Basophils Relative: 0 %
Eosinophils Absolute: 0.2 10*3/uL (ref 0.0–0.5)
Eosinophils Relative: 2 %
HEMATOCRIT: 29.7 % — AB (ref 39.0–52.0)
Hemoglobin: 9.7 g/dL — ABNORMAL LOW (ref 13.0–17.0)
Immature Granulocytes: 1 %
LYMPHS PCT: 17 %
Lymphs Abs: 1.1 10*3/uL (ref 0.7–4.0)
MCH: 31.1 pg (ref 26.0–34.0)
MCHC: 32.7 g/dL (ref 30.0–36.0)
MCV: 95.2 fL (ref 80.0–100.0)
MONOS PCT: 12 %
Monocytes Absolute: 0.8 10*3/uL (ref 0.1–1.0)
Neutro Abs: 4.4 10*3/uL (ref 1.7–7.7)
Neutrophils Relative %: 68 %
Platelets: 207 10*3/uL (ref 150–400)
RBC: 3.12 MIL/uL — ABNORMAL LOW (ref 4.22–5.81)
RDW: 18.4 % — ABNORMAL HIGH (ref 11.5–15.5)
WBC: 6.6 10*3/uL (ref 4.0–10.5)
nRBC: 0 % (ref 0.0–0.2)

## 2018-06-15 LAB — URINALYSIS, COMPLETE (UACMP) WITH MICROSCOPIC
Bacteria, UA: NONE SEEN
Bilirubin Urine: NEGATIVE
Glucose, UA: NEGATIVE mg/dL
KETONES UR: NEGATIVE mg/dL
Leukocytes, UA: NEGATIVE
Nitrite: NEGATIVE
Protein, ur: NEGATIVE mg/dL
Specific Gravity, Urine: 1.003 — ABNORMAL LOW (ref 1.005–1.030)
Squamous Epithelial / HPF: NONE SEEN (ref 0–5)
pH: 7 (ref 5.0–8.0)

## 2018-06-15 LAB — INFLUENZA PANEL BY PCR (TYPE A & B)
Influenza A By PCR: NEGATIVE
Influenza B By PCR: NEGATIVE

## 2018-06-15 LAB — BASIC METABOLIC PANEL
Anion gap: 10 (ref 5–15)
BUN: 9 mg/dL (ref 8–23)
CO2: 23 mmol/L (ref 22–32)
Calcium: 8.3 mg/dL — ABNORMAL LOW (ref 8.9–10.3)
Chloride: 94 mmol/L — ABNORMAL LOW (ref 98–111)
Creatinine, Ser: 0.72 mg/dL (ref 0.61–1.24)
GFR calc Af Amer: >60 mL/min (ref >60)
GFR calc non Af Amer: >60 mL/min (ref >60)
GLUCOSE: 115 mg/dL — AB (ref 70–99)
Potassium: 3.1 mmol/L — ABNORMAL LOW (ref 3.5–5.1)
Sodium: 127 mmol/L — ABNORMAL LOW (ref 135–145)

## 2018-06-15 LAB — CG4 I-STAT (LACTIC ACID): Lactic Acid, Venous: 1.41 mmol/L (ref 0.5–1.9)

## 2018-06-15 LAB — TROPONIN I: Troponin I: 0.03 ng/mL (ref <0.03)

## 2018-06-15 LAB — BRAIN NATRIURETIC PEPTIDE: B Natriuretic Peptide: 51 pg/mL (ref 0.0–100.0)

## 2018-06-15 MED ORDER — VANCOMYCIN HCL IN DEXTROSE 1-5 GM/200ML-% IV SOLN
1000.0000 mg | Freq: Once | INTRAVENOUS | Status: DC
  Filled 2018-06-15: qty 200

## 2018-06-15 MED ORDER — SODIUM CHLORIDE 0.9 % IV BOLUS
1000.0000 mL | Freq: Once | INTRAVENOUS | Status: AC
  Administered 2018-06-15: 1000 mL via INTRAVENOUS

## 2018-06-15 MED ORDER — VANCOMYCIN HCL 10 G IV SOLR
1250.0000 mg | Freq: Once | INTRAVENOUS | Status: AC
  Administered 2018-06-16: 04:00:00 1250 mg via INTRAVENOUS
  Filled 2018-06-15: qty 1250

## 2018-06-15 MED ORDER — SODIUM CHLORIDE 0.9 % IV SOLN
2.0000 g | Freq: Three times a day (TID) | INTRAVENOUS | Status: DC
  Administered 2018-06-16 – 2018-06-19 (×10): 2 g via INTRAVENOUS
  Filled 2018-06-15 (×12): qty 2

## 2018-06-15 MED ORDER — SODIUM CHLORIDE 0.9 % IV SOLN
2.0000 g | Freq: Once | INTRAVENOUS | Status: AC
  Administered 2018-06-15: 2 g via INTRAVENOUS
  Filled 2018-06-15: qty 2

## 2018-06-15 NOTE — H&P (Signed)
[facility] NAME: Philip Weiss    MR#:  [id number]  DATE OF BIRTH:  [birth year]  DATE OF ADMISSION:  06/15/2018  PRIMARY CARE PHYSICIAN: [facility]   REQUESTING/REFERRING PHYSICIAN: Cherylann Banas, MD  CHIEF COMPLAINT:   Chief Complaint  Patient presents with  . Shortness of Breath    HISTORY OF PRESENT ILLNESS:  Philip Weiss  is a 73 y.o. male who presents with chief complaint as above.  Patient presents with 2 days of increasing shortness of breath and cough.  He was just discharged from our hospital within the last week after being treated for SVT and multifocal pneumonia.  He went for radiation treatment of his lung cancer today and was noted to be hypoxic with O2 sats down into the mid 80s.  He came to the hospital for evaluation is found here to have persistent pneumonia, and meet sepsis criteria.  Hospitalist called for admission  PAST MEDICAL HISTORY:   Past Medical History:  Diagnosis Date  . CVA (cerebral vascular accident) ([place])   . Hypertension   . Lung cancer ([place])      PAST SURGICAL HISTORY:   Past Surgical History:  Procedure Laterality Date  . CYST EXCISION Left    buttock  . ENDOBRONCHIAL ULTRASOUND Right 03/29/2018   Procedure: ENDOBRONCHIAL ULTRASOUND;  Surgeon: Tyler Pita, MD;  Location: [facility];  Service: Cardiopulmonary;  Laterality: Right;  . FLEXIBLE BRONCHOSCOPY Right 03/29/2018   Procedure: FLEXIBLE BRONCHOSCOPY;  Surgeon: Tyler Pita, MD;  Location: [facility];  Service: Cardiopulmonary;  Laterality: Right;  . PORTA CATH INSERTION N/A 04/13/2018   Procedure: PORTA CATH INSERTION;  Surgeon: Algernon Huxley, MD;  Location: [facility];  Service: Cardiovascular;  Laterality: N/A;     SOCIAL HISTORY:   Social History   Tobacco Use  . Smoking status: Former Smoker    Packs/day: 1.00    Years: 58.00    Pack years: 58.00    Types: Cigarettes    Last attempt  to quit: 03/30/2018    Years since quitting: 0.2  . Smokeless tobacco: Never Used  Substance Use Topics  . Alcohol use: Not Currently    Comment: daily 32 oz beer     FAMILY HISTORY:   Family History  Problem Relation Age of Onset  . Diabetes Mother   . CAD Mother   . Stroke Father      DRUG ALLERGIES:   Allergies  Allergen Reactions  . Aspirin Itching    Patient states may not be allergic, he was taking too many aspirin and became itchy. As long as he takes one tablet daily, he is fine    MEDICATIONS AT HOME:   Prior to Admission medications   Medication Sig Start Date End Date Taking? Authorizing Provider  albuterol (PROVENTIL HFA;VENTOLIN HFA) 108 (90 Base) MCG/ACT inhaler Inhale 2 puffs into the lungs every 6 (six) hours as needed for wheezing or shortness of breath. 06/10/18   Demetrios Loll, MD  aspirin EC 81 MG EC tablet Take 1 tablet (81 mg total) by mouth daily. 04/09/18   Fritzi Mandes, MD  atorvastatin (LIPITOR) 40 MG tablet Take 1 tablet (40 mg total) by mouth daily at 6 PM. 04/08/18   Fritzi Mandes, MD  budesonide-formoterol [facility]) 160-4.5 MCG/ACT inhaler Inhale 2 puffs into the lungs 2 (two) times daily.    [provider]  Cyanocobalamin (VITAMIN B-12 PO) Take 1 tablet by mouth  daily.    [provider]  diltiazem (CARDIZEM CD) 180 MG 24 hr capsule Take 1 capsule (180 mg total) by mouth daily. 06/11/18   Demetrios Loll, MD  guaiFENesin-dextromethorphan [facility] DM) 100-10 MG/5ML syrup Take 5 mLs by mouth every 4 (four) hours as needed for cough. 06/10/18   Demetrios Loll, MD  Multiple Vitamins-Minerals (MULTIVITAMIN PO) Take 1 tablet by mouth daily.    [provider]  predniSONE (DELTASONE) 20 MG tablet Take 2 tablets (40 mg total) by mouth daily with breakfast. 06/10/18   Demetrios Loll, MD  Pyridoxine HCl (VITAMIN B-6 PO) Take 1 tablet by mouth daily.    [provider]    REVIEW OF SYSTEMS:  Review of Systems  Constitutional: Positive for  malaise/fatigue. Negative for chills, fever and weight loss.  HENT: Negative for ear pain, hearing loss and tinnitus.   Eyes: Negative for blurred vision, double vision, pain and redness.  Respiratory: Positive for cough and shortness of breath. Negative for hemoptysis.   Cardiovascular: Negative for chest pain, palpitations, orthopnea and leg swelling.  Gastrointestinal: Negative for abdominal pain, constipation, diarrhea, nausea and vomiting.  Genitourinary: Negative for dysuria, frequency and hematuria.  Musculoskeletal: Negative for back pain, joint pain and neck pain.  Skin:       No acne, rash, or lesions  Neurological: Negative for dizziness, tremors, focal weakness and weakness.  Endo/Heme/Allergies: Negative for polydipsia. Does not bruise/bleed easily.  Psychiatric/Behavioral: Negative for depression. The patient is not nervous/anxious and does not have insomnia.      VITAL SIGNS:   Vitals:   06/15/18 2145 06/15/18 2200 06/15/18 2230 06/15/18 2330  BP:  133/76 136/82 133/86  Pulse: (!) 110 (!) 109 (!) 107 (!) 109  Resp: (!) 26 (!) 28 (!) 21 (!) 26  Temp:      TempSrc:      SpO2: 96% 98% 96% 98%   Wt Readings from Last 3 Encounters:  06/14/18 68.2 kg  06/09/18 66.7 kg  06/07/18 (P) 67.1 kg    PHYSICAL EXAMINATION:  Physical Exam  Vitals reviewed. Constitutional: He is oriented to person, place, and time. He appears well-developed and well-nourished. No distress.  HENT:  Head: Normocephalic and atraumatic.  Mouth/Throat: Oropharynx is clear and moist.  Eyes: Pupils are equal, round, and reactive to light. Conjunctivae and EOM are normal. No scleral icterus.  Neck: Normal range of motion. Neck supple. No JVD present. No thyromegaly present.  Cardiovascular: Normal rate, regular rhythm and intact distal pulses. Exam reveals no gallop and no friction rub.  No murmur heard. Respiratory: Effort normal. No respiratory distress. He has no wheezes. He has no rales.   Bilateral coarse breath sounds  GI: Soft. Bowel sounds are normal. He exhibits no distension. There is no abdominal tenderness.  Musculoskeletal: Normal range of motion.        General: No edema.     Comments: No arthritis, no gout  Lymphadenopathy:    He has no cervical adenopathy.  Neurological: He is alert and oriented to person, place, and time. No cranial nerve deficit.  No dysarthria, no aphasia  Skin: Skin is warm and dry. No rash noted. No erythema.  Psychiatric: He has a normal mood and affect. His behavior is normal. Judgment and thought content normal.    LABORATORY PANEL:   CBC Recent Labs  Lab 06/15/18 2109  WBC 6.6  HGB 9.7*  HCT 29.7*  PLT 207   ------------------------------------------------------------------------------------------------------------------  Chemistries  Recent Labs  Lab 06/09/18  6203 06/14/18 0903 06/15/18 2109  NA 130* 134* 127*  K 4.1 3.0* 3.1*  CL 102 100 94*  CO2 20* 25 23  GLUCOSE 158* 126* 115*  BUN 16 7* 9  CREATININE 0.74 0.66 0.72  CALCIUM 8.0* 8.5* 8.3*  MG 2.3  --   --   AST  --  34  --   ALT  --  58*  --   ALKPHOS  --  82  --   BILITOT  --  0.5  --    ------------------------------------------------------------------------------------------------------------------  Cardiac Enzymes Recent Labs  Lab 06/15/18 2109  TROPONINI 0.03*   ------------------------------------------------------------------------------------------------------------------  RADIOLOGY:  Dg Chest Portable 1 View  Result Date: 06/15/2018 CLINICAL DATA:  Shortness of breath. EXAM: PORTABLE CHEST 1 VIEW COMPARISON:  Radiographs and CT 06/07/2018 FINDINGS: Right chest port in the mid SVC. Unchanged heart size and mediastinal contours. Mild chronic volume loss in the right lung. Diffuse interstitial opacities corresponding to ground-glass opacities on CT. Slight improvement in the left lung from prior exam. Small pleural effusions on CT are not seen  radiographically. No pneumothorax. Unchanged osseous structures. IMPRESSION: 1. Diffuse lung opacities with slight improvement in the left lung from prior exam. Unchanged appearance of the right hemithorax. CT findings suspicious for pneumonia. 2. No new abnormalities. Electronically Signed   By: Keith Rake M.D.   On: 06/15/2018 21:32    EKG:   Orders placed or performed during the hospital encounter of 06/15/18  . ED EKG  . ED EKG    IMPRESSION AND PLAN:  Principal Problem:   Acute respiratory failure with hypoxia (HCC) -patient requiring oxygen here though he does not normally use it at home, O2 sats were significantly lower during his radiation treatment and with EMS, in the low to mid 80s.  This is due to his pneumonia, see below for treatment Active Problems:   HCAP (healthcare-associated pneumonia) -IV antibiotics initiated, supportive treatment with duo nebs and PRN antitussive   Sepsis ([place]) -lactic acid was within normal limits, blood pressure stable, IV antibiotics initiated, cultures ordered   Lung cancer [facility]) -oncology consult per family request   HTN (hypertension) -stable, continue home meds  Chart review performed and case discussed with ED provider. Labs, imaging and/or ECG reviewed by provider and discussed with patient/family. Management plans discussed with the patient and/or family.  DVT PROPHYLAXIS: SubQ lovenox   GI PROPHYLAXIS:  None  ADMISSION STATUS: Inpatient     CODE STATUS: DNR Code Status History    Date Active Date Inactive Code Status Order ID Comments User Context   06/07/2018 1236 06/10/2018 1822 DNR [id number]  Loletha Grayer, MD ED   04/07/2018 1647 04/08/2018 2059 DNR [id number]  Loletha Grayer, MD ED    Questions for Most Recent Historical Code Status (Order [id number])    Question Answer Comment   In the event of cardiac or respiratory ARREST Do not call a "code blue"    In the event of cardiac or respiratory ARREST Do not perform  Intubation, CPR, defibrillation or ACLS    In the event of cardiac or respiratory ARREST Use medication by any route, position, wound care, and other measures to relive pain and suffering. May use oxygen, suction and manual treatment of airway obstruction as needed for comfort.    Comments nurse may pronounce       TOTAL TIME TAKING CARE OF THIS PATIENT: 45 minutes.   Delbert Darley Kirkpatrick 06/15/2018, 11:52 PM  [employer]  [phone]  CC: Primary care physician; [facility]  Note:  This document was prepared using Systems analyst and may include unintentional dictation errors.

## 2018-06-15 NOTE — ED Triage Notes (Signed)
Patient from home with c/o of increased sob. Patient report sob began when after chemo therapy today. Patient receiving chemo for right lung Ca. Sating 92% on 2lnc. Able to speak full sentences.

## 2018-06-15 NOTE — Progress Notes (Signed)
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 He underwent lung cancer screening low-dose CT scan which showed a right middle lobe lung mass of about 4.8 cm. Subcarinal adenopathy 2.8 x 4 cm and probable right hilar adenopathy and soft tissue fullness 2.3 cm. This was followed by a PET CT scan which showed right middle lobe mass was hypermetabolic at 03.1 with hypermetabolic right hilar and infrahilar adenopathy. Hypermetabolic 2.5 cm subcarinal node and right paratracheal nodes. No hypermetabolic contralateral adenopathy. Left upper lobe 4 mm solitary pulmonary nodule below PET resolution. No evidence of hypermetabolic them elsewhere.  Biopsy of the right middle lobe showed non-small cell carcinoma favor adenocarcinoma. 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 However it showed acute cerebellar stroke for which patient was admitted to the hospital and underwent a work-up including carotid Dopplers as well as CTA of the head and neck and echo with bubble study which were unremarkable  Patient received 4 weeks of concurrent carbotaxol chemotherapy and radiation which she tolerated relatively well.  He then had a break of 4 weeks before re-starting radiation treatment.   Patient was hospitalized in the interim for worsening shortness of breath and was found to have multi lobar pneumonia treated with antibiotics  Interval history-patient feels better since his hospital discharge.  However he does not feel that his breathing is back to his baseline.  He does report exertional shortness of breath.  He is currently not on home oxygen.  Denies any fevers or productive cough.  He completed his antibiotic course yesterday.  ECOG PS- 2 Pain scale- 0 Opioid associated constipation- no  Review of systems- Review of Systems  Constitutional: Positive for malaise/fatigue. Negative for chills, fever and weight loss.  HENT: Negative for congestion, ear discharge and nosebleeds.   Eyes: Negative for blurred vision.  Respiratory: Positive for shortness of breath. Negative for cough, hemoptysis, sputum production and wheezing.   Cardiovascular: Negative for chest pain, palpitations, orthopnea and claudication.  Gastrointestinal: Negative for abdominal pain, blood in stool, constipation, diarrhea, heartburn, melena, nausea and vomiting.  Genitourinary: Negative for dysuria, flank pain, frequency, hematuria and urgency.  Musculoskeletal: Negative for back pain, joint pain and myalgias.  Skin: Negative for rash.  Neurological: Negative for dizziness, tingling, focal weakness, seizures, weakness and headaches.  Endo/Heme/Allergies: Does not bruise/bleed easily.  Psychiatric/Behavioral: Negative for depression and suicidal ideas. The patient does not have insomnia.       Allergies  Allergen Reactions  . Aspirin Itching    Patient states may not be allergic, he was taking too many aspirin and became itchy. As long as he takes one tablet daily, he is fine     Past Medical History:  Diagnosis Date  . CVA (cerebral vascular accident) ([place])   . Hypertension   . Lung cancer ([place])  Past Surgical History:  Procedure Laterality Date  . CYST EXCISION Left    buttock  .  ENDOBRONCHIAL ULTRASOUND Right 03/29/2018   Procedure: ENDOBRONCHIAL ULTRASOUND;  Surgeon: Tyler Pita, MD;  Location: [facility] ORS;  Service: Cardiopulmonary;  Laterality: Right;  . FLEXIBLE BRONCHOSCOPY Right 03/29/2018   Procedure: FLEXIBLE BRONCHOSCOPY;  Surgeon: Tyler Pita, MD;  Location: [facility] ORS;  Service: Cardiopulmonary;  Laterality: Right;  . PORTA CATH INSERTION N/A 04/13/2018   Procedure: PORTA CATH INSERTION;  Surgeon: Algernon Huxley, MD;  Location: [place] [facility];  Service: Cardiovascular;  Laterality: N/A;    Social History   Socioeconomic History  . Marital status: Married    Spouse name: Not on file  . Number of children: 4  . Years of education: Not on file  . Highest education level: Not on file  Occupational History  . Occupation: retired    Comment: mill/construction   Social Needs  . Financial resource strain: Not hard at all  . Food insecurity:    Worry: Never true    Inability: Never true  . Transportation needs:    Medical: No    Non-medical: No  Tobacco Use  . Smoking status: Former Smoker    Packs/day: 1.00    Years: 58.00    Pack years: 58.00    Types: Cigarettes    Last attempt to quit: 03/30/2018    Years since quitting: 0.2  . Smokeless tobacco: Never Used  Substance and Sexual Activity  . Alcohol use: Not Currently    Comment: daily 32 oz beer  . Drug use: Not Currently  . Sexual activity: Not on file  Lifestyle  . Physical activity:    Days per week: 0 days    Minutes per session: 0 min  . Stress: Only a little  Relationships  . Social connections:    Talks on phone: More than three times a week    Gets together: Twice a week    Attends religious service: More than 4 times per year    Active member of club or organization: Not on file    Attends meetings of clubs or organizations: Not on file    Relationship status: Married  . Intimate partner violence:    Fear of current or ex partner: No    Emotionally abused: No     Physically abused: No    Forced sexual activity: No  Other Topics Concern  . Not on file  Social History Narrative  . Not on file    Family History  Problem Relation Age of Onset  . Diabetes Mother   . CAD Mother   . Stroke Father      Current Outpatient Medications:  .  albuterol (PROVENTIL HFA;VENTOLIN HFA) 108 (90 Base) MCG/ACT inhaler, Inhale 2 puffs into the lungs every 6 (six) hours as needed for wheezing or shortness of breath., Disp: 1 Inhaler, Rfl: 2 .  aspirin EC 81 MG EC tablet, Take 1 tablet (81 mg total) by mouth daily., Disp: 30 tablet, Rfl: 1 .  atorvastatin (LIPITOR) 40 MG tablet, Take 1 tablet (40 mg total) by mouth daily at 6 PM., Disp: 30 tablet, Rfl: 1 .  budesonide-formoterol (SYMBICORT) 160-4.5 MCG/ACT inhaler, Inhale 2 puffs into the lungs 2 (two) times daily., Disp: , Rfl:  .  Cyanocobalamin (VITAMIN B-12 PO), Take 1 tablet by mouth daily., Disp: , Rfl:  .  diltiazem (CARDIZEM CD) 180 MG 24 hr capsule, Take 1 capsule (180 mg total)  by mouth daily., Disp: 30 capsule, Rfl: 1 .  guaiFENesin-dextromethorphan (ROBITUSSIN DM) 100-10 MG/5ML syrup, Take 5 mLs by mouth every 4 (four) hours as needed for cough., Disp: 118 mL, Rfl: 0 .  Multiple Vitamins-Minerals (MULTIVITAMIN PO), Take 1 tablet by mouth daily., Disp: , Rfl:  .  predniSONE (DELTASONE) 20 MG tablet, Take 2 tablets (40 mg total) by mouth daily with breakfast., Disp: 4 tablet, Rfl: 0 .  Pyridoxine HCl (VITAMIN B-6 PO), Take 1 tablet by mouth daily., Disp: , Rfl:  No current facility-administered medications for this visit.   Facility-Administered Medications Ordered in Other Visits:  .  sodium chloride flush (NS) 0.9 % injection 10 mL, 10 mL, Intracatheter, PRN, Sindy Guadeloupe, MD .  sodium chloride flush (NS) 0.9 % injection 10 mL, 10 mL, Intravenous, PRN, Sindy Guadeloupe, MD, 10 mL at 06/14/18 0907  Physical exam:  Vitals:   06/14/18 0926 06/14/18 0936  BP:  113/72  Pulse:  (!) 114  Resp: 20     SpO2:  93%  Weight: 150 lb 6.4 oz (68.2 kg)   Height: _0  (1.753 m)    Physical Exam Constitutional:      Comments: Thin frail elderly gentleman in no acute distress  HENT:     Head: Normocephalic and atraumatic.  Eyes:     Pupils: Pupils are equal, round, and reactive to light.  Neck:     Musculoskeletal: Normal range of motion.  Cardiovascular:     Rate and Rhythm: Regular rhythm. Tachycardia present.     Heart sounds: Normal heart sounds.  Pulmonary:     Effort: Pulmonary effort is normal.     Breath sounds: Normal breath sounds.  Abdominal:     General: Bowel sounds are normal.     Palpations: Abdomen is soft.  Musculoskeletal:     Comments: No edema  Skin:    General: Skin is warm and dry.  Neurological:     Mental Status: He is alert and oriented to person, place, and time.      CMP Latest Ref Rng & Units 06/14/2018  Glucose 70 - 99 mg/dL 126(H)  BUN 8 - 23 mg/dL 7(L)  Creatinine 0.61 - 1.24 mg/dL 0.66  Sodium 135 - 145 mmol/L 134(L)  Potassium 3.5 - 5.1 mmol/L 3.0(L)  Chloride 98 - 111 mmol/L 100  CO2 22 - 32 mmol/L 25  Calcium 8.9 - 10.3 mg/dL 8.5(L)  Total Protein 6.5 - 8.1 g/dL 6.3(L)  Total Bilirubin 0.3 - 1.2 mg/dL 0.5  Alkaline Phos 38 - 126 U/L 82  AST 15 - 41 U/L 34  ALT 0 - 44 U/L 58(H)   CBC Latest Ref Rng & Units 06/14/2018  WBC 4.0 - 10.5 K/uL 5.4  Hemoglobin 13.0 - 17.0 g/dL 9.3(L)  Hematocrit 39.0 - 52.0 % 28.6(L)  Platelets 150 - 400 K/uL 195    No images are attached to the encounter.  Ct Angio Chest Pe W Or Wo Contrast  Result Date: 06/07/2018 CLINICAL DATA:  73 year old male with history of adenocarcinoma of the lung. Shortness of breath. Supraventricular tachycardia. EXAM: CT ANGIOGRAPHY CHEST WITH CONTRAST TECHNIQUE: Multidetector CT imaging of the chest was performed using the standard protocol during bolus administration of intravenous contrast. Multiplanar CT image reconstructions and MIPs were obtained to evaluate the vascular  anatomy. CONTRAST:  70m OMNIPAQUE IOHEXOL 350 MG/ML SOLN COMPARISON:  PET-CT 03/23/2018.  Chest CT 03/17/2018. FINDINGS: Cardiovascular: No filling defects within the pulmonary arterial tree to suggest underlying  pulmonary embolism. Heart size is mildly enlarged. There is aortic atherosclerosis, as well as atherosclerosis of the great vessels of the mediastinum and the coronary arteries, including calcified atherosclerotic plaque in the left main and left anterior descending coronary arteries. Right internal jugular single-lumen porta cath with tip terminating in the distal superior vena cava. Mediastinum/Nodes: Multiple prominent borderline enlarged and enlarged mediastinal and hilar lymph nodes, largest of which is a subcarinal lymph node measuring up to 2.1 cm in short axis. Esophagus is unremarkable in appearance. Esophagus is unremarkable in appearance. No axillary lymphadenopathy. Lungs/Pleura: Widespread but patchy areas of ground-glass attenuation and septal thickening scattered throughout the lungs bilaterally, significantly increased compared to the prior study from October 2019. Scattered areas of cylindrical and mild varicose bronchiectasis. Some thick-walled cystic changes are noted in the right upper lobe. Volume loss and scarring in the right middle lobe. Trace bilateral pleural effusions. Upper Abdomen: Unremarkable. Musculoskeletal: There are no aggressive appearing lytic or blastic lesions noted in the visualized portions of the skeleton. Review of the MIP images confirms the above findings. IMPRESSION: 1. No evidence of pulmonary embolism. 2. The appearance the chest suggests severe multilobar pneumonia. There also scattered areas of what appear to be post infectious or inflammatory scarring in the lungs bilaterally. 3. Aortic atherosclerosis, in addition to left main and left anterior descending coronary artery disease. Assessment for potential risk factor modification, dietary therapy or  pharmacologic therapy may be warranted, if clinically indicated. Aortic Atherosclerosis (ICD10-I70.0). Electronically Signed   By: Vinnie Langton M.D.   On: 06/07/2018 19:20   Dg Chest Portable 1 View  Result Date: 06/07/2018 CLINICAL DATA:  Two day history of dizziness and weakness. History of lung malignancy and hypertension. Discontinued smoking in October 2019 EXAM: PORTABLE CHEST 1 VIEW COMPARISON:  CT scan of the chest of March 17, 2018 and PET-CT study of March 23, 2018. FINDINGS: The lungs are well-expanded. The interstitial markings are coarse especially in the right infrahilar region. There is no alveolar infiltrate. The heart is top-normal in size. The pulmonary vascularity is not engorged. External pacemaker defibrillator pads are present. A porta catheter is present with the tip projecting over the distal third of the SVC. IMPRESSION: No focal pneumonia. Mild interstitial prominence bilaterally which is likely chronic. Top-normal cardiac size without pulmonary edema. Electronically Signed   By: David  Martinique M.D.   On: 06/07/2018 09:45     Assessment and plan- Patient is a 73 y.o. male withadenocarcinoma of the right middle lobe of the lung stage IIIBcT2 N3M0. Patient is here for routine follow-up of his lung cancer and post hospital discharge  Patient developed severe anemia likely secondary to prior chemotherapy as well as acute multilobar pneumonia.  He did receive blood transfusion a few days ago and his hemoglobin is improved to 9.3 today.  Symptomatically he feels better since discharge but he still not back to his baseline.  He did complete his antibiotic course yesterday.  He does restart radiation treatment today.  However I will have to hold his chemotherapy this week and plans to restart chemotherapy next week given that he is not completely improved.  Patient has baseline chronic lung disease as well as stage III lung cancer.  I do feel that he would benefit from an  outpatient pulmonary follow-up to optimize his medications and we will therefore refer him back with Dr. Patsey Berthold who did his initial biopsy  I will see him back in 1 week's time with CBC and CMP to  restart cycle 5 of chemotherapy at that time  Hypokalemia: We will give him 40 mEq of IV potassium today   Visit Diagnosis 1. Malignant neoplasm of middle lobe of right lung [facility])      Dr. Randa Evens, MD, MPH [facility] [phone] 06/15/2018 10:11 AM

## 2018-06-15 NOTE — Progress Notes (Signed)
Oxygen sat with exertion 85% on room air.  Sat came up to 94% on room air with rest.

## 2018-06-15 NOTE — ED Notes (Signed)
Lab called with a critical trop of 0.03. Dr. Cherylann Banas made aware

## 2018-06-15 NOTE — ED Provider Notes (Signed)
[facility] Emergency Department Provider Note ____________________________________________   First MD Initiated Contact with Patient 06/15/18 2128     (approximate)  I have reviewed the triage vital signs and the nursing notes.   HISTORY  Chief Complaint Shortness of Breath    HPI TKAI SERFASS is a 73 y.o. male with PMH as noted below including lung cancer, as well as recent admission for pneumonia who presents with increased shortness of breath, acute onset today, and occurring after he had a radiation treatment.  The patient denies any chest pain but states he does feel somewhat weak.  He does not have any fever, vomiting or diarrhea, or palpitations.   Past Medical History:  Diagnosis Date  . CVA (cerebral vascular accident) ([place])   . Hypertension   . Lung cancer [facility])     Patient Active Problem List   Diagnosis Date Noted  . HTN (hypertension) 06/15/2018  . Sepsis ([place]) 06/15/2018  . Acute respiratory failure with hypoxia ([place])   . SVT (supraventricular tachycardia) ([place]) 06/07/2018  . HCAP (healthcare-associated pneumonia) 06/07/2018  . Goals of care, counseling/discussion 05/17/2018  . Chemotherapy induced neutropenia ([place]) 05/10/2018  . CVA (cerebral vascular accident) ([place]) 04/07/2018  . Lung cancer ([place]) 04/05/2018  . Mass of middle lobe of right lung 03/29/2018  . Mediastinal mass     Past Surgical History:  Procedure Laterality Date  . CYST EXCISION Left    buttock  . ENDOBRONCHIAL ULTRASOUND Right 03/29/2018   Procedure: ENDOBRONCHIAL ULTRASOUND;  Surgeon: Tyler Pita, MD;  Location: [facility] ORS;  Service: Cardiopulmonary;  Laterality: Right;  . FLEXIBLE BRONCHOSCOPY Right 03/29/2018   Procedure: FLEXIBLE BRONCHOSCOPY;  Surgeon: Tyler Pita, MD;  Location: [facility] ORS;  Service: Cardiopulmonary;  Laterality: Right;  . PORTA CATH INSERTION N/A 04/13/2018   Procedure: PORTA CATH INSERTION;  Surgeon: Algernon Huxley, MD;   Location: [place] CV LAB;  Service: Cardiovascular;  Laterality: N/A;    Prior to Admission medications   Medication Sig Start Date End Date Taking? Authorizing Provider  albuterol (PROVENTIL HFA;VENTOLIN HFA) 108 (90 Base) MCG/ACT inhaler Inhale 2 puffs into the lungs every 6 (six) hours as needed for wheezing or shortness of breath. 06/10/18   Demetrios Loll, MD  aspirin EC 81 MG EC tablet Take 1 tablet (81 mg total) by mouth daily. 04/09/18   Fritzi Mandes, MD  atorvastatin (LIPITOR) 40 MG tablet Take 1 tablet (40 mg total) by mouth daily at 6 PM. 04/08/18   Fritzi Mandes, MD  budesonide-formoterol [facility]) 160-4.5 MCG/ACT inhaler Inhale 2 puffs into the lungs 2 (two) times daily.    [provider]  Cyanocobalamin (VITAMIN B-12 PO) Take 1 tablet by mouth daily.    [provider]  diltiazem (CARDIZEM CD) 180 MG 24 hr capsule Take 1 capsule (180 mg total) by mouth daily. 06/11/18   Demetrios Loll, MD  guaiFENesin-dextromethorphan [facility] DM) 100-10 MG/5ML syrup Take 5 mLs by mouth every 4 (four) hours as needed for cough. 06/10/18   Demetrios Loll, MD  Multiple Vitamins-Minerals (MULTIVITAMIN PO) Take 1 tablet by mouth daily.    [provider]  predniSONE (DELTASONE) 20 MG tablet Take 2 tablets (40 mg total) by mouth daily with breakfast. 06/10/18   Demetrios Loll, MD  Pyridoxine HCl (VITAMIN B-6 PO) Take 1 tablet by mouth daily.    [provider]    Allergies Aspirin  Family History  Problem Relation Age of Onset  . Diabetes Mother   .  CAD Mother   . Stroke Father     Social History Social History   Tobacco Use  . Smoking status: Former Smoker    Packs/day: 1.00    Years: 58.00    Pack years: 58.00    Types: Cigarettes    Last attempt to quit: 03/30/2018    Years since quitting: 0.2  . Smokeless tobacco: Never Used  Substance Use Topics  . Alcohol use: Not Currently    Comment: daily 32 oz beer  . Drug use: Not Currently    Review of  Systems  Constitutional: No fever. Eyes: No redness. ENT: No sore throat. Cardiovascular: Denies chest pain. Respiratory: Positive for shortness of breath. Gastrointestinal: No vomiting or diarrhea.  Genitourinary: Negative for dysuria.  Musculoskeletal: Negative for back pain. Skin: Negative for rash. Neurological: Negative for headache.  ____________________________________________   PHYSICAL EXAM:  VITAL SIGNS: ED Triage Vitals  Enc Vitals Group     BP 06/15/18 2100 140/84     Pulse Rate 06/15/18 2100 (!) 148     Resp 06/15/18 2100 (!) 28     Temp 06/15/18 2100 98 F (36.7 C)     Temp Source 06/15/18 2100 Oral     SpO2 06/15/18 2100 94 %     Weight --      Height --      Head Circumference --      Peak Flow --      Pain Score 06/15/18 2101 0     Pain Loc --      Pain Edu? --      Excl. in [place]? --     Constitutional: Alert and oriented.  Somewhat chronically ill-appearing but in no acute distress. Eyes: Conjunctivae are normal.  Head: Atraumatic. Nose: No congestion/rhinnorhea. Mouth/Throat: Mucous membranes are moist.   Neck: Normal range of motion.  Cardiovascular: Slightly tachycardic, regular rhythm. Grossly normal heart sounds.  Good peripheral circulation. Respiratory: Normal respiratory effort.  No retractions.  Scattered crackles throughout bilateral lung fields. Gastrointestinal: Soft and nontender. No distention.  Genitourinary: No flank tenderness. Musculoskeletal: No lower extremity edema.  Extremities warm and well perfused.  Neurologic:  Normal speech and language. No gross focal neurologic deficits are appreciated.  Skin:  Skin is warm and dry. No rash noted. Psychiatric: Mood and affect are normal. Speech and behavior are normal.  ____________________________________________   LABS (all labs ordered are listed, but only abnormal results are displayed)  Labs Reviewed  BASIC METABOLIC PANEL - Abnormal; Notable for the following components:       Result Value   Sodium 127 (*)    Potassium 3.1 (*)    Chloride 94 (*)    Glucose, Bld 115 (*)    Calcium 8.3 (*)    All other components within normal limits  CBC WITH DIFFERENTIAL/PLATELET - Abnormal; Notable for the following components:   RBC 3.12 (*)    Hemoglobin 9.7 (*)    HCT 29.7 (*)    RDW 18.4 (*)    Abs Immature Granulocytes 0.09 (*)    All other components within normal limits  TROPONIN I - Abnormal; Notable for the following components:   Troponin I 0.03 (*)    All other components within normal limits  CULTURE, BLOOD (ROUTINE X 2)  CULTURE, BLOOD (ROUTINE X 2)  BRAIN NATRIURETIC PEPTIDE  INFLUENZA PANEL BY PCR (TYPE A & B)  URINALYSIS, COMPLETE (UACMP) WITH MICROSCOPIC  CG4 I-STAT (LACTIC ACID)   ____________________________________________  EKG  ED ECG  REPORT I, Arta Silence, the attending physician, personally viewed and interpreted this ECG.  Date: 06/15/2018 EKG Time: 2103 Rate: 113 Rhythm: normal sinus rhythm QRS Axis: Right axis deviation Intervals: normal ST/T Wave abnormalities: Nonspecific T wave abnormalities laterally Narrative Interpretation: no evidence of acute ischemia; no significant change when compared to EKG of 06/07/2018  ____________________________________________  RADIOLOGY  CXR: Diffuse bilateral opacities with some improvement in the left lung  ____________________________________________   PROCEDURES  Procedure(s) performed: No  Procedures  Critical Care performed: No ____________________________________________   INITIAL IMPRESSION / ASSESSMENT AND PLAN / ED COURSE  Pertinent labs & imaging results that were available during my care of the patient were reviewed by me and considered in my medical decision making (see chart for details).  73 year old male with PMH as noted above including lung cancer as well as recent admission for SVT and pneumonia presents with acutely worsening shortness of breath today  after a radiation treatment.  On exam the patient is somewhat chronically ill and weak appearing but in no acute distress.  The patient's O2 saturation was in the low 90s on nasal cannula although he is not on oxygen at home.  His O2 was previously noted today to be in the mid 80s with exertion.  The remainder of the exam is as described above.  EKG shows no acute findings.  I reviewed the patient past medical records.  I saw him in the ED late last month with SVT.  He was admitted and his heart rate was well controlled on Cardizem.  He was diagnosed with pneumonia and discharged home on 06/10/2018.  Differential includes recurrent or worsening pneumonia, acute bronchitis, influenza, CHF, or possibly symptoms related to the patient's cancer.  We will obtain chest x-ray, labs, give fluids, and reassess.  ----------------------------------------- 11:43 PM on 06/15/2018 -----------------------------------------  Chest x-ray reveals persistent opacities.  Given that the patient became acutely more short of breath and was hypoxic today, this likely represents persistent pneumonia.  Since he is receiving treatment for the cancer and was just hospitalized, I will initiate antibiotics for HCAP.  He is also hyponatremic and is getting an infusion of normal saline.  The patient will require admission for continued antibiotics.  I discussed the results of the work-up with him and his family members.  They agree with the plan.  I signed him out to the hospitalist Dr. Jannifer Franklin. ____________________________________________   FINAL CLINICAL IMPRESSION(S) / ED DIAGNOSES  Final diagnoses:  HCAP (healthcare-associated pneumonia)  Hyponatremia      NEW MEDICATIONS STARTED DURING THIS VISIT:  New Prescriptions   No medications on file     Note:  This document was prepared using Dragon voice recognition software and may include unintentional dictation errors.    Arta Silence, MD 06/15/18 [phone]

## 2018-06-16 ENCOUNTER — Ambulatory Visit: Payer: Medicare PPO

## 2018-06-16 ENCOUNTER — Other Ambulatory Visit: Payer: Self-pay

## 2018-06-16 ENCOUNTER — Telehealth: Payer: Self-pay | Admitting: *Deleted

## 2018-06-16 LAB — BASIC METABOLIC PANEL
Anion gap: 6 (ref 5–15)
BUN: 8 mg/dL (ref 8–23)
CALCIUM: 7.8 mg/dL — AB (ref 8.9–10.3)
CHLORIDE: 104 mmol/L (ref 98–111)
CO2: 23 mmol/L (ref 22–32)
Creatinine, Ser: 0.65 mg/dL (ref 0.61–1.24)
GFR calc Af Amer: >60 mL/min (ref >60)
GFR calc non Af Amer: >60 mL/min (ref >60)
Glucose, Bld: 109 mg/dL — ABNORMAL HIGH (ref 70–99)
Potassium: 3.5 mmol/L (ref 3.5–5.1)
Sodium: 133 mmol/L — ABNORMAL LOW (ref 135–145)

## 2018-06-16 LAB — CBC
HCT: 25.8 % — ABNORMAL LOW (ref 39.0–52.0)
Hemoglobin: 8.4 g/dL — ABNORMAL LOW (ref 13.0–17.0)
MCH: 31 pg (ref 26.0–34.0)
MCHC: 32.6 g/dL (ref 30.0–36.0)
MCV: 95.2 fL (ref 80.0–100.0)
Platelets: 179 10*3/uL (ref 150–400)
RBC: 2.71 MIL/uL — ABNORMAL LOW (ref 4.22–5.81)
RDW: 18.3 % — AB (ref 11.5–15.5)
WBC: 4.1 10*3/uL (ref 4.0–10.5)
nRBC: 0 % (ref 0.0–0.2)

## 2018-06-16 LAB — MRSA PCR SCREENING: MRSA by PCR: NEGATIVE

## 2018-06-16 MED ORDER — ENOXAPARIN SODIUM 40 MG/0.4ML ~~LOC~~ SOLN
40.0000 mg | SUBCUTANEOUS | Status: DC
  Administered 2018-06-16 – 2018-06-18 (×3): 40 mg via SUBCUTANEOUS
  Filled 2018-06-16 (×3): qty 0.4

## 2018-06-16 MED ORDER — POTASSIUM CHLORIDE CRYS ER 20 MEQ PO TBCR
40.0000 meq | EXTENDED_RELEASE_TABLET | Freq: Once | ORAL | Status: AC
  Administered 2018-06-16: 10:00:00 40 meq via ORAL
  Filled 2018-06-16: qty 2

## 2018-06-16 MED ORDER — ATORVASTATIN CALCIUM 20 MG PO TABS
40.0000 mg | ORAL_TABLET | Freq: Every day | ORAL | Status: DC
  Administered 2018-06-16 – 2018-06-18 (×3): 40 mg via ORAL
  Filled 2018-06-16 (×3): qty 2

## 2018-06-16 MED ORDER — DILTIAZEM HCL ER COATED BEADS 180 MG PO CP24
180.0000 mg | ORAL_CAPSULE | Freq: Every day | ORAL | Status: DC
  Administered 2018-06-16: 180 mg via ORAL
  Filled 2018-06-16: qty 1

## 2018-06-16 MED ORDER — ACETAMINOPHEN 325 MG PO TABS
650.0000 mg | ORAL_TABLET | Freq: Four times a day (QID) | ORAL | Status: DC | PRN
  Administered 2018-06-16: 650 mg via ORAL
  Filled 2018-06-16: qty 2

## 2018-06-16 MED ORDER — DOXYCYCLINE HYCLATE 100 MG PO TABS
100.0000 mg | ORAL_TABLET | Freq: Two times a day (BID) | ORAL | Status: DC
  Administered 2018-06-16 – 2018-06-19 (×7): 100 mg via ORAL
  Filled 2018-06-16 (×8): qty 1

## 2018-06-16 MED ORDER — ASPIRIN EC 81 MG PO TBEC
81.0000 mg | DELAYED_RELEASE_TABLET | Freq: Every day | ORAL | Status: DC
  Administered 2018-06-16 – 2018-06-19 (×4): 81 mg via ORAL
  Filled 2018-06-16 (×3): qty 1

## 2018-06-16 MED ORDER — ONDANSETRON HCL 4 MG/2ML IJ SOLN: 4.0000 mg | Freq: Four times a day (QID) | INTRAMUSCULAR | Status: DC | PRN

## 2018-06-16 MED ORDER — DILTIAZEM HCL ER COATED BEADS 120 MG PO CP24
120.0000 mg | ORAL_CAPSULE | Freq: Once | ORAL | Status: AC
  Administered 2018-06-16: 120 mg via ORAL
  Filled 2018-06-16: qty 1

## 2018-06-16 MED ORDER — DILTIAZEM HCL ER COATED BEADS 300 MG PO CP24
300.0000 mg | ORAL_CAPSULE | Freq: Every day | ORAL | Status: DC
  Administered 2018-06-17 – 2018-06-19 (×3): 300 mg via ORAL
  Filled 2018-06-16 (×4): qty 1

## 2018-06-16 MED ORDER — VANCOMYCIN HCL IN DEXTROSE 750-5 MG/150ML-% IV SOLN: 750.0000 mg | Freq: Two times a day (BID) | INTRAVENOUS | Status: DC

## 2018-06-16 MED ORDER — ONDANSETRON HCL 4 MG PO TABS: 4.0000 mg | ORAL_TABLET | Freq: Four times a day (QID) | ORAL | Status: DC | PRN

## 2018-06-16 MED ORDER — BENZONATATE 100 MG PO CAPS: 200.0000 mg | ORAL_CAPSULE | Freq: Three times a day (TID) | ORAL | Status: DC | PRN

## 2018-06-16 MED ORDER — ACETAMINOPHEN 650 MG RE SUPP: 650.0000 mg | Freq: Four times a day (QID) | RECTAL | Status: DC | PRN

## 2018-06-16 MED ORDER — MOMETASONE FURO-FORMOTEROL FUM 200-5 MCG/ACT IN AERO
2.0000 | INHALATION_SPRAY | Freq: Two times a day (BID) | RESPIRATORY_TRACT | Status: DC
  Administered 2018-06-16 – 2018-06-19 (×6): 2 via RESPIRATORY_TRACT
  Filled 2018-06-16: qty 8.8

## 2018-06-16 MED ORDER — METHYLPREDNISOLONE SODIUM SUCC 40 MG IJ SOLR
40.0000 mg | Freq: Two times a day (BID) | INTRAMUSCULAR | Status: DC
  Administered 2018-06-16 – 2018-06-19 (×6): 40 mg via INTRAVENOUS
  Filled 2018-06-16 (×6): qty 1

## 2018-06-16 MED ORDER — GUAIFENESIN-DM 100-10 MG/5ML PO SYRP
5.0000 mL | ORAL_SOLUTION | ORAL | Status: DC | PRN
  Filled 2018-06-16: qty 5

## 2018-06-16 MED ORDER — PREDNISONE 20 MG PO TABS
40.0000 mg | ORAL_TABLET | Freq: Every day | ORAL | Status: DC
  Administered 2018-06-16: 10:00:00 40 mg via ORAL
  Filled 2018-06-16: qty 2

## 2018-06-16 MED ORDER — DOXYCYCLINE HYCLATE 100 MG PO TABS
100.0000 mg | ORAL_TABLET | Freq: Two times a day (BID) | ORAL | Status: DC
  Filled 2018-06-16: qty 1

## 2018-06-16 MED ORDER — LEVALBUTEROL HCL 1.25 MG/0.5ML IN NEBU: 1.2500 mg | INHALATION_SOLUTION | Freq: Four times a day (QID) | RESPIRATORY_TRACT | Status: DC | PRN

## 2018-06-16 NOTE — Progress Notes (Addendum)
Pharmacy Antibiotic Note  Philip Weiss is a 73 y.o. male admitted on 06/15/2018 with pneumonia.  Pharmacy has been consulted for vancomycin and cefepime dosing.  Plan: DW 68kg  VD 44L kei 0.072  T1/2 10 hours Vancomycin 1250 mg ordered as load dose.  Vancomycin 750 mg IV Q 12 hrs. Goal AUC 400-550. Expected AUC: 473 SCr used: 0.8  Cefepime 2 grams q 8 hours ordered    Temp (24hrs), Avg:98 F (36.7 C), Min:98 F (36.7 C), Max:98 F (36.7 C)  Recent Labs  Lab 06/09/18 0409 06/14/18 0903 06/15/18 2109 06/15/18 2117  WBC 7.2 5.4 6.6  --   CREATININE 0.74 0.66 0.72  --   LATICACIDVEN  --   --   --  1.41    Estimated Creatinine Clearance: 78.1 mL/min (by C-G formula based on SCr of 0.72 mg/dL).    Allergies  Allergen Reactions  . Aspirin Itching    Patient states may not be allergic, he was taking too many aspirin and became itchy. As long as he takes one tablet daily, he is fine    Antimicrobials this admission: Vancomycin, cefepime  >>    >>   Dose adjustments this admission:   Microbiology results: 1/8 BCx: pending 1/9 MRSA PCR: pending      1/8 CXR suspicious for pneumonia  Thank you for allowing pharmacy to be a part of this patient's care.  Philip Weiss S 06/16/2018 12:04 AM

## 2018-06-16 NOTE — Plan of Care (Signed)
  Problem: Education: Goal: Knowledge of General Education information will improve Description Including pain rating scale, medication(s)/side effects and non-pharmacologic comfort measures Outcome: Progressing   Problem: Health Behavior/Discharge Planning: Goal: Ability to manage health-related needs will improve Outcome: Progressing   Problem: Clinical Measurements: Goal: Ability to maintain clinical measurements within normal limits will improve Outcome: Progressing Goal: Diagnostic test results will improve Outcome: Progressing   Problem: Activity: Goal: Risk for activity intolerance will decrease Outcome: Progressing   Problem: Nutrition: Goal: Adequate nutrition will be maintained Outcome: Progressing   Problem: Safety: Goal: Ability to remain free from injury will improve Outcome: Progressing   Problem: Skin Integrity: Goal: Risk for impaired skin integrity will decrease Outcome: Progressing

## 2018-06-16 NOTE — Progress Notes (Signed)
Patient ID: Philip Weiss, male   DOB: 22-Sep-1945, 73 y.o.   MRN: [id number]  [employer] PROGRESS NOTE  MOHAMUD MROZEK QVZ:[id number] DOB: Jan 17, 1946 DOA: 06/15/2018 PCP: [facility]  HPI/Subjective: Patient feeling okay.  Some shortness of breath.  Objective: Vitals:   06/16/18 0424 06/16/18 1303  BP: 122/69 129/72  Pulse: (!) 102 (!) 101  Resp: 19 18  Temp: 98.6 F (37 C) 97.9 F (36.6 C)  SpO2: 98% 96%    Filed Weights   06/16/18 0316 06/16/18 0359  Weight: 68.6 kg 68.6 kg    ROS: Review of Systems  Constitutional: Negative for chills and fever.  Eyes: Negative for blurred vision.  Respiratory: Positive for shortness of breath. Negative for cough.   Cardiovascular: Negative for chest pain.  Gastrointestinal: Negative for abdominal pain, constipation, diarrhea, nausea and vomiting.  Genitourinary: Negative for dysuria.  Musculoskeletal: Negative for joint pain.  Neurological: Negative for dizziness and headaches.   Exam: Physical Exam  Constitutional: He is oriented to person, place, and time.  HENT:  Nose: No mucosal edema.  Mouth/Throat: No oropharyngeal exudate or posterior oropharyngeal edema.  Eyes: Pupils are equal, round, and reactive to light. Conjunctivae, EOM and lids are normal.  Neck: No JVD present. Carotid bruit is not present. No edema present. No thyroid mass and no thyromegaly present.  Cardiovascular: S1 normal and S2 normal. Exam reveals no gallop.  No murmur heard. Pulses:      Dorsalis pedis pulses are 2+ on the right side and 2+ on the left side.  Respiratory: No respiratory distress. He has decreased breath sounds in the right middle field, the right lower field, the left middle field and the left lower field. He has no wheezes. He has no rhonchi. He has no rales.  GI: Soft. Bowel sounds are normal. There is no abdominal tenderness.  Musculoskeletal:     Right ankle: He exhibits no swelling.     Left ankle: He exhibits no  swelling.  Lymphadenopathy:    He has no cervical adenopathy.  Neurological: He is alert and oriented to person, place, and time. No cranial nerve deficit.  Skin: Skin is warm. No rash noted. Nails show no clubbing.  Psychiatric: He has a normal mood and affect.      Data Reviewed: Basic Metabolic Panel: Recent Labs  Lab 06/14/18 0903 06/15/18 2109 06/16/18 0438  NA 134* 127* 133*  K 3.0* 3.1* 3.5  CL 100 94* 104  CO2 25 23 23   GLUCOSE 126* 115* 109*  BUN 7* 9 8  CREATININE 0.66 0.72 0.65  CALCIUM 8.5* 8.3* 7.8*   Liver Function Tests: Recent Labs  Lab 06/14/18 0903  AST 34  ALT 58*  ALKPHOS 82  BILITOT 0.5  PROT 6.3*  ALBUMIN 2.5*   CBC: Recent Labs  Lab 06/10/18 0741 06/14/18 0903 06/15/18 2109 06/16/18 0438  WBC  --  5.4 6.6 4.1  NEUTROABS  --  3.8 4.4  --   HGB 8.8* 9.3* 9.7* 8.4*  HCT  --  28.6* 29.7* 25.8*  MCV  --  93.8 95.2 95.2  PLT  --  195 207 179   Cardiac Enzymes: Recent Labs  Lab 06/15/18 2109  TROPONINI 0.03*   BNP (last 3 results) Recent Labs    06/15/18 2109  BNP 51.0     Recent Results (from the past 240 hour(s))  Culture, blood (Routine X 2) w Reflex to ID Panel     Status: None (Preliminary  result)   Collection Time: 06/16/18 12:10 AM  Result Value Ref Range Status   Specimen Description BLOOD RIGHT ANTECUBITAL  Final   Special Requests   Final    BOTTLES DRAWN AEROBIC AND ANAEROBIC Blood Culture results may not be optimal due to an excessive volume of blood received in culture bottles   Culture   Final    NO GROWTH < 12 HOURS Performed at [facility], [address] [place], [place] [address]    Report Status PENDING  Incomplete  Culture, blood (Routine X 2) w Reflex to ID Panel     Status: None (Preliminary result)   Collection Time: 06/16/18 12:10 AM  Result Value Ref Range Status   Specimen Description BLOOD BLOOD RIGHT FOREARM  Final   Special Requests   Final    BOTTLES DRAWN AEROBIC AND ANAEROBIC  Blood Culture adequate volume   Culture   Final    NO GROWTH < 12 HOURS Performed at [facility], [address], [place], [place] [address]    Report Status PENDING  Incomplete  MRSA PCR Screening     Status: None   Collection Time: 06/16/18  3:41 AM  Result Value Ref Range Status   MRSA by PCR NEGATIVE NEGATIVE Final    Comment:        The GeneXpert MRSA Assay (FDA approved for NASAL specimens only), is one component of a comprehensive MRSA colonization surveillance program. It is not intended to diagnose MRSA infection nor to guide or monitor treatment for MRSA infections. Performed at [facility], [address], [place], [place] [address]      Studies: Dg Chest Portable 1 View  Result Date: 06/15/2018 CLINICAL DATA:  Shortness of breath. EXAM: PORTABLE CHEST 1 VIEW COMPARISON:  Radiographs and CT 06/07/2018 FINDINGS: Right chest port in the mid SVC. Unchanged heart size and mediastinal contours. Mild chronic volume loss in the right lung. Diffuse interstitial opacities corresponding to ground-glass opacities on CT. Slight improvement in the left lung from prior exam. Small pleural effusions on CT are not seen radiographically. No pneumothorax. Unchanged osseous structures. IMPRESSION: 1. Diffuse lung opacities with slight improvement in the left lung from prior exam. Unchanged appearance of the right hemithorax. CT findings suspicious for pneumonia. 2. No new abnormalities. Electronically Signed   By: Keith Rake M.D.   On: 06/15/2018 21:32    Scheduled Meds: . aspirin EC  81 mg Oral Daily  . atorvastatin  40 mg Oral q1800  . [START ON 06/17/2018] diltiazem  300 mg Oral Daily  . doxycycline  100 mg Oral BID WC  . enoxaparin (LOVENOX) injection  40 mg Subcutaneous Q24H  . methylPREDNISolone (SOLU-MEDROL) injection  40 mg Intravenous Q12H  . mometasone-formoterol  2 puff Inhalation BID   Continuous Infusions: . ceFEPime (MAXIPIME) IV Stopped  (06/16/18 1240)    Assessment/Plan:  1. Acute hypoxic respiratory failure.  Pulse ox with ambulation in the 80s. 2. COPD exacerbation and recent pneumonia.  Sepsis ruled out.  Patient given cefepime and doxycycline here.  Switch prednisone over to Solu-Medrol. 3. SVT.  Increase Cardizem CD to 300 mg daily 4. Adenocarcinoma lung stage III.  Follow-up with oncology and radiation therapy as outpatient. 5. Recent stroke on aspirin and atorvastatin 6. Hyperlipidemia unspecified on atorvastatin  Code Status:     Code Status Orders  (From admission, onward)         Start     Ordered   06/16/18 0220  Do  not attempt resuscitation (DNR)  Continuous    Question Answer Comment  In the event of cardiac or respiratory ARREST Do not call a "code blue"   In the event of cardiac or respiratory ARREST Do not perform Intubation, CPR, defibrillation or ACLS   In the event of cardiac or respiratory ARREST Use medication by any route, position, wound care, and other measures to relive pain and suffering. May use oxygen, suction and manual treatment of airway obstruction as needed for comfort.   Comments nurse may pronounce      06/16/18 0219        Code Status History    Date Active Date Inactive Code Status Order ID Comments User Context   06/07/2018 1236 06/10/2018 1822 DNR [id number]  Loletha Grayer, MD ED   04/07/2018 1647 04/08/2018 2059 DNR [id number]  Loletha Grayer, MD ED    Advance Directive Documentation     Most Recent Value  Type of Advance Directive  Healthcare Power of [place], Out of facility DNR (pink MOST or yellow form)  Pre-existing out of facility DNR order (yellow form or pink MOST form)  -  "MOST" Form in Place?  -     Family Communication: Wife at the bedside Disposition Plan: To be determined  Antibiotics:  Cefepime  Doxycycline  Time spent: 28 minutes  [place]

## 2018-06-16 NOTE — Progress Notes (Signed)
Dr. Janese Banks asked me to check sat on Ra  At rest and it was 93 % and then walk him around the hallway and check it . I walked him around first hallway to 2nd hallway and sat came down to 89% but when he stopped walking it went back up to 91% within a few seconds of being stil

## 2018-06-16 NOTE — Telephone Encounter (Signed)
Patient wife called reporting that he will not make his radiation therapy appointment today because he is in the hospital with pneumonia

## 2018-06-17 ENCOUNTER — Ambulatory Visit: Payer: Medicare PPO

## 2018-06-17 DIAGNOSIS — E871 Hypo-osmolality and hyponatremia: Secondary | ICD-10-CM

## 2018-06-17 DIAGNOSIS — J9601 Acute respiratory failure with hypoxia: Secondary | ICD-10-CM

## 2018-06-17 DIAGNOSIS — Z87891 Personal history of nicotine dependence: Secondary | ICD-10-CM

## 2018-06-17 DIAGNOSIS — C3490 Malignant neoplasm of unspecified part of unspecified bronchus or lung: Secondary | ICD-10-CM

## 2018-06-17 DIAGNOSIS — Z79899 Other long term (current) drug therapy: Secondary | ICD-10-CM

## 2018-06-17 DIAGNOSIS — D649 Anemia, unspecified: Secondary | ICD-10-CM

## 2018-06-17 MED ORDER — BUTAMBEN-TETRACAINE-BENZOCAINE 2-2-14 % EX AERO
1.0000 | INHALATION_SPRAY | Freq: Once | CUTANEOUS | Status: AC
  Administered 2018-06-17: 1 via TOPICAL
  Filled 2018-06-17: qty 20

## 2018-06-17 MED ORDER — SODIUM CHLORIDE 0.9 % IV SOLN
INTRAVENOUS | Status: DC | PRN
  Administered 2018-06-17: 5 mL via INTRAVENOUS

## 2018-06-17 MED ORDER — POTASSIUM CHLORIDE CRYS ER 20 MEQ PO TBCR
40.0000 meq | EXTENDED_RELEASE_TABLET | Freq: Once | ORAL | Status: AC
  Administered 2018-06-17: 40 meq via ORAL
  Filled 2018-06-17: qty 2

## 2018-06-17 NOTE — Progress Notes (Signed)
Patient ID: Philip Weiss, male   DOB: 1945/07/31, 73 y.o.   MRN: [id number]  [employer] PROGRESS NOTE  Philip Weiss [id number]:[id number] DOB: [birth year] DOA: 06/15/2018 PCP: [facility] [place]  HPI/Subjective: Patient feels okay at rest.  When he starts walking around that is when he gets short of breath and tachycardic.  Some cough.  Some shortness of breath.  Objective: Vitals:   06/17/18 0834 06/17/18 1234  BP: 122/82 115/71  Pulse: 98 100  Resp:  18  Temp:  98.7 F (37.1 C)  SpO2: 100% 96%    Filed Weights   06/16/18 0316 06/16/18 0359  Weight: 68.6 kg 68.6 kg    ROS: Review of Systems  Constitutional: Negative for chills and fever.  Eyes: Negative for blurred vision.  Respiratory: Positive for cough and shortness of breath.   Cardiovascular: Negative for chest pain.  Gastrointestinal: Negative for abdominal pain, constipation, diarrhea, nausea and vomiting.  Genitourinary: Negative for dysuria.  Musculoskeletal: Negative for joint pain.  Neurological: Negative for dizziness and headaches.   Exam: Physical Exam  Constitutional: He is oriented to person, place, and time.  HENT:  Nose: No mucosal edema.  Mouth/Throat: No oropharyngeal exudate or posterior oropharyngeal edema.  Eyes: Pupils are equal, round, and reactive to light. Conjunctivae, EOM and lids are normal.  Neck: No JVD present. Carotid bruit is not present. No edema present. No thyroid mass and no thyromegaly present.  Cardiovascular: S1 normal and S2 normal. Exam reveals no gallop.  No murmur heard. Pulses:      Dorsalis pedis pulses are 2+ on the right side and 2+ on the left side.  Respiratory: No respiratory distress. He has decreased breath sounds in the right middle field, the right lower field, the left middle field and the left lower field. He has no wheezes. He has no rhonchi. He has no rales.  GI: Soft. Bowel sounds are normal. There is no abdominal tenderness.  Musculoskeletal:   Right ankle: He exhibits no swelling.     Left ankle: He exhibits no swelling.  Lymphadenopathy:    He has no cervical adenopathy.  Neurological: He is alert and oriented to person, place, and time. No cranial nerve deficit.  Skin: Skin is warm. No rash noted. Nails show no clubbing.  Psychiatric: He has a normal mood and affect.      Data Reviewed: Basic Metabolic Panel: Recent Labs  Lab 06/14/18 0903 06/15/18 2109 06/16/18 0438  NA 134* 127* 133*  K 3.0* 3.1* 3.5  CL 100 94* 104  CO2 25 23 23   GLUCOSE 126* 115* 109*  BUN 7* 9 8  CREATININE 0.66 0.72 0.65  CALCIUM 8.5* 8.3* 7.8*   Liver Function Tests: Recent Labs  Lab 06/14/18 0903  AST 34  ALT 58*  ALKPHOS 82  BILITOT 0.5  PROT 6.3*  ALBUMIN 2.5*   CBC: Recent Labs  Lab 06/14/18 0903 06/15/18 2109 06/16/18 0438  WBC 5.4 6.6 4.1  NEUTROABS 3.8 4.4  --   HGB 9.3* 9.7* 8.4*  HCT 28.6* 29.7* 25.8*  MCV 93.8 95.2 95.2  PLT 195 207 179   Cardiac Enzymes: Recent Labs  Lab 06/15/18 2109  TROPONINI 0.03*   BNP (last 3 results) Recent Labs    06/15/18 2109  BNP 51.0     Recent Results (from the past 240 hour(s))  Culture, blood (Routine X 2) w Reflex to ID Panel     Status: None (Preliminary result)   Collection Time: 06/16/18  12:10 AM  Result Value Ref Range Status   Specimen Description BLOOD RIGHT ANTECUBITAL  Final   Special Requests   Final    BOTTLES DRAWN AEROBIC AND ANAEROBIC Blood Culture results may not be optimal due to an excessive volume of blood received in culture bottles   Culture   Final    NO GROWTH 1 DAY Performed at [facility], [address] [place], [place] [address]    Report Status PENDING  Incomplete  Culture, blood (Routine X 2) w Reflex to ID Panel     Status: None (Preliminary result)   Collection Time: 06/16/18 12:10 AM  Result Value Ref Range Status   Specimen Description BLOOD BLOOD RIGHT FOREARM  Final   Special Requests   Final    BOTTLES DRAWN  AEROBIC AND ANAEROBIC Blood Culture adequate volume   Culture   Final    NO GROWTH 1 DAY Performed at [facility], [address], [place], [place] [address]    Report Status PENDING  Incomplete  MRSA PCR Screening     Status: None   Collection Time: 06/16/18  3:41 AM  Result Value Ref Range Status   MRSA by PCR NEGATIVE NEGATIVE Final    Comment:        The GeneXpert MRSA Assay (FDA approved for NASAL specimens only), is one component of a comprehensive MRSA colonization surveillance program. It is not intended to diagnose MRSA infection nor to guide or monitor treatment for MRSA infections. Performed at [facility], [address], [place], [place] [address]      Studies: Dg Chest Portable 1 View  Result Date: 06/15/2018 CLINICAL DATA:  Shortness of breath. EXAM: PORTABLE CHEST 1 VIEW COMPARISON:  Radiographs and CT 06/07/2018 FINDINGS: Right chest port in the mid SVC. Unchanged heart size and mediastinal contours. Mild chronic volume loss in the right lung. Diffuse interstitial opacities corresponding to ground-glass opacities on CT. Slight improvement in the left lung from prior exam. Small pleural effusions on CT are not seen radiographically. No pneumothorax. Unchanged osseous structures. IMPRESSION: 1. Diffuse lung opacities with slight improvement in the left lung from prior exam. Unchanged appearance of the right hemithorax. CT findings suspicious for pneumonia. 2. No new abnormalities. Electronically Signed   By: Keith Rake M.D.   On: 06/15/2018 21:32    Scheduled Meds: . aspirin EC  81 mg Oral Daily  . atorvastatin  40 mg Oral q1800  . diltiazem  300 mg Oral Daily  . doxycycline  100 mg Oral BID WC  . enoxaparin (LOVENOX) injection  40 mg Subcutaneous Q24H  . methylPREDNISolone (SOLU-MEDROL) injection  40 mg Intravenous Q12H  . mometasone-formoterol  2 puff Inhalation BID  . potassium chloride  40 mEq Oral Once   Continuous Infusions: .  sodium chloride Stopped (06/17/18 0113)  . ceFEPime (MAXIPIME) IV Stopped (06/17/18 0844)    Assessment/Plan:  1. Acute hypoxic respiratory failure.  So far patient is unable to come off oxygen because his pulse ox dropped down into the 80s with ambulation just a few steps into the hallway.  Likely will need home oxygen.  I put in a DME nebulizer for home nebulizer. 2. COPD exacerbation and recent pneumonia.  Sepsis ruled out.  Patient on aggressive antibiotics.  Case discussed with patient's pulmonologist Dr. Patsey Berthold.  She will follow-up in a couple weeks. 3. SVT.  Increased Cardizem CD to 300 mg daily 4. Adenocarcinoma lung stage III.  Follow-up with oncology and radiation  therapy as outpatient. 5. Recent stroke on aspirin and atorvastatin 6. Hyperlipidemia unspecified on atorvastatin  Code Status:     Code Status Orders  (From admission, onward)         Start     Ordered   06/16/18 0220  Do not attempt resuscitation (DNR)  Continuous    Question Answer Comment  In the event of cardiac or respiratory ARREST Do not call a "code blue"   In the event of cardiac or respiratory ARREST Do not perform Intubation, CPR, defibrillation or ACLS   In the event of cardiac or respiratory ARREST Use medication by any route, position, wound care, and other measures to relive pain and suffering. May use oxygen, suction and manual treatment of airway obstruction as needed for comfort.   Comments nurse may pronounce      06/16/18 0219        Code Status History    Date Active Date Inactive Code Status Order ID Comments User Context   06/07/2018 1236 06/10/2018 1822 DNR [id number]  Loletha Grayer, MD ED   04/07/2018 1647 04/08/2018 2059 DNR [id number]  Loletha Grayer, MD ED    Advance Directive Documentation     Most Recent Value  Type of Advance Directive  Healthcare Power of [place], Out of facility DNR (pink MOST or yellow form)  Pre-existing out of facility DNR order (yellow form or pink  MOST form)  -  "MOST" Form in Place?  -     Family Communication: Wife at the bedside Disposition Plan: To be determined  Antibiotics:  Cefepime  Doxycycline  Time spent: 27 minutes  [place]

## 2018-06-17 NOTE — Consult Note (Signed)
Hematology/Oncology Consult note [facility] Telephone:([phone] [phone]  Patient Care Team: Center, [facility] as PCP - General ([place]) Telford Nab, RN as Registered Nurse   Name of the patient: Philip Weiss  [id number]  [birth year]    Reason for consult- h/o lung cancer   Requesting physician- Dr. 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  He completed 4 cycles of concurrent chemoradiation.  Plan was to restart chemoradiation after a brief break of about 3 weeks.  Patient was recently hospitalized for healthcare associated pneumonia and was discharged on oral antibiotics.  He was seen in the clinic by me on 06/14/2018.  At that time he felt that his breathing was improving but he was still not quite back to his baseline.  I therefore did not restart his chemotherapy on that day although he did start his radiation back.  Plan was to reevaluate him in 1 week's time with reconsideration to start chemotherapy at that time.  1 day later patient states that he felt dizzy when he was at his home and extremely short of breath and was brought back to the hospital.  Chest x-ray again showed persistent pneumonia and he is being admitted for healthcare associated pneumonia and is on IV antibiotics.  At baseline patient has a poor pulmonary reserve although he is not on home oxygen.  He continues to have some productive sputum but denies any fevers.  ECOG PS- 2  Pain scale- 0   Review of systems- Review of Systems  Constitutional: Positive for malaise/fatigue. Negative for chills, fever and weight loss.  HENT: Negative for congestion, ear discharge and nosebleeds.   Eyes: Negative for blurred vision.  Respiratory: Positive for cough and shortness of breath. Negative for hemoptysis, sputum production and wheezing.     Cardiovascular: Negative for chest pain, palpitations, orthopnea and claudication.  Gastrointestinal: Negative for abdominal pain, blood in stool, constipation, diarrhea, heartburn, melena, nausea and vomiting.  Genitourinary: Negative for dysuria, flank pain, frequency, hematuria and urgency.  Musculoskeletal: Negative for back pain, joint pain and myalgias.  Skin: Negative for rash.  Neurological: Negative for dizziness, tingling, focal weakness, seizures, weakness and headaches.  Endo/Heme/Allergies: Does not bruise/bleed easily.  Psychiatric/Behavioral: Negative for depression and suicidal ideas. The patient does not have insomnia.     Allergies  Allergen Reactions  . Aspirin Itching    Patient states may not be allergic, he was taking too many aspirin and became itchy. As long as he takes one tablet daily, he is fine    Patient Active Problem List   Diagnosis Date Noted  . HTN (hypertension) 06/15/2018  . Sepsis ([place]) 06/15/2018  . Acute respiratory failure with hypoxia ([place])   . SVT (supraventricular tachycardia) ([place]) 06/07/2018  . HCAP (healthcare-associated pneumonia) 06/07/2018  . Goals of care, counseling/discussion 05/17/2018  . Chemotherapy induced neutropenia ([place]) 05/10/2018  . CVA (cerebral vascular accident) ([place]) 04/07/2018  . Lung cancer ([place]) 04/05/2018  . Mass of middle lobe of right lung 03/29/2018  . Mediastinal mass      Past Medical History:  Diagnosis Date  . CVA (cerebral vascular accident) ([place])   . Hypertension   . Lung cancer [facility])      Past Surgical History:  Procedure Laterality Date  . CYST EXCISION Left    buttock  . ENDOBRONCHIAL ULTRASOUND Right 03/29/2018  Procedure: ENDOBRONCHIAL ULTRASOUND;  Surgeon: Tyler Pita, MD;  Location: [facility] ORS;  Service: Cardiopulmonary;  Laterality: Right;  . FLEXIBLE BRONCHOSCOPY Right 03/29/2018   Procedure: FLEXIBLE BRONCHOSCOPY;  Surgeon: Tyler Pita, MD;  Location: [facility] ORS;   Service: Cardiopulmonary;  Laterality: Right;  . PORTA CATH INSERTION N/A 04/13/2018   Procedure: PORTA CATH INSERTION;  Surgeon: Algernon Huxley, MD;  Location: [facility];  Service: Cardiovascular;  Laterality: N/A;    Social History   Socioeconomic History  . Marital status: Married    Spouse name: Not on file  . Number of children: 4  . Years of education: Not on file  . Highest education level: Not on file  Occupational History  . Occupation: retired    Comment: mill/construction   Social Needs  . Financial resource strain: Not hard at all  . Food insecurity:    Worry: Never true    Inability: Never true  . Transportation needs:    Medical: No    Non-medical: No  Tobacco Use  . Smoking status: Former Smoker    Packs/day: 1.00    Years: 58.00    Pack years: 58.00    Types: Cigarettes    Last attempt to quit: 03/30/2018    Years since quitting: 0.2  . Smokeless tobacco: Never Used  Substance and Sexual Activity  . Alcohol use: Not Currently    Comment: daily 32 oz beer  . Drug use: Not Currently  . Sexual activity: Not Currently  Lifestyle  . Physical activity:    Days per week: 0 days    Minutes per session: 0 min  . Stress: Only a little  Relationships  . Social connections:    Talks on phone: More than three times a week    Gets together: Twice a week    Attends religious service: More than 4 times per year    Active member of club or organization: Not on file    Attends meetings of clubs or organizations: Not on file    Relationship status: Married  . Intimate partner violence:    Fear of current or ex partner: No    Emotionally abused: No    Physically abused: No    Forced sexual activity: No  Other Topics Concern  . Not on file  Social History Narrative   Lives with wife at home     Family History  Problem Relation Age of Onset  . Diabetes Mother   . CAD Mother   . Stroke Father      Current Facility-Administered Medications:  .   0.9 %  sodium chloride infusion, , Intravenous, PRN, Loletha Grayer, MD, Stopped at 06/17/18 0113 .  acetaminophen (TYLENOL) tablet 650 mg, 650 mg, Oral, Q6H PRN, 650 mg at 06/16/18 1024 **OR** acetaminophen (TYLENOL) suppository 650 mg, 650 mg, Rectal, Q6H PRN, Lance Coon, MD .  aspirin EC tablet 81 mg, 81 mg, Oral, Daily, Lance Coon, MD, 81 mg at 06/16/18 0957 .  atorvastatin (LIPITOR) tablet 40 mg, 40 mg, Oral, q1800, Lance Coon, MD, 40 mg at 06/16/18 1657 .  ceFEPIme (MAXIPIME) 2 g in sodium chloride 0.9 % 100 mL IVPB, 2 g, Intravenous, Q8H, Arta Silence, MD, Stopped at 06/17/18 0049 .  diltiazem (CARDIZEM CD) 24 hr capsule 300 mg, 300 mg, Oral, Daily, Wieting, Richard, MD .  doxycycline (VIBRA-TABS) tablet 100 mg, 100 mg, Oral, BID WC, Wieting, Richard, MD, 100 mg at 06/16/18 1657 .  enoxaparin (LOVENOX) injection 40  mg, 40 mg, Subcutaneous, Q24H, Lance Coon, MD, 40 mg at 06/16/18 2003 .  guaiFENesin-dextromethorphan (ROBITUSSIN DM) 100-10 MG/5ML syrup 5 mL, 5 mL, Oral, Q4H PRN, Lance Coon, MD .  levalbuterol [facility]) nebulizer solution 1.25 mg, 1.25 mg, Nebulization, Q6H PRN, Lance Coon, MD .  methylPREDNISolone sodium succinate (SOLU-MEDROL) 40 mg/mL injection 40 mg, 40 mg, Intravenous, Q12H, Loletha Grayer, MD, 40 mg at 06/17/18 0519 .  mometasone-formoterol (DULERA) 200-5 MCG/ACT inhaler 2 puff, 2 puff, Inhalation, BID, Lance Coon, MD, 2 puff at 06/16/18 2003 .  ondansetron (ZOFRAN) tablet 4 mg, 4 mg, Oral, Q6H PRN **OR** ondansetron (ZOFRAN) injection 4 mg, 4 mg, Intravenous, Q6H PRN, Lance Coon, MD  Facility-Administered Medications Ordered in Other Encounters:  .  sodium chloride flush (NS) 0.9 % injection 10 mL, 10 mL, Intracatheter, PRN, Sindy Guadeloupe, MD .  sodium chloride flush (NS) 0.9 % injection 10 mL, 10 mL, Intravenous, PRN, Sindy Guadeloupe, MD, 10 mL at 06/14/18 0907   Physical exam:  Vitals:   06/16/18 0424 06/16/18 1303 06/16/18 2051  06/17/18 0419  BP: 122/69 129/72 110/68 117/81  Pulse: (!) 102 (!) 101 88 95  Resp: 19 18  (!) 21  Temp: 98.6 F (37 C) 97.9 F (36.6 C) (!) 97.4 F (36.3 C) (!) 97.3 F (36.3 C)  TempSrc: Oral Oral Oral Oral  SpO2: 98% 96% 95% 93%  Weight:      Height:       Physical Exam Constitutional:      Comments: Thin frail elderly gentleman who appears short of breath  HENT:     Head: Normocephalic and atraumatic.  Eyes:     Pupils: Pupils are equal, round, and reactive to light.  Neck:     Musculoskeletal: Normal range of motion.  Cardiovascular:     Rate and Rhythm: Normal rate and regular rhythm.     Heart sounds: Normal heart sounds.  Pulmonary:     Comments: Scattered bibasilar rhonchi.  Effort of breathing is mildly increased.  He is on 2 L of oxygen Abdominal:     General: Bowel sounds are normal.     Palpations: Abdomen is soft.  Skin:    General: Skin is warm and dry.  Neurological:     Mental Status: He is alert and oriented to person, place, and time.        CMP Latest Ref Rng & Units 06/16/2018  Glucose 70 - 99 mg/dL 109(H)  BUN 8 - 23 mg/dL 8  Creatinine 0.61 - 1.24 mg/dL 0.65  Sodium 135 - 145 mmol/L 133(L)  Potassium 3.5 - 5.1 mmol/L 3.5  Chloride 98 - 111 mmol/L 104  CO2 22 - 32 mmol/L 23  Calcium 8.9 - 10.3 mg/dL 7.8(L)  Total Protein 6.5 - 8.1 g/dL -  Total Bilirubin 0.3 - 1.2 mg/dL -  Alkaline Phos 38 - 126 U/L -  AST 15 - 41 U/L -  ALT 0 - 44 U/L -   CBC Latest Ref Rng & Units 06/16/2018  WBC 4.0 - 10.5 K/uL 4.1  Hemoglobin 13.0 - 17.0 g/dL 8.4(L)  Hematocrit 39.0 - 52.0 % 25.8(L)  Platelets 150 - 400 K/uL 179    _0 @  Ct Angio Chest Pe W Or Wo Contrast  Result Date: 06/07/2018 CLINICAL DATA:  73 year old male with history of adenocarcinoma of the lung. Shortness of breath. Supraventricular tachycardia. EXAM: CT ANGIOGRAPHY CHEST WITH CONTRAST TECHNIQUE: Multidetector CT imaging of the chest was performed using the standard protocol during  bolus administration of intravenous contrast. Multiplanar CT image reconstructions and MIPs were obtained to evaluate the vascular anatomy. CONTRAST:  23m OMNIPAQUE IOHEXOL 350 MG/ML SOLN COMPARISON:  PET-CT 03/23/2018.  Chest CT 03/17/2018. FINDINGS: Cardiovascular: No filling defects within the pulmonary arterial tree to suggest underlying pulmonary embolism. Heart size is mildly enlarged. There is aortic atherosclerosis, as well as atherosclerosis of the great vessels of the mediastinum and the coronary arteries, including calcified atherosclerotic plaque in the left main and left anterior descending coronary arteries. Right internal jugular single-lumen porta cath with tip terminating in the distal superior vena cava. Mediastinum/Nodes: Multiple prominent borderline enlarged and enlarged mediastinal and hilar lymph nodes, largest of which is a subcarinal lymph node measuring up to 2.1 cm in short axis. Esophagus is unremarkable in appearance. Esophagus is unremarkable in appearance. No axillary lymphadenopathy. Lungs/Pleura: Widespread but patchy areas of ground-glass attenuation and septal thickening scattered throughout the lungs bilaterally, significantly increased compared to the prior study from October 2019. Scattered areas of cylindrical and mild varicose bronchiectasis. Some thick-walled cystic changes are noted in the right upper lobe. Volume loss and scarring in the right middle lobe. Trace bilateral pleural effusions. Upper Abdomen: Unremarkable. Musculoskeletal: There are no aggressive appearing lytic or blastic lesions noted in the visualized portions of the skeleton. Review of the MIP images confirms the above findings. IMPRESSION: 1. No evidence of pulmonary embolism. 2. The appearance the chest suggests severe multilobar pneumonia. There also scattered areas of what appear to be post infectious or inflammatory scarring in the lungs bilaterally. 3. Aortic atherosclerosis, in addition to left main  and left anterior descending coronary artery disease. Assessment for potential risk factor modification, dietary therapy or pharmacologic therapy may be warranted, if clinically indicated. Aortic Atherosclerosis (ICD10-I70.0). Electronically Signed   By: DVinnie LangtonM.D.   On: 06/07/2018 19:20   Dg Chest Portable 1 View  Result Date: 06/15/2018 CLINICAL DATA:  Shortness of breath. EXAM: PORTABLE CHEST 1 VIEW COMPARISON:  Radiographs and CT 06/07/2018 FINDINGS: Right chest port in the mid SVC. Unchanged heart size and mediastinal contours. Mild chronic volume loss in the right lung. Diffuse interstitial opacities corresponding to ground-glass opacities on CT. Slight improvement in the left lung from prior exam. Small pleural effusions on CT are not seen radiographically. No pneumothorax. Unchanged osseous structures. IMPRESSION: 1. Diffuse lung opacities with slight improvement in the left lung from prior exam. Unchanged appearance of the right hemithorax. CT findings suspicious for pneumonia. 2. No new abnormalities. Electronically Signed   By: MKeith RakeM.D.   On: 06/15/2018 21:32   Dg Chest Portable 1 View  Result Date: 06/07/2018 CLINICAL DATA:  Two day history of dizziness and weakness. History of lung malignancy and hypertension. Discontinued smoking in October 2019 EXAM: PORTABLE CHEST 1 VIEW COMPARISON:  CT scan of the chest of March 17, 2018 and PET-CT study of March 23, 2018. FINDINGS: The lungs are well-expanded. The interstitial markings are coarse especially in the right infrahilar region. There is no alveolar infiltrate. The heart is top-normal in size. The pulmonary vascularity is not engorged. External pacemaker defibrillator pads are present. A porta catheter is present with the tip projecting over the distal third of the SVC. IMPRESSION: No focal pneumonia. Mild interstitial prominence bilaterally which is likely chronic. Top-normal cardiac size without pulmonary edema.  Electronically Signed   By: David  JMartiniqueM.D.   On: 06/07/2018 09:45    Assessment and plan- Patient is a 73y.o. male with stage III adenocarcinoma  of the lung T2 N3 M0 status post 4 cycles of concurrent chemoradiation who restarted radiation 2 days ago.  He is now readmitted to the hospital for healthcare associated pneumonia  1.  Healthcare associated pneumonia: Blood cultures and influenza testing have been negative so far.  Recommend continuing IV antibiotics for 2 to 3 days since he failed a trial of outpatient oral antibiotics.  If he fails to improve clinically it may be worthwhile having pulmonary on board.  2.  Normocytic anemia: Likely secondary to recent chemotherapy which was about a month ago on 05/17/2018 in addition to ongoing acute pneumonia.  Continue to monitor and transfuse if hemoglobin less than 7.  3.  Stage III lung cancer: His chemoradiation will be on hold while he is hospitalized and we will reevaluate him as an outpatient if he can restart his treatment based on his performance status     Visit Diagnosis 1. HCAP (healthcare-associated pneumonia)   2. Hyponatremia     Dr. Randa Evens, MD, MPH [facility] at [facility] [phone] 06/17/2018 8:22 AM

## 2018-06-17 NOTE — Progress Notes (Signed)
Pharmacy Antibiotic Note  Philip Weiss is a 73 y.o. male admitted on 06/15/2018 with pneumonia.  Pharmacy has been consulted for cefepime dosing.  Plan: Vancomycin was initially ordered but then switched to oral doxycycline.  Continue cefepime 2 grams q 8 hours as ordered.  Height: 5\' 9"  (175.3 cm) Weight: 151 lb 3.8 oz (68.6 kg) IBW/kg (Calculated) : 70.7  Temp (24hrs), Avg:97.5 F (36.4 C), Min:97.3 F (36.3 C), Max:97.9 F (36.6 C)  Recent Labs  Lab 06/14/18 0903 06/15/18 2109 06/15/18 2117 06/16/18 0438  WBC 5.4 6.6  --  4.1  CREATININE 0.66 0.72  --  0.65  LATICACIDVEN  --   --  1.41  --     Estimated Creatinine Clearance: 78.6 mL/min (by C-G formula based on SCr of 0.65 mg/dL).    Allergies  Allergen Reactions  . Aspirin Itching    Patient states may not be allergic, he was taking too many aspirin and became itchy. As long as he takes one tablet daily, he is fine    Antimicrobials this admission: Vancomycin, cefepime  >>    >>   Dose adjustments this admission:   Microbiology results: 1/8 BCx: pending 1/9 MRSA PCR: pending      1/8 CXR suspicious for pneumonia  Thank you for allowing pharmacy to be a part of this patient's care.  Laural Benes 06/17/2018 8:07 AM

## 2018-06-17 NOTE — Care Management Important Message (Signed)
Important Message  Patient Details  Name: Philip Weiss MRN: [id number] Date of Birth: 03/21/1946   Medicare Important Message Given:  Yes    Juliann Pulse A Malika Demario 06/17/2018, 11:09 AM

## 2018-06-17 NOTE — Care Management Note (Signed)
Case Management Note  Patient Details  Name: SOSAIA PITTINGER MRN: [id number] Date of Birth: 11-04-1945  Subjective/Objective:      Admitted to [facility] with the diagnosis of acute respiratory failure. Lives with wife, Artelia Laroche [phone]). [facility] 2 weeks ago. Gets prescriptions filled at [facility].  No home health, only assessment from insurance company yearly. No skilled nursing. No home oxygen. No medical equipment in the home. Takes care of all basic activities of daily living himself, doesn't drive. Wife helps with errands.  No falls. Fair appetite. Finished radiation/chemotherapy. Hx Lung Cancer.  Acute oxygen in place.               Action/Plan: Will continue to follow for transition of care needs.   Expected Discharge Date:                  Expected Discharge Plan:     In-House Referral:   yes  Discharge planning Services     Post Acute Care Choice:    Choice offered to:     DME Arranged:    DME Agency:     HH Arranged:    HH Agency:     Status of Service:     If discussed at [employer], dates discussed:    Additional Comments:  Shelbie Ammons, RN MSN CCM Care Management [phone] 06/17/2018, 1:37 PM

## 2018-06-18 NOTE — Progress Notes (Signed)
SATURATION QUALIFICATIONS: (This note is used to comply with regulatory documentation for home oxygen)  Patient Saturations on Room Air at Rest = 88%  Patient Saturations on Room Air while Ambulating = 86%  Patient Saturations on 2 Liters of oxygen while Ambulating = %  Please briefly explain why patient needs home oxygen: Pt saturations on 2 L O2 when at rest is ranging from 96% to 100%

## 2018-06-18 NOTE — Progress Notes (Signed)
Patient ID: Philip Weiss, male   DOB: 1945/11/10, 73 y.o.   MRN: [id number]  Philip Weiss PROGRESS NOTE  Philip Weiss Philip Weiss:[id number] DOB: 04-Apr-1946 Philip Weiss: 06/15/2018 Philip Weiss: Philip Weiss, Philip Weiss  HPI/Subjective: Patient feels okay at rest.  When he starts walking around that is when he gets short of breath and tachycardic.  Some cough.  Some shortness of breath.  He would need home oxygen.  Objective: Vitals:   06/18/18 0415 06/18/18 1315  BP: 120/78 127/74  Pulse: 88 89  Resp: (!) 22 18  Temp: 97.9 F (36.6 C)   SpO2: 97% 94%    Filed Weights   06/16/18 0316 06/16/18 0359  Weight: 68.6 kg 68.6 kg    ROS: Review of Systems  Constitutional: Negative for chills and fever.  Eyes: Negative for blurred vision.  Respiratory: Positive for cough and shortness of breath.   Cardiovascular: Negative for chest pain.  Gastrointestinal: Negative for abdominal pain, constipation, diarrhea, nausea and vomiting.  Genitourinary: Negative for dysuria.  Musculoskeletal: Negative for joint pain.  Neurological: Negative for dizziness and headaches.   Exam: Physical Exam  Constitutional: He is oriented to person, place, and time.  HENT:  Nose: No mucosal edema.  Mouth/Throat: No oropharyngeal exudate or posterior oropharyngeal edema.  Eyes: Pupils are equal, round, and reactive to light. Conjunctivae, EOM and lids are normal.  Neck: No JVD present. Carotid bruit is not present. No edema present. No thyroid mass and no thyromegaly present.  Cardiovascular: S1 normal and S2 normal. Exam reveals no gallop.  No murmur heard. Pulses:      Dorsalis pedis pulses are 2+ on the right side and 2+ on the left side.  Respiratory: No respiratory distress. He has decreased breath sounds in the right middle field, the right lower field, the left middle field and the left lower field. He has no wheezes. He has no rhonchi. He has no rales.  GI: Soft. Bowel sounds are normal. There is no abdominal  tenderness.  Musculoskeletal:     Right ankle: He exhibits no swelling.     Left ankle: He exhibits no swelling.  Lymphadenopathy:    He has no cervical adenopathy.  Neurological: He is alert and oriented to person, place, and time. No cranial nerve deficit.  Skin: Skin is warm. No rash noted. Nails show no clubbing.  Psychiatric: He has a normal mood and affect.      Data Reviewed: Basic Metabolic Panel: Recent Labs  Lab 06/14/18 0903 06/15/18 2109 06/16/18 0438  NA 134* 127* 133*  K 3.0* 3.1* 3.5  CL 100 94* 104  CO2 25 23 23   GLUCOSE 126* 115* 109*  BUN 7* 9 8  CREATININE 0.66 0.72 0.65  CALCIUM 8.5* 8.3* 7.8*   Liver Function Tests: Recent Labs  Lab 06/14/18 0903  AST 34  ALT 58*  ALKPHOS 82  BILITOT 0.5  PROT 6.3*  ALBUMIN 2.5*   CBC: Recent Labs  Lab 06/14/18 0903 06/15/18 2109 06/16/18 0438  WBC 5.4 6.6 4.1  NEUTROABS 3.8 4.4  --   HGB 9.3* 9.7* 8.4*  HCT 28.6* 29.7* 25.8*  MCV 93.8 95.2 95.2  PLT 195 207 179   Cardiac Enzymes: Recent Labs  Lab 06/15/18 2109  TROPONINI 0.03*   BNP (last 3 results) Recent Labs    06/15/18 2109  BNP 51.0     Recent Results (from the past 240 hour(s))  Culture, blood (Routine X 2) w Reflex to ID Panel  Status: None (Preliminary result)   Collection Time: 06/16/18 12:10 AM  Result Value Ref Range Status   Specimen Description BLOOD RIGHT ANTECUBITAL  Final   Special Requests   Final    BOTTLES DRAWN AEROBIC AND ANAEROBIC Blood Culture results may not be optimal due to an excessive volume of blood received in culture bottles   Culture   Final    NO GROWTH 2 DAYS Performed at [facility], [address] [place], [place] [address]    Report Status PENDING  Incomplete  Culture, blood (Routine X 2) w Reflex to ID Panel     Status: None (Preliminary result)   Collection Time: 06/16/18 12:10 AM  Result Value Ref Range Status   Specimen Description BLOOD BLOOD RIGHT FOREARM  Final   Special  Requests   Final    BOTTLES DRAWN AEROBIC AND ANAEROBIC Blood Culture adequate volume   Culture   Final    NO GROWTH 2 DAYS Performed at [facility], [address] [place], [place] [address]    Report Status PENDING  Incomplete  MRSA PCR Screening     Status: None   Collection Time: 06/16/18  3:41 AM  Result Value Ref Range Status   MRSA by PCR NEGATIVE NEGATIVE Final    Comment:        The GeneXpert MRSA Assay (FDA approved for NASAL specimens only), is one component of a comprehensive MRSA colonization surveillance program. It is not intended to diagnose MRSA infection nor to guide or monitor treatment for MRSA infections. Performed at [facility], [address] [place], [place] [address]      Studies: No results found.  Scheduled Meds: . aspirin EC  81 mg Oral Daily  . atorvastatin  40 mg Oral q1800  . diltiazem  300 mg Oral Daily  . doxycycline  100 mg Oral BID WC  . enoxaparin (LOVENOX) injection  40 mg Subcutaneous Q24H  . methylPREDNISolone (SOLU-MEDROL) injection  40 mg Intravenous Q12H  . mometasone-formoterol  2 puff Inhalation BID   Continuous Infusions: . sodium chloride Stopped (06/17/18 0113)  . ceFEPime (MAXIPIME) IV 2 g (06/18/18 1017)    Assessment/Plan:  1. Acute hypoxic respiratory failure.  So far patient is unable to come off oxygen because his pulse ox dropped down into the 80s with ambulation just a few steps into the hallway in spite of getting all the treatments for COPD.   will need home oxygen.  His saturation had recovered and stable after using supplemental oxygen.  I put in a DME nebulizer for home nebulizer. 2. COPD exacerbation and recent pneumonia.  Sepsis ruled out.  Patient on aggressive antibiotics.  Case discussed with patient's pulmonologist Dr. Patsey Berthold.  She will follow-up in a couple weeks. 3. SVT.  Increased Cardizem CD to 300 mg daily 4. Adenocarcinoma lung stage III.  Follow-up with oncology and  radiation therapy as outpatient. 5. Recent stroke on aspirin and atorvastatin 6. Hyperlipidemia unspecified on atorvastatin  Code Status:     Code Status Orders  (From admission, onward)         Start     Ordered   06/16/18 0220  Do not attempt resuscitation (DNR)  Continuous    Question Answer Comment  In the event of cardiac or respiratory ARREST Do not call a "code blue"   In the event of cardiac or respiratory ARREST Do not perform Intubation, CPR, defibrillation or ACLS   In the event of cardiac or  respiratory ARREST Use medication by any route, position, wound care, and other measures to relive pain and suffering. May use oxygen, suction and manual treatment of airway obstruction as needed for comfort.   Comments nurse may pronounce      06/16/18 0219        Code Status History    Date Active Date Inactive Code Status Order ID Comments User Context   06/07/2018 1236 06/10/2018 1822 DNR [id number]  Loletha Grayer, MD ED   04/07/2018 1647 04/08/2018 2059 DNR [id number]  Loletha Grayer, MD ED    Advance Directive Documentation     Most Recent Value  Type of Advance Directive  Healthcare Power of [place], Out of facility DNR (pink MOST or yellow form)  Pre-existing out of facility DNR order (yellow form or pink MOST form)  -  "MOST" Form in Place?  -     Family Communication: Wife at the bedside Disposition Plan: To be determined  Antibiotics:  Cefepime  Doxycycline  Time spent: 27 minutes  [employer]

## 2018-06-18 NOTE — Plan of Care (Signed)
  Problem: Education: Goal: Knowledge of General Education information will improve Description Including pain rating scale, medication(s)/side effects and non-pharmacologic comfort measures Outcome: Progressing   Problem: Health Behavior/Discharge Planning: Goal: Ability to manage health-related needs will improve Outcome: Progressing   Problem: Clinical Measurements: Goal: Ability to maintain clinical measurements within normal limits will improve Outcome: Progressing Goal: Diagnostic test results will improve Outcome: Progressing   Problem: Activity: Goal: Risk for activity intolerance will decrease Outcome: Progressing   Problem: Nutrition: Goal: Adequate nutrition will be maintained Outcome: Progressing   Problem: Safety: Goal: Ability to remain free from injury will improve Outcome: Progressing   Problem: Skin Integrity: Goal: Risk for impaired skin integrity will decrease Outcome: Progressing

## 2018-06-18 NOTE — Progress Notes (Signed)
Pt ambulated w/ 2L / [place]. Around nursing station. Tolerated well

## 2018-06-19 MED ORDER — LEVALBUTEROL HCL 1.25 MG/0.5ML IN NEBU: 1.2500 mg | INHALATION_SOLUTION | Freq: Four times a day (QID) | RESPIRATORY_TRACT | 1 refills | Status: AC | PRN

## 2018-06-19 MED ORDER — DILTIAZEM HCL ER COATED BEADS 300 MG PO CP24: 180.0000 mg | ORAL_CAPSULE | Freq: Every day | ORAL | 0 refills | Status: AC

## 2018-06-19 MED ORDER — HEPARIN SOD (PORK) LOCK FLUSH 100 UNIT/ML IV SOLN
500.0000 [IU] | Freq: Once | INTRAVENOUS | Status: AC
  Administered 2018-06-19: 500 [IU] via INTRAVENOUS
  Filled 2018-06-19: qty 5

## 2018-06-19 MED ORDER — PREDNISONE 10 MG (21) PO TBPK: ORAL_TABLET | ORAL | 0 refills | Status: DC

## 2018-06-19 MED ORDER — DOXYCYCLINE HYCLATE 100 MG PO TABS: 100.0000 mg | ORAL_TABLET | Freq: Two times a day (BID) | ORAL | 0 refills | Status: AC

## 2018-06-19 NOTE — Discharge Summary (Signed)
[facility] NAME: Philip Weiss    MR#:  [id number]  DATE OF BIRTH:  08/26/1945  DATE OF ADMISSION:  06/15/2018 ADMITTING PHYSICIAN: Lance Coon, MD  DATE OF DISCHARGE: 06/19/2018   PRIMARY CARE PHYSICIAN: [facility]    ADMISSION DIAGNOSIS:  Hyponatremia [E87.1] HCAP (healthcare-associated pneumonia) [J18.9]  DISCHARGE DIAGNOSIS:  Principal Problem:   Acute respiratory failure with hypoxia ([place]) Active Problems:   Lung cancer ([place])   HCAP (healthcare-associated pneumonia)   HTN (hypertension)   Sepsis ([place])   SECONDARY DIAGNOSIS:   Past Medical History:  Diagnosis Date  . CVA (cerebral vascular accident) ([place])   . Hypertension   . Lung cancer [facility])     HOSPITAL COURSE:   1. Acute hypoxic respiratory failure.  So far patient is unable to come off oxygen because his pulse ox dropped down into the 80s with ambulation just a few steps into the hallway in spite of getting all the treatments for COPD.   will need home oxygen.  His saturation had recovered and stable after using supplemental oxygen. Also need DME nebulizer for home nebulizer. 2. COPD exacerbation and recent pneumonia.  Sepsis ruled out.  Patient on aggressive antibiotics.  Case discussed with patient's pulmonologist Dr. Patsey Berthold.  She will follow-up in a couple weeks. Improving finish total 10 days of Abx. 3. SVT.  Increased Cardizem CD to 300 mg daily 4. Adenocarcinoma lung stage III.  Follow-up with oncology and radiation therapy as outpatient. 5. Recent stroke on aspirin and atorvastatin 6. Hyperlipidemia unspecified on atorvastatin  DISCHARGE CONDITIONS:   Stable.  CONSULTS OBTAINED:  Treatment Team:  Sindy Guadeloupe, MD  DRUG ALLERGIES:   Allergies  Allergen Reactions  . Aspirin Itching    Patient states may not be allergic, he was taking too many aspirin and became itchy. As long as he takes one tablet daily, he is fine     DISCHARGE MEDICATIONS:   Allergies as of 06/19/2018      Reactions   Aspirin Itching   Patient states may not be allergic, he was taking too many aspirin and became itchy. As long as he takes one tablet daily, he is fine      Medication List    STOP taking these medications   predniSONE 20 MG tablet Commonly known as:  DELTASONE Replaced by:  predniSONE 10 MG (21) Tbpk tablet     TAKE these medications   albuterol 108 (90 Base) MCG/ACT inhaler Commonly known as:  PROVENTIL HFA;VENTOLIN HFA Inhale 2 puffs into the lungs every 6 (six) hours as needed for wheezing or shortness of breath.   aspirin 81 MG EC tablet Take 1 tablet (81 mg total) by mouth daily.   atorvastatin 40 MG tablet Commonly known as:  LIPITOR Take 1 tablet (40 mg total) by mouth daily at 6 PM.   budesonide-formoterol 160-4.5 MCG/ACT inhaler Commonly known as:  SYMBICORT Inhale 2 puffs into the lungs 2 (two) times daily.   diltiazem 300 MG 24 hr capsule Commonly known as:  CARDIZEM CD Take 1 capsule (300 mg total) by mouth daily. What changed:    medication strength  how much to take   doxycycline 100 MG tablet Commonly known as:  VIBRA-TABS Take 1 tablet (100 mg total) by mouth 2 (two) times daily with a meal for 5 days.   guaiFENesin-dextromethorphan 100-10 MG/5ML syrup Commonly known as:  ROBITUSSIN DM Take 5 mLs by mouth every  4 (four) hours as needed for cough.   levalbuterol 1.25 MG/0.5ML nebulizer solution Commonly known as:  XOPENEX Take 1.25 mg by nebulization every 6 (six) hours as needed for wheezing or shortness of breath.   MULTIVITAMIN PO Take 1 tablet by mouth daily.   predniSONE 10 MG (21) Tbpk tablet Commonly known as:  STERAPRED UNI-PAK 21 TAB Take 6 tabs first day, 5 tab on day 2, then 4 on day 3rd, 3 tabs on day 4th , 2 tab on day 5th, and 1 tab on 6th day. Replaces:  predniSONE 20 MG tablet   VITAMIN B-12 PO Take 1 tablet by mouth daily.   VITAMIN B-6 PO Take 1  tablet by mouth daily.            Durable Medical Equipment  (From admission, onward)         Start     Ordered   06/18/18 1514  For home use only DME oxygen  Once    Question Answer Comment  Mode or (Route) Nasal cannula   Liters per Minute 2   Frequency Continuous (stationary and portable oxygen unit needed)   Oxygen conserving device Yes   Oxygen delivery system Gas      06/18/18 1513   06/17/18 1114  For home use only DME Nebulizer machine  Once    Question:  Patient needs a nebulizer to treat with the following condition  Answer:  COPD exacerbation ([place])   06/17/18 1113           DISCHARGE INSTRUCTIONS:   Follow-up with cancer center in 1 week.  If you experience worsening of your admission symptoms, develop shortness of breath, life threatening emergency, suicidal or homicidal thoughts you must seek medical attention immediately by calling 911 or calling your MD immediately  if symptoms less severe.  You Must read complete instructions/literature along with all the possible adverse reactions/side effects for all the Medicines you take and that have been prescribed to you. Take any new Medicines after you have completely understood and accept all the possible adverse reactions/side effects.   Please note  You were cared for by a hospitalist during your hospital stay. If you have any questions about your discharge medications or the care you received while you were in the hospital after you are discharged, you can call the unit and asked to speak with the hospitalist on call if the hospitalist that took care of you is not available. Once you are discharged, your primary care physician will handle any further medical issues. Please note that NO REFILLS for any discharge medications will be authorized once you are discharged, as it is imperative that you return to your primary care physician (or establish a relationship with a primary care physician if you do not have one)  for your aftercare needs so that they can reassess your need for medications and monitor your lab values.    Today   CHIEF COMPLAINT:   Chief Complaint  Patient presents with  . Shortness of Breath    HISTORY OF PRESENT ILLNESS:  Philip Weiss  is a 73 y.o. male with a known history of adenocarcinoma of the lung.  Patient was sent in for SVT.  Patient has had a headache and some shortness of breath.  His blood pressures been low and his pulse is been high for couple days.  He has been feeling fatigued.  He has had some weight loss.  He occasionally feels palpitations.  No complaints of  chest pain.  Family states he finished his radiation therapy on December 16 and he is taking a break from that.  His last chemotherapy was before that.  Hospitalist services were contacted for further evaluation.  In the ER he was given adenosine and then given short acting Cardizem.   VITAL SIGNS:  Blood pressure 130/74, pulse 96, temperature 97.8 F (36.6 C), temperature source Oral, resp. rate 19, height 5\' 9"  (1.753 m), weight 68.6 kg, SpO2 98 %.  I/O:    Intake/Output Summary (Last 24 hours) at 06/19/2018 1335 Last data filed at 06/19/2018 1200 Gross per 24 hour  Intake 720 ml  Output -  Net 720 ml    PHYSICAL EXAMINATION:   Constitutional: He is oriented to person, place, and time.  HENT:  Nose: No mucosal edema.  Mouth/Throat: No oropharyngeal exudate or posterior oropharyngeal edema.  Eyes: Pupils are equal, round, and reactive to light. Conjunctivae, EOM and lids are normal.  Neck: No JVD present. Carotid bruit is not present. No edema present. No thyroid mass and no thyromegaly present.  Cardiovascular: S1 normal and S2 normal. Exam reveals no gallop.  No murmur heard. Pulses:      Dorsalis pedis pulses are 2+ on the right side and 2+ on the left side.  Respiratory: No respiratory distress.  He has no wheezes. He has no rhonchi. He has no rales.  On supplemental oxygen. GI: Soft. Bowel  sounds are normal. There is no abdominal tenderness.  Musculoskeletal:     Right ankle: He exhibits no swelling.     Left ankle: He exhibits no swelling.  Lymphadenopathy:    He has no cervical adenopathy.  Neurological: He is alert and oriented to person, place, and time. No cranial nerve deficit.  Skin: Skin is warm. No rash noted. Nails show no clubbing.  Psychiatric: He has a normal mood and affect.   DATA REVIEW:   CBC Recent Labs  Lab 06/16/18 0438  WBC 4.1  HGB 8.4*  HCT 25.8*  PLT 179    Chemistries  Recent Labs  Lab 06/14/18 0903  06/16/18 0438  NA 134*   < > 133*  K 3.0*   < > 3.5  CL 100   < > 104  CO2 25   < > 23  GLUCOSE 126*   < > 109*  BUN 7*   < > 8  CREATININE 0.66   < > 0.65  CALCIUM 8.5*   < > 7.8*  AST 34  --   --   ALT 58*  --   --   ALKPHOS 82  --   --   BILITOT 0.5  --   --    < > = values in this interval not displayed.    Cardiac Enzymes Recent Labs  Lab 06/15/18 2109  TROPONINI 0.03*    Microbiology Results  Results for orders placed or performed during the hospital encounter of 06/15/18  Culture, blood (Routine X 2) w Reflex to ID Panel     Status: None (Preliminary result)   Collection Time: 06/16/18 12:10 AM  Result Value Ref Range Status   Specimen Description BLOOD RIGHT ANTECUBITAL  Final   Special Requests   Final    BOTTLES DRAWN AEROBIC AND ANAEROBIC Blood Culture results may not be optimal due to an excessive volume of blood received in culture bottles   Culture   Final    NO GROWTH 3 DAYS Performed at [facility], [address] [place], [place] [address]    Report Status PENDING  Incomplete  Culture, blood (Routine X 2) w Reflex to ID Panel     Status: None (Preliminary result)   Collection Time: 06/16/18 12:10 AM  Result Value Ref Range Status   Specimen Description BLOOD BLOOD RIGHT FOREARM  Final   Special Requests   Final    BOTTLES DRAWN AEROBIC AND ANAEROBIC Blood Culture adequate volume    Culture   Final    NO GROWTH 3 DAYS Performed at [facility], [address], [place], [place] [address]    Report Status PENDING  Incomplete  MRSA PCR Screening     Status: None   Collection Time: 06/16/18  3:41 AM  Result Value Ref Range Status   MRSA by PCR NEGATIVE NEGATIVE Final    Comment:        The GeneXpert MRSA Assay (FDA approved for NASAL specimens only), is one component of a comprehensive MRSA colonization surveillance program. It is not intended to diagnose MRSA infection nor to guide or monitor treatment for MRSA infections. Performed at [facility], [address] [place], [place] [address]     RADIOLOGY:  No results found.  EKG:   Orders placed or performed during the hospital encounter of 06/15/18  . ED EKG  . ED EKG      Management plans discussed with the patient, family and they are in agreement.  CODE STATUS:     Code Status Orders  (From admission, onward)         Start     Ordered   06/16/18 0220  Do not attempt resuscitation (DNR)  Continuous    Question Answer Comment  In the event of cardiac or respiratory ARREST Do not call a "code blue"   In the event of cardiac or respiratory ARREST Do not perform Intubation, CPR, defibrillation or ACLS   In the event of cardiac or respiratory ARREST Use medication by any route, position, wound care, and other measures to relive pain and suffering. May use oxygen, suction and manual treatment of airway obstruction as needed for comfort.   Comments nurse may pronounce      06/16/18 0219        Code Status History    Date Active Date Inactive Code Status Order ID Comments User Context   06/07/2018 1236 06/10/2018 1822 DNR [id number]  Loletha Grayer, MD ED   04/07/2018 1647 04/08/2018 2059 DNR [id number]  Loletha Grayer, MD ED    Advance Directive Documentation     Most Recent Value  Type of Advance Directive  Healthcare Power of [place], Out of facility DNR (pink MOST  or yellow form)  Pre-existing out of facility DNR order (yellow form or pink MOST form)  -  "MOST" Form in Place?  -      TOTAL TIME TAKING CARE OF THIS PATIENT: 35 minutes.    Vaughan Basta M.D on 06/19/2018 at 1:35 PM  Between 7am to 6pm - Pager - [phone]  After 6pm go to [email or web address] - password [email or web address] [place] [facility]  Office  [phone]  CC: Primary care physician; [facility]   Note: This dictation was prepared with Dragon dictation along with smaller phrase technology. Any transcriptional errors that result from this process are unintentional.

## 2018-06-19 NOTE — Plan of Care (Signed)
  Problem: Education: Goal: Knowledge of General Education information will improve Description Including pain rating scale, medication(s)/side effects and non-pharmacologic comfort measures Outcome: Progressing   Problem: Health Behavior/Discharge Planning: Goal: Ability to manage health-related needs will improve Outcome: Progressing   Problem: Clinical Measurements: Goal: Ability to maintain clinical measurements within normal limits will improve Outcome: Progressing Goal: Diagnostic test results will improve Outcome: Progressing   Problem: Activity: Goal: Risk for activity intolerance will decrease Outcome: Progressing   Problem: Nutrition: Goal: Adequate nutrition will be maintained Outcome: Progressing   Problem: Safety: Goal: Ability to remain free from injury will improve Outcome: Progressing   Problem: Skin Integrity: Goal: Risk for impaired skin integrity will decrease Outcome: Progressing

## 2018-06-19 NOTE — Progress Notes (Signed)
Reviewed AVS with pt and family. Pt verbalized understanding. 4 printed rx with AVS

## 2018-06-19 NOTE — Care Management Note (Signed)
Case Management Note  Patient Details  Name: Philip Weiss MRN: [id number] Date of Birth: Apr 21, 1946  Subjective/Objective:  Patient to be discharged per MD order. Orders in place for home health services. CMS Medicare.gov Compare Post Acute Care list reviewed with patient and since patient has comorbidities and is discharging on oxygen patient is agreeable to home health RN. Per preference will use [facility] protocol. DME O2 and Nebulizer delivered to room. Signed protocol sent to [facility]. Family to transport.                  Action/Plan:   Expected Discharge Date:  06/19/18               Expected Discharge Plan:  [place]  In-House Referral:     Discharge planning Services  CM Consult  Post Acute Care Choice:  Home Health, Durable Medical Equipment Choice offered to:  Patient  DME Arranged:  Oxygen, Nebulizer/meds DME Agency:  [place]:  RN, Disease Management [place] Agency:  [place]  Status of Service:  Completed, signed off  If discussed at [place] of Stay Meetings, dates discussed:    Additional Comments:  Latanya Maudlin, RN 06/19/2018, 1:05 PM

## 2018-06-20 ENCOUNTER — Telehealth: Payer: Self-pay | Admitting: *Deleted

## 2018-06-20 ENCOUNTER — Ambulatory Visit: Payer: Medicare PPO

## 2018-06-20 NOTE — Telephone Encounter (Signed)
Her husband just left hospital yest. Does he need to keep his chemo appt for tomm?. I spoke with Dr Janese Banks. If he is having his radiation treatment tomm. She will see him but does not plan on giving him chemo tomorrow. We will not be accessing his port tomm. If we need labs we will get them from his arm. I called radiation they are doing his treatment tomm am.

## 2018-06-21 ENCOUNTER — Ambulatory Visit: Payer: Medicare PPO

## 2018-06-21 ENCOUNTER — Encounter: Payer: Self-pay | Admitting: Oncology

## 2018-06-21 ENCOUNTER — Inpatient Hospital Stay: Payer: Medicare PPO

## 2018-06-21 ENCOUNTER — Inpatient Hospital Stay (HOSPITAL_BASED_OUTPATIENT_CLINIC_OR_DEPARTMENT_OTHER): Payer: Medicare PPO | Admitting: Oncology

## 2018-06-21 VITALS — BP 122/73 | HR 97 | Temp 97.7°F | Resp 16 | Wt 151.9 lb

## 2018-06-21 DIAGNOSIS — D6481 Anemia due to antineoplastic chemotherapy: Secondary | ICD-10-CM

## 2018-06-21 DIAGNOSIS — R5383 Other fatigue: Secondary | ICD-10-CM | POA: Diagnosis not present

## 2018-06-21 DIAGNOSIS — C342 Malignant neoplasm of middle lobe, bronchus or lung: Secondary | ICD-10-CM

## 2018-06-21 DIAGNOSIS — T451X5A Adverse effect of antineoplastic and immunosuppressive drugs, initial encounter: Secondary | ICD-10-CM

## 2018-06-21 DIAGNOSIS — Z79899 Other long term (current) drug therapy: Secondary | ICD-10-CM | POA: Diagnosis not present

## 2018-06-21 DIAGNOSIS — T451X5S Adverse effect of antineoplastic and immunosuppressive drugs, sequela: Secondary | ICD-10-CM | POA: Diagnosis not present

## 2018-06-21 DIAGNOSIS — Z9981 Dependence on supplemental oxygen: Secondary | ICD-10-CM

## 2018-06-21 DIAGNOSIS — I700 Atherosclerosis of aorta: Secondary | ICD-10-CM

## 2018-06-21 DIAGNOSIS — Z8673 Personal history of transient ischemic attack (TIA), and cerebral infarction without residual deficits: Secondary | ICD-10-CM | POA: Diagnosis not present

## 2018-06-21 DIAGNOSIS — I10 Essential (primary) hypertension: Secondary | ICD-10-CM

## 2018-06-21 DIAGNOSIS — R5381 Other malaise: Secondary | ICD-10-CM

## 2018-06-21 DIAGNOSIS — F17210 Nicotine dependence, cigarettes, uncomplicated: Secondary | ICD-10-CM | POA: Diagnosis not present

## 2018-06-21 DIAGNOSIS — J189 Pneumonia, unspecified organism: Secondary | ICD-10-CM | POA: Diagnosis not present

## 2018-06-21 DIAGNOSIS — I2510 Atherosclerotic heart disease of native coronary artery without angina pectoris: Secondary | ICD-10-CM | POA: Diagnosis not present

## 2018-06-21 DIAGNOSIS — Z7982 Long term (current) use of aspirin: Secondary | ICD-10-CM

## 2018-06-21 DIAGNOSIS — E876 Hypokalemia: Secondary | ICD-10-CM | POA: Diagnosis not present

## 2018-06-21 DIAGNOSIS — Z8701 Personal history of pneumonia (recurrent): Secondary | ICD-10-CM

## 2018-06-21 LAB — COMPREHENSIVE METABOLIC PANEL
ALT: 82 U/L — AB (ref 0–44)
AST: 53 U/L — ABNORMAL HIGH (ref 15–41)
Albumin: 2.8 g/dL — ABNORMAL LOW (ref 3.5–5.0)
Alkaline Phosphatase: 85 U/L (ref 38–126)
Anion gap: 7 (ref 5–15)
BUN: 10 mg/dL (ref 8–23)
CHLORIDE: 101 mmol/L (ref 98–111)
CO2: 28 mmol/L (ref 22–32)
Calcium: 9.1 mg/dL (ref 8.9–10.3)
Creatinine, Ser: 0.72 mg/dL (ref 0.61–1.24)
GFR calc Af Amer: >60 mL/min (ref >60)
GFR calc non Af Amer: >60 mL/min (ref >60)
GLUCOSE: 104 mg/dL — AB (ref 70–99)
Potassium: 3.7 mmol/L (ref 3.5–5.1)
Sodium: 136 mmol/L (ref 135–145)
TOTAL PROTEIN: 6.3 g/dL — AB (ref 6.5–8.1)
Total Bilirubin: 0.5 mg/dL (ref 0.3–1.2)

## 2018-06-21 LAB — CBC WITH DIFFERENTIAL/PLATELET
Abs Immature Granulocytes: 0.14 10*3/uL — ABNORMAL HIGH (ref 0.00–0.07)
Basophils Absolute: 0 10*3/uL (ref 0.0–0.1)
Basophils Relative: 0 %
Eosinophils Absolute: 0 10*3/uL (ref 0.0–0.5)
Eosinophils Relative: 0 %
HCT: 28.4 % — ABNORMAL LOW (ref 39.0–52.0)
Hemoglobin: 9 g/dL — ABNORMAL LOW (ref 13.0–17.0)
Immature Granulocytes: 1 %
Lymphocytes Relative: 5 %
Lymphs Abs: 0.6 10*3/uL — ABNORMAL LOW (ref 0.7–4.0)
MCH: 30.3 pg (ref 26.0–34.0)
MCHC: 31.7 g/dL (ref 30.0–36.0)
MCV: 95.6 fL (ref 80.0–100.0)
Monocytes Absolute: 1.1 10*3/uL — ABNORMAL HIGH (ref 0.1–1.0)
Monocytes Relative: 8 %
Neutro Abs: 11.6 10*3/uL — ABNORMAL HIGH (ref 1.7–7.7)
Neutrophils Relative %: 86 %
PLATELETS: 228 10*3/uL (ref 150–400)
RBC: 2.97 MIL/uL — ABNORMAL LOW (ref 4.22–5.81)
RDW: 18.2 % — ABNORMAL HIGH (ref 11.5–15.5)
WBC: 13.5 10*3/uL — ABNORMAL HIGH (ref 4.0–10.5)
nRBC: 0 % (ref 0.0–0.2)

## 2018-06-21 LAB — CULTURE, BLOOD (ROUTINE X 2)
Culture: NO GROWTH
Culture: NO GROWTH
Special Requests: ADEQUATE

## 2018-06-21 NOTE — Progress Notes (Signed)
Recent discharge from hospital with pneumonia. Was started on continuous oxygen at home. No dyspnea. Seldom cough with clear phlegm. Denies pain. Appetite is fair. Slowly recovering. Bowels regular.

## 2018-06-21 NOTE — Progress Notes (Signed)
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 He underwent lung cancer screening low-dose CT scan which showed a right middle lobe lung mass of about 4.8 cm. Subcarinal adenopathy 2.8 x 4 cm and probable right hilar adenopathy and soft tissue fullness 2.3 cm. This was followed by a PET CT scan which showed right middle lobe mass was hypermetabolic at 54.2 with hypermetabolic right hilar and infrahilar adenopathy. Hypermetabolic 2.5 cm subcarinal node and right paratracheal nodes. No hypermetabolic contralateral adenopathy. Left upper lobe 4 mm solitary pulmonary nodule below PET resolution. No evidence of hypermetabolic them elsewhere.  Biopsy of the right middle lobe showed non-small cell carcinoma favor adenocarcinoma. 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 However it showed acute cerebellar stroke for which patient was admitted to the hospital and underwent a work-up including carotid Dopplers as well as CTA of the head and neck and echo with bubble study which were unremarkable  Patient received 4 weeks of concurrent carbotaxol chemotherapy and radiation which she tolerated relatively well.  He then had a break of 4 weeks as he was hospitalized twice in the interim for worsening shortness of breath  and was found to have multi lobar pneumonia treated with antibiotics   Interval history-patient was discharged from the hospital 3 days ago on home oxygen.  He still has some shortness of breath worse as compared to his baseline but overall better since the time he went to the hospital.  Denies any cough or fever.  Feels weaker and appetite is fair  ECOG PS- 2 Pain scale- 0 Opioid associated constipation- no  Review of systems- Review of Systems  Constitutional: Positive for malaise/fatigue. Negative for chills, fever and weight loss.  HENT: Negative for congestion, ear discharge and nosebleeds.   Eyes: Negative for blurred vision.  Respiratory: Positive for shortness of breath. Negative for cough, hemoptysis, sputum production and wheezing.   Cardiovascular: Negative for chest pain, palpitations, orthopnea and claudication.  Gastrointestinal: Negative for abdominal pain, blood in stool, constipation, diarrhea, heartburn, melena, nausea and vomiting.  Genitourinary: Negative for dysuria, flank pain, frequency, hematuria and urgency.  Musculoskeletal: Negative for back pain, joint pain and myalgias.  Skin: Negative for rash.  Neurological: Negative for dizziness, tingling, focal weakness, seizures, weakness and headaches.  Endo/Heme/Allergies: Does not bruise/bleed easily.  Psychiatric/Behavioral: Negative for depression and suicidal ideas. The patient does not have insomnia.       Allergies  Allergen Reactions  . Aspirin Itching    Patient states may not be allergic, he was taking too many aspirin and became itchy. As long as he takes one tablet daily, he is fine     Past Medical History:  Diagnosis Date  . CVA (cerebral vascular accident) ([place])   . Hypertension   . Lung cancer [facility])      Past Surgical History:  Procedure Laterality Date  .  CYST EXCISION Left    buttock  . ENDOBRONCHIAL ULTRASOUND Right 03/29/2018   Procedure: ENDOBRONCHIAL ULTRASOUND;  Surgeon:  Tyler Pita, MD;  Location: [facility] ORS;  Service: Cardiopulmonary;  Laterality: Right;  . FLEXIBLE BRONCHOSCOPY Right 03/29/2018   Procedure: FLEXIBLE BRONCHOSCOPY;  Surgeon: Tyler Pita, MD;  Location: [facility] ORS;  Service: Cardiopulmonary;  Laterality: Right;  . PORTA CATH INSERTION N/A 04/13/2018   Procedure: PORTA CATH INSERTION;  Surgeon: Algernon Huxley, MD;  Location: [place] CV LAB;  Service: Cardiovascular;  Laterality: N/A;    Social History   Socioeconomic History  . Marital status: Married    Spouse name: Not on file  . Number of children: 4  . Years of education: Not on file  . Highest education level: Not on file  Occupational History  . Occupation: retired    Comment: mill/construction   Social Needs  . Financial resource strain: Not hard at all  . Food insecurity:    Worry: Never true    Inability: Never true  . Transportation needs:    Medical: No    Non-medical: No  Tobacco Use  . Smoking status: Former Smoker    Packs/day: 1.00    Years: 58.00    Pack years: 58.00    Types: Cigarettes    Last attempt to quit: 03/30/2018    Years since quitting: 0.2  . Smokeless tobacco: Never Used  Substance and Sexual Activity  . Alcohol use: Not Currently    Comment: daily 32 oz beer  . Drug use: Not Currently  . Sexual activity: Not Currently  Lifestyle  . Physical activity:    Days per week: 0 days    Minutes per session: 0 min  . Stress: Only a little  Relationships  . Social connections:    Talks on phone: More than three times a week    Gets together: Twice a week    Attends religious service: More than 4 times per year    Active member of club or organization: Not on file    Attends meetings of clubs or organizations: Not on file    Relationship status: Married  . Intimate partner violence:    Fear of current or ex partner: No    Emotionally abused: No    Physically abused: No    Forced sexual activity: No  Other Topics Concern  . Not  on file  Social History Narrative   Lives with wife at home    Family History  Problem Relation Age of Onset  . Diabetes Mother   . CAD Mother   . Stroke Father      Current Outpatient Medications:  .  albuterol (PROVENTIL HFA;VENTOLIN HFA) 108 (90 Base) MCG/ACT inhaler, Inhale 2 puffs into the lungs every 6 (six) hours as needed for wheezing or shortness of breath., Disp: 1 Inhaler, Rfl: 2 .  aspirin EC 81 MG EC tablet, Take 1 tablet (81 mg total) by mouth daily., Disp: 30 tablet, Rfl: 1 .  atorvastatin (LIPITOR) 40 MG tablet, Take 1 tablet (40 mg total) by mouth daily at 6 PM., Disp: 30 tablet, Rfl: 1 .  budesonide-formoterol (SYMBICORT) 160-4.5 MCG/ACT inhaler, Inhale 2 puffs into the lungs 2 (two) times daily., Disp: , Rfl:  .  Cyanocobalamin (VITAMIN B-12 PO), Take 1 tablet by mouth daily., Disp: , Rfl:  .  diltiazem (CARDIZEM CD) 300 MG 24 hr capsule, Take 1 capsule (300 mg total) by mouth daily., Disp: 30 capsule, Rfl: 0 .  doxycycline (VIBRA-TABS) 100 MG tablet, Take 1 tablet (100 mg total) by mouth 2 (two) times daily with a meal for 5 days., Disp: 10 tablet, Rfl: 0 .  guaiFENesin-dextromethorphan (ROBITUSSIN DM) 100-10 MG/5ML syrup, Take 5 mLs by mouth every 4 (four) hours as needed for cough., Disp: 118 mL, Rfl: 0 .  levalbuterol (XOPENEX) 1.25 MG/0.5ML nebulizer solution, Take 1.25 mg by nebulization every 6 (six) hours as needed for wheezing or shortness of breath., Disp: 30 each, Rfl: 1 .  Multiple Vitamins-Minerals (MULTIVITAMIN PO), Take 1 tablet by mouth daily., Disp: , Rfl:  .  predniSONE (STERAPRED UNI-PAK 21 TAB) 10 MG (21) TBPK tablet, Take 6 tabs first day, 5 tab on day 2, then 4 on day 3rd, 3 tabs on day 4th , 2 tab on day 5th, and 1 tab on 6th day., Disp: 21 tablet, Rfl: 0 .  Pyridoxine HCl (VITAMIN B-6 PO), Take 1 tablet by mouth daily., Disp: , Rfl:  No current facility-administered medications for this visit.   Facility-Administered Medications Ordered in Other  Visits:  .  sodium chloride flush (NS) 0.9 % injection 10 mL, 10 mL, Intracatheter, PRN, Sindy Guadeloupe, MD .  sodium chloride flush (NS) 0.9 % injection 10 mL, 10 mL, Intravenous, PRN, Sindy Guadeloupe, MD, 10 mL at 06/14/18 0907  Physical exam:  Vitals:   06/21/18 0850  BP: 122/73  Pulse: 97  Resp: 16  Temp: 97.7 F (36.5 C)  TempSrc: Tympanic  Weight: 151 lb 14.4 oz (68.9 kg)   Physical Exam Constitutional:      Comments: Appears fatigued  HENT:     Head: Normocephalic and atraumatic.  Eyes:     Pupils: Pupils are equal, round, and reactive to light.  Neck:     Musculoskeletal: Normal range of motion.  Cardiovascular:     Rate and Rhythm: Regular rhythm. Tachycardia present.     Heart sounds: Normal heart sounds.  Pulmonary:     Breath sounds: Normal breath sounds.     Comments: Increased effort. Decreased breath sounds over left lung base Abdominal:     General: Bowel sounds are normal.     Palpations: Abdomen is soft.  Skin:    General: Skin is warm and dry.  Neurological:     Mental Status: He is alert and oriented to person, place, and time.      CMP Latest Ref Rng & Units 06/21/2018  Glucose 70 - 99 mg/dL 104(H)  BUN 8 - 23 mg/dL 10  Creatinine 0.61 - 1.24 mg/dL 0.72  Sodium 135 - 145 mmol/L 136  Potassium 3.5 - 5.1 mmol/L 3.7  Chloride 98 - 111 mmol/L 101  CO2 22 - 32 mmol/L 28  Calcium 8.9 - 10.3 mg/dL 9.1  Total Protein 6.5 - 8.1 g/dL 6.3(L)  Total Bilirubin 0.3 - 1.2 mg/dL 0.5  Alkaline Phos 38 - 126 U/L 85  AST 15 - 41 U/L 53(H)  ALT 0 - 44 U/L 82(H)   CBC Latest Ref Rng & Units 06/21/2018  WBC 4.0 - 10.5 K/uL 13.5(H)  Hemoglobin 13.0 - 17.0 g/dL 9.0(L)  Hematocrit 39.0 - 52.0 % 28.4(L)  Platelets 150 - 400 K/uL 228    No images are attached to the encounter.  Ct Angio Chest Pe W Or Wo Contrast  Result Date: 06/07/2018 CLINICAL DATA:  73 year old male with history of adenocarcinoma of the lung. Shortness of breath. Supraventricular  tachycardia. EXAM: CT ANGIOGRAPHY CHEST WITH CONTRAST TECHNIQUE: Multidetector CT imaging of the  chest was performed using the standard protocol during bolus administration of intravenous contrast. Multiplanar CT image reconstructions and MIPs were obtained to evaluate the vascular anatomy. CONTRAST:  73m OMNIPAQUE IOHEXOL 350 MG/ML SOLN COMPARISON:  PET-CT 03/23/2018.  Chest CT 03/17/2018. FINDINGS: Cardiovascular: No filling defects within the pulmonary arterial tree to suggest underlying pulmonary embolism. Heart size is mildly enlarged. There is aortic atherosclerosis, as well as atherosclerosis of the great vessels of the mediastinum and the coronary arteries, including calcified atherosclerotic plaque in the left main and left anterior descending coronary arteries. Right internal jugular single-lumen porta cath with tip terminating in the distal superior vena cava. Mediastinum/Nodes: Multiple prominent borderline enlarged and enlarged mediastinal and hilar lymph nodes, largest of which is a subcarinal lymph node measuring up to 2.1 cm in short axis. Esophagus is unremarkable in appearance. Esophagus is unremarkable in appearance. No axillary lymphadenopathy. Lungs/Pleura: Widespread but patchy areas of ground-glass attenuation and septal thickening scattered throughout the lungs bilaterally, significantly increased compared to the prior study from October 2019. Scattered areas of cylindrical and mild varicose bronchiectasis. Some thick-walled cystic changes are noted in the right upper lobe. Volume loss and scarring in the right middle lobe. Trace bilateral pleural effusions. Upper Abdomen: Unremarkable. Musculoskeletal: There are no aggressive appearing lytic or blastic lesions noted in the visualized portions of the skeleton. Review of the MIP images confirms the above findings. IMPRESSION: 1. No evidence of pulmonary embolism. 2. The appearance the chest suggests severe multilobar pneumonia. There also  scattered areas of what appear to be post infectious or inflammatory scarring in the lungs bilaterally. 3. Aortic atherosclerosis, in addition to left main and left anterior descending coronary artery disease. Assessment for potential risk factor modification, dietary therapy or pharmacologic therapy may be warranted, if clinically indicated. Aortic Atherosclerosis (ICD10-I70.0). Electronically Signed   By: DVinnie LangtonM.D.   On: 06/07/2018 19:20   Dg Chest Portable 1 View  Result Date: 06/15/2018 CLINICAL DATA:  Shortness of breath. EXAM: PORTABLE CHEST 1 VIEW COMPARISON:  Radiographs and CT 06/07/2018 FINDINGS: Right chest port in the mid SVC. Unchanged heart size and mediastinal contours. Mild chronic volume loss in the right lung. Diffuse interstitial opacities corresponding to ground-glass opacities on CT. Slight improvement in the left lung from prior exam. Small pleural effusions on CT are not seen radiographically. No pneumothorax. Unchanged osseous structures. IMPRESSION: 1. Diffuse lung opacities with slight improvement in the left lung from prior exam. Unchanged appearance of the right hemithorax. CT findings suspicious for pneumonia. 2. No new abnormalities. Electronically Signed   By: MKeith RakeM.D.   On: 06/15/2018 21:32   Dg Chest Portable 1 View  Result Date: 06/07/2018 CLINICAL DATA:  Two day history of dizziness and weakness. History of lung malignancy and hypertension. Discontinued smoking in October 2019 EXAM: PORTABLE CHEST 1 VIEW COMPARISON:  CT scan of the chest of March 17, 2018 and PET-CT study of March 23, 2018. FINDINGS: The lungs are well-expanded. The interstitial markings are coarse especially in the right infrahilar region. There is no alveolar infiltrate. The heart is top-normal in size. The pulmonary vascularity is not engorged. External pacemaker defibrillator pads are present. A porta catheter is present with the tip projecting over the distal third of the  SVC. IMPRESSION: No focal pneumonia. Mild interstitial prominence bilaterally which is likely chronic. Top-normal cardiac size without pulmonary edema. Electronically Signed   By: David  JMartiniqueM.D.   On: 06/07/2018 09:45     Assessment and plan- Patient  is a 73 y.o. male withadenocarcinoma of the right middle lobe of the lung stage IIIBcT2 N3M0.  Patient is here for post hospital discharge follow-up to restart chemoradiation  Patient is weaker now as compared to what he was when he started his first round of chemotherapy.  He still has moderate anemia and fairly asymptomatic with his shortness of breath.  He will be completing his antibiotics this week.  I will hold off on starting chemotherapy this week and see him back next week with CBC and CMP.  He will also be assessed by radiation oncology to see if he can get radiation this week or next week.   Visit Diagnosis 1. HCAP (healthcare-associated pneumonia)   2. Malignant neoplasm of middle lobe of right lung ([place])   3. Antineoplastic chemotherapy induced anemia      Dr. Randa Evens, MD, MPH [facility] [phone] 06/21/2018 9:12 AM

## 2018-06-22 ENCOUNTER — Ambulatory Visit: Payer: Medicare PPO

## 2018-06-23 ENCOUNTER — Ambulatory Visit
Admission: RE | Admit: 2018-06-23 | Discharge: 2018-06-23 | Disposition: A | Discharge status: Home / Self Care | Payer: Medicare PPO | Source: Ambulatory Visit | Attending: Radiation Oncology | Admitting: Radiation Oncology

## 2018-06-23 DIAGNOSIS — C342 Malignant neoplasm of middle lobe, bronchus or lung: Secondary | ICD-10-CM | POA: Diagnosis not present

## 2018-06-23 LAB — TYPE AND SCREEN
ABO/RH(D): B POS
ANTIBODY SCREEN: POSITIVE
Unit division: 0
Unit division: 0
Unit division: 0

## 2018-06-23 LAB — BPAM RBC
BLOOD PRODUCT EXPIRATION DATE: 202001222359
Blood Product Expiration Date: 202001222359
Blood Product Expiration Date: 202001252359
ISSUE DATE / TIME: 202001022109
ISSUE DATE / TIME: 202001061103
ISSUE DATE / TIME: 202001070914
Unit Type and Rh: 7300
Unit Type and Rh: 7300
Unit Type and Rh: 7300

## 2018-06-24 ENCOUNTER — Ambulatory Visit
Admission: RE | Admit: 2018-06-24 | Discharge: 2018-06-24 | Disposition: A | Discharge status: Home / Self Care | Payer: Medicare PPO | Source: Ambulatory Visit | Attending: Radiation Oncology | Admitting: Radiation Oncology

## 2018-06-24 DIAGNOSIS — A419 Sepsis, unspecified organism: Secondary | ICD-10-CM | POA: Diagnosis not present

## 2018-06-24 DIAGNOSIS — R0602 Shortness of breath: Secondary | ICD-10-CM | POA: Diagnosis not present

## 2018-06-27 ENCOUNTER — Other Ambulatory Visit: Payer: Self-pay

## 2018-06-27 ENCOUNTER — Inpatient Hospital Stay
Admission: EM | Admit: 2018-06-27 | Discharge: 2018-07-01 | DRG: 871 | Disposition: A | Discharge status: Home Health | Payer: Medicare PPO | Attending: Internal Medicine | Admitting: Internal Medicine

## 2018-06-27 ENCOUNTER — Encounter: Payer: Self-pay | Admitting: Emergency Medicine

## 2018-06-27 ENCOUNTER — Emergency Department: Payer: Medicare PPO

## 2018-06-27 ENCOUNTER — Other Ambulatory Visit: Payer: Self-pay | Admitting: *Deleted

## 2018-06-27 ENCOUNTER — Ambulatory Visit
Admission: RE | Admit: 2018-06-27 | Discharge: 2018-06-27 | Disposition: A | Discharge status: Home / Self Care | Payer: Medicare PPO | Source: Ambulatory Visit | Attending: Radiation Oncology | Admitting: Radiation Oncology

## 2018-06-27 DIAGNOSIS — C3481 Malignant neoplasm of overlapping sites of right bronchus and lung: Secondary | ICD-10-CM | POA: Diagnosis not present

## 2018-06-27 DIAGNOSIS — Z87891 Personal history of nicotine dependence: Secondary | ICD-10-CM

## 2018-06-27 DIAGNOSIS — J849 Interstitial pulmonary disease, unspecified: Secondary | ICD-10-CM | POA: Diagnosis not present

## 2018-06-27 DIAGNOSIS — J8410 Pulmonary fibrosis, unspecified: Secondary | ICD-10-CM | POA: Diagnosis present

## 2018-06-27 DIAGNOSIS — Z8701 Personal history of pneumonia (recurrent): Secondary | ICD-10-CM

## 2018-06-27 DIAGNOSIS — Z9981 Dependence on supplemental oxygen: Secondary | ICD-10-CM

## 2018-06-27 DIAGNOSIS — Z515 Encounter for palliative care: Secondary | ICD-10-CM

## 2018-06-27 DIAGNOSIS — J189 Pneumonia, unspecified organism: Secondary | ICD-10-CM | POA: Diagnosis present

## 2018-06-27 DIAGNOSIS — C342 Malignant neoplasm of middle lobe, bronchus or lung: Secondary | ICD-10-CM

## 2018-06-27 DIAGNOSIS — A419 Sepsis, unspecified organism: Secondary | ICD-10-CM

## 2018-06-27 DIAGNOSIS — Z823 Family history of stroke: Secondary | ICD-10-CM | POA: Diagnosis not present

## 2018-06-27 DIAGNOSIS — Z8673 Personal history of transient ischemic attack (TIA), and cerebral infarction without residual deficits: Secondary | ICD-10-CM | POA: Diagnosis not present

## 2018-06-27 DIAGNOSIS — Z7951 Long term (current) use of inhaled steroids: Secondary | ICD-10-CM

## 2018-06-27 DIAGNOSIS — C3491 Malignant neoplasm of unspecified part of right bronchus or lung: Secondary | ICD-10-CM | POA: Diagnosis present

## 2018-06-27 DIAGNOSIS — R0602 Shortness of breath: Secondary | ICD-10-CM | POA: Diagnosis present

## 2018-06-27 DIAGNOSIS — D6481 Anemia due to antineoplastic chemotherapy: Secondary | ICD-10-CM

## 2018-06-27 DIAGNOSIS — Y842 Radiological procedure and radiotherapy as the cause of abnormal reaction of the patient, or of later complication, without mention of misadventure at the time of the procedure: Secondary | ICD-10-CM | POA: Diagnosis present

## 2018-06-27 DIAGNOSIS — Z833 Family history of diabetes mellitus: Secondary | ICD-10-CM | POA: Diagnosis not present

## 2018-06-27 DIAGNOSIS — Z9221 Personal history of antineoplastic chemotherapy: Secondary | ICD-10-CM | POA: Diagnosis not present

## 2018-06-27 DIAGNOSIS — Z923 Personal history of irradiation: Secondary | ICD-10-CM | POA: Diagnosis not present

## 2018-06-27 DIAGNOSIS — Z66 Do not resuscitate: Secondary | ICD-10-CM | POA: Diagnosis present

## 2018-06-27 DIAGNOSIS — Z79899 Other long term (current) drug therapy: Secondary | ICD-10-CM

## 2018-06-27 DIAGNOSIS — Z7982 Long term (current) use of aspirin: Secondary | ICD-10-CM | POA: Diagnosis not present

## 2018-06-27 DIAGNOSIS — I10 Essential (primary) hypertension: Secondary | ICD-10-CM | POA: Diagnosis present

## 2018-06-27 DIAGNOSIS — F17211 Nicotine dependence, cigarettes, in remission: Secondary | ICD-10-CM | POA: Diagnosis not present

## 2018-06-27 DIAGNOSIS — E871 Hypo-osmolality and hyponatremia: Secondary | ICD-10-CM | POA: Diagnosis present

## 2018-06-27 DIAGNOSIS — E876 Hypokalemia: Secondary | ICD-10-CM | POA: Diagnosis present

## 2018-06-27 DIAGNOSIS — J181 Lobar pneumonia, unspecified organism: Secondary | ICD-10-CM | POA: Diagnosis not present

## 2018-06-27 DIAGNOSIS — T451X5A Adverse effect of antineoplastic and immunosuppressive drugs, initial encounter: Secondary | ICD-10-CM

## 2018-06-27 DIAGNOSIS — Y95 Nosocomial condition: Secondary | ICD-10-CM | POA: Diagnosis present

## 2018-06-27 DIAGNOSIS — J9621 Acute and chronic respiratory failure with hypoxia: Secondary | ICD-10-CM | POA: Diagnosis present

## 2018-06-27 LAB — BASIC METABOLIC PANEL
Anion gap: 7 (ref 5–15)
BUN: 11 mg/dL (ref 8–23)
CO2: 30 mmol/L (ref 22–32)
Calcium: 8.5 mg/dL — ABNORMAL LOW (ref 8.9–10.3)
Chloride: 95 mmol/L — ABNORMAL LOW (ref 98–111)
Creatinine, Ser: 0.61 mg/dL (ref 0.61–1.24)
GFR calc Af Amer: >60 mL/min (ref >60)
GFR calc non Af Amer: >60 mL/min (ref >60)
Glucose, Bld: 105 mg/dL — ABNORMAL HIGH (ref 70–99)
Potassium: 2.9 mmol/L — ABNORMAL LOW (ref 3.5–5.1)
Sodium: 132 mmol/L — ABNORMAL LOW (ref 135–145)

## 2018-06-27 LAB — CBC
HCT: 32.3 % — ABNORMAL LOW (ref 39.0–52.0)
Hemoglobin: 10.4 g/dL — ABNORMAL LOW (ref 13.0–17.0)
MCH: 31 pg (ref 26.0–34.0)
MCHC: 32.2 g/dL (ref 30.0–36.0)
MCV: 96.1 fL (ref 80.0–100.0)
PLATELETS: 188 10*3/uL (ref 150–400)
RBC: 3.36 MIL/uL — ABNORMAL LOW (ref 4.22–5.81)
RDW: 18 % — ABNORMAL HIGH (ref 11.5–15.5)
WBC: 10.7 10*3/uL — ABNORMAL HIGH (ref 4.0–10.5)
nRBC: 0 % (ref 0.0–0.2)

## 2018-06-27 LAB — PROCALCITONIN

## 2018-06-27 LAB — TROPONIN I: Troponin I: <0.03 ng/mL (ref <0.03)

## 2018-06-27 LAB — APTT: aPTT: 28 seconds (ref 24–36)

## 2018-06-27 LAB — MAGNESIUM: Magnesium: 1.8 mg/dL (ref 1.7–2.4)

## 2018-06-27 LAB — PROTIME-INR
INR: 1.03
Prothrombin Time: 13.4 seconds (ref 11.4–15.2)

## 2018-06-27 LAB — LACTIC ACID, PLASMA: Lactic Acid, Venous: 1.5 mmol/L (ref 0.5–1.9)

## 2018-06-27 LAB — BRAIN NATRIURETIC PEPTIDE: B Natriuretic Peptide: 69 pg/mL (ref 0.0–100.0)

## 2018-06-27 MED ORDER — ASPIRIN EC 81 MG PO TBEC
81.0000 mg | DELAYED_RELEASE_TABLET | Freq: Every day | ORAL | Status: DC
  Administered 2018-06-28 – 2018-07-01 (×4): 81 mg via ORAL
  Filled 2018-06-27 (×4): qty 1

## 2018-06-27 MED ORDER — VANCOMYCIN HCL 10 G IV SOLR
1250.0000 mg | Freq: Once | INTRAVENOUS | Status: AC
  Administered 2018-06-27: 1250 mg via INTRAVENOUS
  Filled 2018-06-27: qty 1250

## 2018-06-27 MED ORDER — VANCOMYCIN HCL 10 G IV SOLR
1250.0000 mg | Freq: Two times a day (BID) | INTRAVENOUS | Status: DC
  Administered 2018-06-27: 23:00:00 1250 mg via INTRAVENOUS
  Filled 2018-06-27 (×3): qty 1250

## 2018-06-27 MED ORDER — SODIUM CHLORIDE 0.9 % IV SOLN
2.0000 g | Freq: Once | INTRAVENOUS | Status: AC
  Administered 2018-06-27: 2 g via INTRAVENOUS
  Filled 2018-06-27: qty 2

## 2018-06-27 MED ORDER — DILTIAZEM HCL ER COATED BEADS 180 MG PO CP24
180.0000 mg | ORAL_CAPSULE | Freq: Every day | ORAL | Status: DC
  Administered 2018-06-28 – 2018-06-29 (×2): 180 mg via ORAL
  Filled 2018-06-27 (×2): qty 1

## 2018-06-27 MED ORDER — IPRATROPIUM-ALBUTEROL 0.5-2.5 (3) MG/3ML IN SOLN
3.0000 mL | Freq: Four times a day (QID) | RESPIRATORY_TRACT | Status: DC
  Administered 2018-06-27 – 2018-06-29 (×9): 3 mL via RESPIRATORY_TRACT
  Filled 2018-06-27 (×9): qty 3

## 2018-06-27 MED ORDER — SODIUM CHLORIDE 0.9% FLUSH
3.0000 mL | Freq: Two times a day (BID) | INTRAVENOUS | Status: DC
  Administered 2018-06-28 – 2018-07-01 (×7): 3 mL via INTRAVENOUS

## 2018-06-27 MED ORDER — ONDANSETRON HCL 4 MG/2ML IJ SOLN: 4.0000 mg | Freq: Four times a day (QID) | INTRAMUSCULAR | Status: DC | PRN

## 2018-06-27 MED ORDER — ONDANSETRON HCL 4 MG PO TABS: 4.0000 mg | ORAL_TABLET | Freq: Four times a day (QID) | ORAL | Status: DC | PRN

## 2018-06-27 MED ORDER — ATORVASTATIN CALCIUM 20 MG PO TABS
40.0000 mg | ORAL_TABLET | Freq: Every day | ORAL | Status: DC
  Administered 2018-06-27 – 2018-06-30 (×4): 40 mg via ORAL
  Filled 2018-06-27 (×5): qty 2

## 2018-06-27 MED ORDER — GUAIFENESIN-DM 100-10 MG/5ML PO SYRP
5.0000 mL | ORAL_SOLUTION | ORAL | Status: DC | PRN
  Filled 2018-06-27: qty 5

## 2018-06-27 MED ORDER — SENNOSIDES-DOCUSATE SODIUM 8.6-50 MG PO TABS: 1.0000 | ORAL_TABLET | Freq: Every evening | ORAL | Status: DC | PRN

## 2018-06-27 MED ORDER — SODIUM CHLORIDE 0.9% FLUSH
3.0000 mL | Freq: Once | INTRAVENOUS | Status: AC
  Administered 2018-06-27: 3 mL via INTRAVENOUS

## 2018-06-27 MED ORDER — ACETAMINOPHEN 650 MG RE SUPP: 650.0000 mg | Freq: Four times a day (QID) | RECTAL | Status: DC | PRN

## 2018-06-27 MED ORDER — POTASSIUM CHLORIDE 20 MEQ/15ML (10%) PO SOLN
40.0000 meq | Freq: Once | ORAL | Status: AC
  Administered 2018-06-27: 40 meq via ORAL
  Filled 2018-06-27: qty 30

## 2018-06-27 MED ORDER — METHYLPREDNISOLONE SODIUM SUCC 125 MG IJ SOLR
125.0000 mg | INTRAMUSCULAR | Status: AC
  Administered 2018-06-27: 125 mg via INTRAVENOUS
  Filled 2018-06-27: qty 2

## 2018-06-27 MED ORDER — SODIUM CHLORIDE 0.9 % IV SOLN
2.0000 g | Freq: Two times a day (BID) | INTRAVENOUS | Status: DC
  Administered 2018-06-28 – 2018-07-01 (×7): 2 g via INTRAVENOUS
  Filled 2018-06-27 (×11): qty 2

## 2018-06-27 MED ORDER — POTASSIUM CHLORIDE CRYS ER 20 MEQ PO TBCR
40.0000 meq | EXTENDED_RELEASE_TABLET | Freq: Two times a day (BID) | ORAL | Status: AC
  Administered 2018-06-27 – 2018-06-28 (×2): 40 meq via ORAL
  Filled 2018-06-27 (×2): qty 2

## 2018-06-27 MED ORDER — ENOXAPARIN SODIUM 40 MG/0.4ML ~~LOC~~ SOLN
40.0000 mg | SUBCUTANEOUS | Status: DC
  Administered 2018-06-27 – 2018-06-30 (×4): 40 mg via SUBCUTANEOUS
  Filled 2018-06-27 (×4): qty 0.4

## 2018-06-27 MED ORDER — SODIUM CHLORIDE 0.9 % IV SOLN: 250.0000 mL | INTRAVENOUS | Status: DC | PRN

## 2018-06-27 MED ORDER — ALBUTEROL SULFATE (2.5 MG/3ML) 0.083% IN NEBU: 2.5000 mg | INHALATION_SOLUTION | RESPIRATORY_TRACT | Status: DC | PRN

## 2018-06-27 MED ORDER — VANCOMYCIN HCL IN DEXTROSE 1-5 GM/200ML-% IV SOLN: 1000.0000 mg | Freq: Once | INTRAVENOUS | Status: DC

## 2018-06-27 MED ORDER — SODIUM CHLORIDE 0.9 % IV SOLN
INTRAVENOUS | Status: DC
  Administered 2018-06-27 – 2018-06-30 (×4): via INTRAVENOUS

## 2018-06-27 MED ORDER — IPRATROPIUM-ALBUTEROL 0.5-2.5 (3) MG/3ML IN SOLN
3.0000 mL | Freq: Once | RESPIRATORY_TRACT | Status: AC
  Administered 2018-06-27: 3 mL via RESPIRATORY_TRACT
  Filled 2018-06-27: qty 3

## 2018-06-27 MED ORDER — SODIUM CHLORIDE 0.9 % IV BOLUS
500.0000 mL | Freq: Once | INTRAVENOUS | Status: AC
  Administered 2018-06-27: 500 mL via INTRAVENOUS

## 2018-06-27 MED ORDER — HYDROCODONE-ACETAMINOPHEN 5-325 MG PO TABS: 1.0000 | ORAL_TABLET | ORAL | Status: DC | PRN

## 2018-06-27 MED ORDER — SODIUM CHLORIDE 0.9% FLUSH: 3.0000 mL | INTRAVENOUS | Status: DC | PRN

## 2018-06-27 MED ORDER — BISACODYL 5 MG PO TBEC: 5.0000 mg | DELAYED_RELEASE_TABLET | Freq: Every day | ORAL | Status: DC | PRN

## 2018-06-27 MED ORDER — MOMETASONE FURO-FORMOTEROL FUM 200-5 MCG/ACT IN AERO
2.0000 | INHALATION_SPRAY | Freq: Two times a day (BID) | RESPIRATORY_TRACT | Status: DC
  Administered 2018-06-27 – 2018-07-01 (×8): 2 via RESPIRATORY_TRACT
  Filled 2018-06-27: qty 8.8

## 2018-06-27 MED ORDER — ACETAMINOPHEN 325 MG PO TABS: 650.0000 mg | ORAL_TABLET | Freq: Four times a day (QID) | ORAL | Status: DC | PRN

## 2018-06-27 NOTE — H&P (Signed)
[facility] [place] NAME: Philip Weiss    MR#:  [id number]  DATE OF BIRTH:  16-Feb-1946  DATE OF ADMISSION:  06/27/2018  PRIMARY CARE PHYSICIAN: [facility], [facility]   REQUESTING/REFERRING PHYSICIAN: Dr. Jacqualine Code.  CHIEF COMPLAINT:   Chief Complaint  Patient presents with  . Shortness of Breath   Cough and shortness of breath for 2 days. HISTORY OF PRESENT ILLNESS:  Philip Weiss  is a 73 y.o. male with a known history of chronic respiratory failure on home oxygen 2 L, lung cancer, CVA, hypertension and recent pneumonia.  He is s/p chemotherapy and radiation therapy for lung cancer.  The patient was just discharged from the hospital after treatment for pneumonia.  He has had worsening shortness of breath, wheezing and cough for the past 2 days.  He denies any fever or chills, no orthopnea or nocturnal dyspnea or leg edema.  He is found tachycardia, leukocytosis and chest x-ray show multifocal pneumonia.  He is treated with cefepime and vancomycin in the ED.  PAST MEDICAL HISTORY:   Past Medical History:  Diagnosis Date  . CVA (cerebral vascular accident) ([place])   . Hypertension   . Lung cancer ([place])     PAST SURGICAL HISTORY:   Past Surgical History:  Procedure Laterality Date  . CYST EXCISION Left    buttock  . ENDOBRONCHIAL ULTRASOUND Right 03/29/2018   Procedure: ENDOBRONCHIAL ULTRASOUND;  Surgeon: Tyler Pita, MD;  Location: [facility] ORS;  Service: Cardiopulmonary;  Laterality: Right;  . FLEXIBLE BRONCHOSCOPY Right 03/29/2018   Procedure: FLEXIBLE BRONCHOSCOPY;  Surgeon: Tyler Pita, MD;  Location: [facility] ORS;  Service: Cardiopulmonary;  Laterality: Right;  . PORTA CATH INSERTION N/A 04/13/2018   Procedure: PORTA CATH INSERTION;  Surgeon: Algernon Huxley, MD;  Location: [place] CV LAB;  Service: Cardiovascular;  Laterality: N/A;    SOCIAL HISTORY:   Social History   Tobacco Use  . Smoking status: Former  Smoker    Packs/day: 1.00    Years: 58.00    Pack years: 58.00    Types: Cigarettes    Last attempt to quit: 03/30/2018    Years since quitting: 0.2  . Smokeless tobacco: Never Used  Substance Use Topics  . Alcohol use: Not Currently    Comment: daily 32 oz beer    FAMILY HISTORY:   Family History  Problem Relation Age of Onset  . Diabetes Mother   . CAD Mother   . Stroke Father     DRUG ALLERGIES:   Allergies  Allergen Reactions  . Aspirin Itching    Patient states may not be allergic, he was taking too many aspirin and became itchy. As long as he takes one tablet daily, he is fine    REVIEW OF SYSTEMS:   Review of Systems  Constitutional: Positive for malaise/fatigue. Negative for chills and fever.  HENT: Negative for sore throat.   Eyes: Negative for blurred vision and double vision.  Respiratory: Positive for cough, sputum production, shortness of breath and wheezing. Negative for hemoptysis and stridor.   Cardiovascular: Negative for chest pain, palpitations, orthopnea and leg swelling.  Gastrointestinal: Negative for abdominal pain, blood in stool, diarrhea, melena, nausea and vomiting.  Genitourinary: Negative for dysuria, flank pain and hematuria.  Musculoskeletal: Negative for back pain and joint pain.  Skin: Negative for rash.  Neurological: Negative for dizziness, sensory change, focal weakness, seizures, loss of consciousness, weakness and headaches.  Endo/Heme/Allergies: Negative for  polydipsia.  Psychiatric/Behavioral: Negative for depression. The patient is not nervous/anxious.     MEDICATIONS AT HOME:   Prior to Admission medications   Medication Sig Start Date End Date Taking? Authorizing Provider  albuterol (PROVENTIL HFA;VENTOLIN HFA) 108 (90 Base) MCG/ACT inhaler Inhale 2 puffs into the lungs every 6 (six) hours as needed for wheezing or shortness of breath. 06/10/18   Demetrios Loll, MD  aspirin EC 81 MG EC tablet Take 1 tablet (81 mg total) by mouth  daily. 04/09/18   Fritzi Mandes, MD  atorvastatin (LIPITOR) 40 MG tablet Take 1 tablet (40 mg total) by mouth daily at 6 PM. 04/08/18   Fritzi Mandes, MD  budesonide-formoterol [facility]) 160-4.5 MCG/ACT inhaler Inhale 2 puffs into the lungs 2 (two) times daily.    [provider]  Cyanocobalamin (VITAMIN B-12 PO) Take 1 tablet by mouth daily.    [provider]  diltiazem (CARDIZEM CD) 300 MG 24 hr capsule Take 1 capsule (300 mg total) by mouth daily. 06/19/18   Vaughan Basta, MD  guaiFENesin-dextromethorphan (ROBITUSSIN DM) 100-10 MG/5ML syrup Take 5 mLs by mouth every 4 (four) hours as needed for cough. 06/10/18   Demetrios Loll, MD  levalbuterol Penne Lash) 1.25 MG/0.5ML nebulizer solution Take 1.25 mg by nebulization every 6 (six) hours as needed for wheezing or shortness of breath. 06/19/18   Vaughan Basta, MD  Multiple Vitamins-Minerals (MULTIVITAMIN PO) Take 1 tablet by mouth daily.    [provider]  predniSONE (STERAPRED UNI-PAK 21 TAB) 10 MG (21) TBPK tablet Take 6 tabs first day, 5 tab on day 2, then 4 on day 3rd, 3 tabs on day 4th , 2 tab on day 5th, and 1 tab on 6th day. 06/19/18   Vaughan Basta, MD  Pyridoxine HCl (VITAMIN B-6 PO) Take 1 tablet by mouth daily.    [provider]      VITAL SIGNS:  Blood pressure 125/79, pulse (!) 107, temperature 97.6 F (36.4 C), temperature source Oral, resp. rate 20, height _0  (1.753 m), weight 68.9 kg, SpO2 98 %.  PHYSICAL EXAMINATION:  Physical Exam  GENERAL:  73 y.o.-year-old patient lying in the bed with no acute distress.  EYES: Pupils equal, round, reactive to light and accommodation. No scleral icterus. Extraocular muscles intact.  HEENT: Head atraumatic, normocephalic. Oropharynx and nasopharynx clear.  NECK:  Supple, no jugular venous distention. No thyroid enlargement, no tenderness.  LUNGS: Normal breath sounds bilaterally, some wheezing and rhonchi. No use of accessory muscles of  respiration.  CARDIOVASCULAR: S1, S2 normal. No murmurs, rubs, or gallops.  ABDOMEN: Soft, nontender, nondistended. Bowel sounds present. No organomegaly or mass.  EXTREMITIES: No pedal edema, cyanosis, or clubbing.  NEUROLOGIC: Cranial nerves II through XII are intact. Muscle strength 5/5 in all extremities. Sensation intact. Gait not checked.  PSYCHIATRIC: The patient is alert and oriented x 3.  SKIN: No obvious rash, lesion, or ulcer.   LABORATORY PANEL:   CBC Recent Labs  Lab 06/27/18 1055  WBC 10.7*  HGB 10.4*  HCT 32.3*  PLT 188   ------------------------------------------------------------------------------------------------------------------  Chemistries  Recent Labs  Lab 06/21/18 0829 06/27/18 1055 06/27/18 1233  NA 136 132*  --   K 3.7 2.9*  --   CL 101 95*  --   CO2 28 30  --   GLUCOSE 104* 105*  --   BUN 10 11  --   CREATININE 0.72 0.61  --   CALCIUM 9.1 8.5*  --   MG  --   --  1.8  AST 53*  --   --   ALT 82*  --   --   ALKPHOS 85  --   --   BILITOT 0.5  --   --    ------------------------------------------------------------------------------------------------------------------  Cardiac Enzymes Recent Labs  Lab 06/27/18 1055  TROPONINI <0.03   ------------------------------------------------------------------------------------------------------------------  RADIOLOGY:  Dg Chest 2 View  Result Date: 06/27/2018 CLINICAL DATA:  Shortness of breath on exertion since yesterday afternoon. History of lung cancer. EXAM: CHEST - 2 VIEW COMPARISON:  06/15/2018 and CT 06/07/2018 FINDINGS: Right IJ Port-A-Cath unchanged. Lungs are adequately inflated demonstrate worsening patchy airspace process bilaterally most prominent over the right mid to lower lung likely ongoing infection. No significant effusion. Cardiomediastinal silhouette and remainder of the exam is unchanged. IMPRESSION: Interval worsening patchy bilateral airspace process most prominent over the  right mid to lower lung likely multifocal pneumonia. Electronically Signed   By: Marin Olp M.D.   On: 06/27/2018 11:49      IMPRESSION AND PLAN:   Sepsis due to multifocal pneumonia, HAP. The patient will be admitted to medical floor. Continue cefepime and vancomycin, follow-up CBC and cultures. Robitussin as needed.  Hyponatremia.  Normal saline IV and follow-up BMP.  Hypokalemia.  Potassium supplement and follow-up level.  Chronic respiratory failure.  Continue oxygen by nasal cannula, DuoNeb as needed.  Lung cancer.  S/p chemotherapy and radiation therapy.  Follow-up with Dr. Janese Banks as outpatient.   All the records are reviewed and case discussed with ED provider. Management plans discussed with the patient, his wife and they are in agreement.  CODE STATUS: DNR  TOTAL TIME TAKING CARE OF THIS PATIENT: 32 minutes.    Demetrios Loll M.D on 06/27/2018 at 2:22 PM  Between 7am to 6pm - Pager - [phone]  After 6pm go to [email or web address] - [employer] [facility]  Office  [phone]  CC: Primary care physician; [facility]   Note: This dictation was prepared with Dragon dictation along with smaller phrase technology. Any transcriptional errors that result from this process are unin

## 2018-06-27 NOTE — ED Provider Notes (Signed)
[facility] Emergency Department Provider Note ____________________________________________   First MD Initiated Contact with Patient 06/27/18 1127     (approximate)  I have reviewed the triage vital signs and the nursing notes.   HISTORY  Chief Complaint Shortness of Breath  HPI Philip Weiss is a 73 y.o. male here for evaluation of shortness of breath  Patient reports he went to radiation today, they noticed that his oxygen levels were low, and he has been feeling increased shortness of breath for a couple of days.  He reports it feels like he did when he had pneumonia a week or 2 ago.  Complete antibiotic course.  He does report a cough, no wheezing.  Increasing shortness of breath over the last day or so.  No pain.  No leg swelling.  No nausea vomiting.  Just reports feels short of breath.   Shortness of breath is much worse if he tries to walk  Past Medical History:  Diagnosis Date  . CVA (cerebral vascular accident) ([place])   . Hypertension   . Lung cancer [facility])     Patient Active Problem List   Diagnosis Date Noted  . Palliative care encounter   . Pneumonia 06/27/2018  . HTN (hypertension) 06/15/2018  . Sepsis ([place]) 06/15/2018  . Acute respiratory failure with hypoxia ([place])   . SVT (supraventricular tachycardia) ([place]) 06/07/2018  . HCAP (healthcare-associated pneumonia) 06/07/2018  . Goals of care, counseling/discussion 05/17/2018  . Chemotherapy induced neutropenia ([place]) 05/10/2018  . CVA (cerebral vascular accident) ([place]) 04/07/2018  . Lung cancer ([place]) 04/05/2018  . Mass of middle lobe of right lung 03/29/2018  . Mediastinal mass     Past Surgical History:  Procedure Laterality Date  . CYST EXCISION Left    buttock  . ENDOBRONCHIAL ULTRASOUND Right 03/29/2018   Procedure: ENDOBRONCHIAL ULTRASOUND;  Surgeon: Tyler Pita, MD;  Location: [facility] ORS;  Service: Cardiopulmonary;  Laterality: Right;  . FLEXIBLE BRONCHOSCOPY Right  03/29/2018   Procedure: FLEXIBLE BRONCHOSCOPY;  Surgeon: Tyler Pita, MD;  Location: [facility] ORS;  Service: Cardiopulmonary;  Laterality: Right;  . PORTA CATH INSERTION N/A 04/13/2018   Procedure: PORTA CATH INSERTION;  Surgeon: Algernon Huxley, MD;  Location: [place] CV LAB;  Service: Cardiovascular;  Laterality: N/A;    Prior to Admission medications   Medication Sig Start Date End Date Taking? Authorizing Provider  aspirin EC 81 MG EC tablet Take 1 tablet (81 mg total) by mouth daily. 04/09/18  Yes Fritzi Mandes, MD  atorvastatin (LIPITOR) 40 MG tablet Take 1 tablet (40 mg total) by mouth daily at 6 PM. 04/08/18  Yes Fritzi Mandes, MD  budesonide-formoterol [facility]) 160-4.5 MCG/ACT inhaler Inhale 2 puffs into the lungs 2 (two) times daily.   Yes [provider]  Cyanocobalamin (VITAMIN B-12 PO) Take 1 tablet by mouth daily.   Yes [provider]  diltiazem (CARDIZEM CD) 300 MG 24 hr capsule Take 1 capsule (300 mg total) by mouth daily. 06/19/18  Yes Vaughan Basta, MD  Multiple Vitamins-Minerals (MULTIVITAMIN PO) Take 1 tablet by mouth daily.   Yes [provider]  Pyridoxine HCl (VITAMIN B-6 PO) Take 1 tablet by mouth daily.   Yes [provider]  albuterol (PROVENTIL HFA;VENTOLIN HFA) 108 (90 Base) MCG/ACT inhaler Inhale 2 puffs into the lungs every 6 (six) hours as needed for wheezing or shortness of breath. 06/10/18   Demetrios Loll, MD  guaiFENesin-dextromethorphan [facility] DM) 100-10 MG/5ML syrup Take 5 mLs by mouth every  4 (four) hours as needed for cough. 06/10/18   Demetrios Loll, MD  levalbuterol Penne Lash) 1.25 MG/0.5ML nebulizer solution Take 1.25 mg by nebulization every 6 (six) hours as needed for wheezing or shortness of breath. 06/19/18   Vaughan Basta, MD  predniSONE (STERAPRED UNI-PAK 21 TAB) 10 MG (21) TBPK tablet Take 6 tabs first day, 5 tab on day 2, then 4 on day 3rd, 3 tabs on day 4th , 2 tab on day 5th, and 1 tab on 6th  day. Patient not taking: Reported on 06/27/2018 06/19/18   Vaughan Basta, MD    Allergies Aspirin  Family History  Problem Relation Age of Onset  . Diabetes Mother   . CAD Mother   . Stroke Father     Social History Social History   Tobacco Use  . Smoking status: Former Smoker    Packs/day: 1.00    Years: 58.00    Pack years: 58.00    Types: Cigarettes    Last attempt to quit: 03/30/2018    Years since quitting: 0.2  . Smokeless tobacco: Never Used  Substance Use Topics  . Alcohol use: Not Currently    Comment: daily 32 oz beer  . Drug use: Not Currently    Review of Systems Constitutional: No fever/chills Eyes: No visual changes. ENT: No sore throat. Cardiovascular: Denies chest pain. Respiratory: See HPI gastrointestinal: No abdominal pain.   Genitourinary: Negative for dysuria. Musculoskeletal: Negative for back pain. Skin: Negative for rash. Neurological: Negative for headaches, areas of focal weakness or numbness.    ____________________________________________   PHYSICAL EXAM:  VITAL SIGNS: ED Triage Vitals  Enc Vitals Group     BP 06/27/18 1045 103/71     Pulse Rate 06/27/18 1045 63     Resp 06/27/18 1045 16     Temp 06/27/18 1045 97.6 F (36.4 C)     Temp Source 06/27/18 1045 Oral     SpO2 06/27/18 1045 97 %     Weight 06/27/18 1043 151 lb 14.4 oz (68.9 kg)     Height 06/27/18 1043 _0  (1.753 m)     Head Circumference --      Peak Flow --      Pain Score 06/27/18 1043 0     Pain Loc --      Pain Edu? --      Excl. in [place]? --     Constitutional: Alert and oriented.  Chronically ill-appearing, slightly dyspneic in appearance with oxygen saturation at 91% on 2 L initially Eyes: Conjunctivae are normal. Head: Atraumatic. Nose: No congestion/rhinnorhea. Mouth/Throat: Mucous membranes are moist. Neck: No stridor.  Cardiovascular: Tachycardic rate, regular rhythm. Grossly normal heart sounds.  Good peripheral  circulation. Respiratory: Very minimal use of accessory muscles.  Speaks in very short sentences and short phrases.  No wheezing.  Crackles in the bases bilaterally.  Occasional dry cough.  Gastrointestinal: Soft and nontender. No distention. Musculoskeletal: No lower extremity tenderness nor edema. Neurologic:  Normal speech and language. No gross focal neurologic deficits are appreciated.  Skin:  Skin is warm, dry and intact. No rash noted. Psychiatric: Mood and affect are normal. Speech and behavior are normal.  ____________________________________________   LABS (all labs ordered are listed, but only abnormal results are displayed)  Labs Reviewed  BASIC METABOLIC PANEL - Abnormal; Notable for the following components:      Result Value   Sodium 132 (*)    Potassium 2.9 (*)    Chloride 95 (*)  Glucose, Bld 105 (*)    Calcium 8.5 (*)    All other components within normal limits  CBC - Abnormal; Notable for the following components:   WBC 10.7 (*)    RBC 3.36 (*)    Hemoglobin 10.4 (*)    HCT 32.3 (*)    RDW 18.0 (*)    All other components within normal limits  BASIC METABOLIC PANEL - Abnormal; Notable for the following components:   Sodium 134 (*)    Glucose, Bld 194 (*)    Calcium 8.1 (*)    All other components within normal limits  CBC - Abnormal; Notable for the following components:   RBC 2.91 (*)    Hemoglobin 8.9 (*)    HCT 27.7 (*)    RDW 17.8 (*)    All other components within normal limits  GLUCOSE, CAPILLARY - Abnormal; Notable for the following components:   Glucose-Capillary 220 (*)    All other components within normal limits  CULTURE, BLOOD (ROUTINE X 2)  CULTURE, BLOOD (ROUTINE X 2)  MRSA PCR SCREENING  TROPONIN I  BRAIN NATRIURETIC PEPTIDE  MAGNESIUM  LACTIC ACID, PLASMA  PROCALCITONIN  PROTIME-INR  APTT  PROCALCITONIN   ____________________________________________  EKG  Reviewed entered by me at 1102 Heart rate 130 QRS 85 QTc  470 Sinus tachycardia, probable LVH.  No evidence of acute ischemia ____________________________________________  RADIOLOGY  Chest x-ray reviewed concerning for increasing by lateral infiltrates  Dg Chest 2 View  Result Date: 06/27/2018 CLINICAL DATA:  Shortness of breath on exertion since yesterday afternoon. History of lung cancer. EXAM: CHEST - 2 VIEW COMPARISON:  06/15/2018 and CT 06/07/2018 FINDINGS: Right IJ Port-A-Cath unchanged. Lungs are adequately inflated demonstrate worsening patchy airspace process bilaterally most prominent over the right mid to lower lung likely ongoing infection. No significant effusion. Cardiomediastinal silhouette and remainder of the exam is unchanged. IMPRESSION: Interval worsening patchy bilateral airspace process most prominent over the right mid to lower lung likely multifocal pneumonia. Electronically Signed   By: Marin Olp M.D.   On: 06/27/2018 11:49    ____________________________________________   PROCEDURES  Procedure(s) performed: None  Procedures  Critical Care performed: Yes, see critical care note(s)  CRITICAL CARE Performed by: Delman Kitten   Total critical care time: 37 minutes  Critical care time was exclusive of separately billable procedures and treating other patients.  Critical care was necessary to treat or prevent imminent or life-threatening deterioration.  Critical care was time spent personally by me on the following activities: development of treatment plan with patient and/or surrogate as well as nursing, discussions with consultants, evaluation of patient's response to treatment, examination of patient, obtaining history from patient or surrogate, ordering and performing treatments and interventions, ordering and review of laboratory studies, ordering and review of radiographic studies, pulse oximetry and re-evaluation of patient's condition.  ____________________________________________   INITIAL IMPRESSION /  ASSESSMENT AND PLAN / ED COURSE  Pertinent labs & imaging results that were available during my care of the patient were reviewed by me and considered in my medical decision making (see chart for details).   Patient presents for low oxygen, increasing dyspnea reports feels similar to when he had recent pneumonia.  Completed treatment, had radiation therapy today.  Chest x-ray reveals increasing by lateral infiltrates, concerning for probable pneumonia.  Discussed with oncology, also on the differential they would advise radiation pneumonitis but feel more likely pneumonia based on his recent evaluations.  No chest pain.  No pleurisy.  Chest x-ray with bilateral increasing infiltrates does not appear to be consistent with pulmonary embolism, and he lacks chest pain or secondary signs of DVT.  Clinical Course as of Jun 30 2039  Mon Jun 27, 2018  1224 Discussed with Dr. Janese Banks, advises treat for HCAP now with current ordered Abx, but will see in consult as inpatient as radiation pnumonitis also on differential and agrees with IV steroids as well.    [MQ]    Clinical Course User Index [MQ] Delman Kitten, MD   ----------------------------------------- 12:35 PM on 06/27/2018 -----------------------------------------  Reassessment completed.  Slightly improved, heart rate down about 105.  Respirations 97% on room air.  Receiving nebulizer therapy, discussed with oncology and will admit the patient to medicine service for concerns of healthcare associated pneumonia.  ____________________________________________   FINAL CLINICAL IMPRESSION(S) / ED DIAGNOSES  Final diagnoses:  HCAP (healthcare-associated pneumonia)  Sepsis, due to unspecified organism, unspecified whether acute organ dysfunction present [facility])        Note:  This document was prepared using Dragon voice recognition software and may include unintentional dictation errors       Delman Kitten, MD 06/29/18 2043

## 2018-06-27 NOTE — Plan of Care (Signed)
  Problem: Education: Goal: Knowledge of General Education information will improve Description Including pain rating scale, medication(s)/side effects and non-pharmacologic comfort measures Outcome: Progressing   Problem: Health Behavior/Discharge Planning: Goal: Ability to manage health-related needs will improve Outcome: Progressing   Problem: Clinical Measurements: Goal: Ability to maintain clinical measurements within normal limits will improve Outcome: Progressing Goal: Will remain free from infection Outcome: Progressing Goal: Diagnostic test results will improve Outcome: Progressing Goal: Respiratory complications will improve Outcome: Progressing Note:  02 2l [place] chronic diminished Goal: Cardiovascular complication will be avoided Outcome: Progressing   Problem: Activity: Goal: Risk for activity intolerance will decrease Outcome: Progressing   Problem: Nutrition: Goal: Adequate nutrition will be maintained Outcome: Progressing   Problem: Coping: Goal: Level of anxiety will decrease Outcome: Progressing   Problem: Elimination: Goal: Will not experience complications related to urinary retention Outcome: Progressing   Problem: Pain Managment: Goal: General experience of comfort will improve Outcome: Progressing   Problem: Safety: Goal: Ability to remain free from injury will improve Outcome: Progressing   Problem: Skin Integrity: Goal: Risk for impaired skin integrity will decrease Outcome: Progressing

## 2018-06-27 NOTE — ED Notes (Signed)
Patient transported to X-ray 

## 2018-06-27 NOTE — ED Triage Notes (Signed)
Sent to ED from [facility] for evaluation of SOB and low Oxygen saturation x 1 day.  C/O DOE yesterday.  Wears 2l/[place] home oxygen, and sats this morning were 88-90%  Currently receiving radiation for lung CA

## 2018-06-27 NOTE — Progress Notes (Signed)
Advanced Care Plan.  Purpose of Encounter: CODE STATUS. Parties in Attendance: The patient, his wife and the me. Patient's Decisional Capacity: Yes. Medical Story: Philip Weiss  is a 73 y.o. male with a known history of chronic respiratory failure on home oxygen 2 L, lung cancer, CVA, hypertension and recent pneumonia.  The patient is being admitted for sepsis due to multifocal pneumonia, HAP.  I discussed with the patient about his current condition, prognosis and CODE STATUS.  The patient does not want to be resuscitated and intubated if he has cardiopulmonary arrest. Goals of Care Determinations: The patient needs palliative care consult. Plan:  Code Status: DNR. Palliative care consult. Time spent discussing advance care planning: 17-18 minutes.

## 2018-06-27 NOTE — Progress Notes (Signed)
CODE SEPSIS - PHARMACY COMMUNICATION  **Broad Spectrum Antibiotics should be administered within 1 hour of Sepsis diagnosis**  Time Code Sepsis Called/Page Received: 1228  Antibiotics Ordered: Cefepime/Vancomycin   Time of 1st antibiotic administration: Cefepime 1303  Additional action taken by pharmacy: None indicated   If necessary, Name of Provider/Nurse Contacted: N/A    Simpson,Michael L ,PharmD Clinical Pharmacist  06/27/2018  12:34 PM

## 2018-06-27 NOTE — Consult Note (Signed)
Pharmacy Antibiotic Note  Philip Weiss is a 73 y.o. male admitted on 06/27/2018 with pneumonia.  Pharmacy has been consulted for vancomycin and cefepime dosing. The patient was just discharged from this hospital after treatment for pneumonia. He received a single dose of vancomycin but the majority of his treatment during that visit was with cefepime. He was discharged with doxycycline on 06/19/18.  Plan:  1) Vancomycin 1250 mg IV loading dose, then 1250 mg Q 12 hrs. Goal AUC 400-550. Expected AUC: 544.3 SCr used: 0.61  2) cefepime 2 grams IV every 12 hours   Height: 5\' 9"  (175.3 cm) Weight: 151 lb 14.4 oz (68.9 kg) IBW/kg (Calculated) : 70.7  Temp (24hrs), Avg:97.6 F (36.4 C), Min:97.6 F (36.4 C), Max:97.6 F (36.4 C)  Recent Labs  Lab 06/21/18 0829 06/27/18 1055 06/27/18 1258  WBC 13.5* 10.7*  --   CREATININE 0.72 0.61  --   LATICACIDVEN  --   --  1.5    Estimated Creatinine Clearance: 78.9 mL/min (by C-G formula based on SCr of 0.61 mg/dL).    Antimicrobials this admission: vancomycin 1/20 >>  cefepime 1/20 >>   Microbiology results: 1/20 BCx: pending  Thank you for allowing pharmacy to be a part of this patient's care.  Dallie Piles, PharmD 06/27/2018 2:30 PM

## 2018-06-27 NOTE — ED Notes (Signed)
ED TO INPATIENT HANDOFF REPORT  Name/Age/Gender Philip Weiss 73 y.o. male  Code Status Code Status History    Date Active Date Inactive Code Status Order ID Comments User Context   06/16/2018 0219 06/19/2018 1843 DNR [id number]  Lance Coon, MD Inpatient   06/07/2018 1236 06/10/2018 1822 DNR [id number]  Loletha Grayer, MD ED   04/07/2018 1647 04/08/2018 2059 DNR [id number]  Loletha Grayer, MD ED    Questions for Most Recent Historical Code Status (Order [id number])    Question Answer Comment   In the event of cardiac or respiratory ARREST Do not call a "code blue"    In the event of cardiac or respiratory ARREST Do not perform Intubation, CPR, defibrillation or ACLS    In the event of cardiac or respiratory ARREST Use medication by any route, position, wound care, and other measures to relive pain and suffering. May use oxygen, suction and manual treatment of airway obstruction as needed for comfort.    Comments nurse may pronounce         Advance Directive Documentation     Most Recent Value  Type of Advance Directive  Healthcare Power of Attorney, Living will  Pre-existing out of facility DNR order (yellow form or pink MOST form)  -  "MOST" Form in Place?  -      Home/SNF/Other home  Chief Complaint sent by dr/low oxygen  Level of Care/Admitting Diagnosis ED Disposition    ED Disposition Condition [place]: [place] [[id number]]  Level of Care: Med-Surg [16]  Diagnosis: Pneumonia [227785]  Admitting Physician: Demetrios Loll [[id number]]  Attending Physician: Demetrios Loll [[id number]]  Estimated length of stay: past midnight tomorrow  Certification:: I certify this patient will need inpatient services for at least 2 midnights  PT Class (Do Not Modify): Inpatient [101]  PT Acc Code (Do Not Modify): Private [1]       Medical History Past Medical History:  Diagnosis Date  . CVA (cerebral vascular accident) ([place])   . Hypertension   .  Lung cancer ([employer])     Allergies Allergies  Allergen Reactions  . Aspirin Itching    Patient states may not be allergic, he was taking too many aspirin and became itchy. As long as he takes one tablet daily, he is fine    IV Location/Drains/Wounds Patient Lines/Drains/Airways Status   Active Line/Drains/Airways    Name:   Placement date:   Placement time:   Site:   Days:   Peripheral IV 06/27/18 Right Forearm   06/27/18    1230    Forearm   less than 1   Peripheral IV 06/27/18 Left Arm   06/27/18    1306    Arm   less than 1          Labs/Imaging Results for orders placed or performed during the hospital encounter of 06/27/18 (from the past 48 hour(s))  Brain natriuretic peptide     Status: None   Collection Time: 06/27/18 10:53 AM  Result Value Ref Range   B Natriuretic Peptide 69.0 0.0 - 100.0 pg/mL    Comment: Performed at [facility], [address], [place], [place] [address]  Basic metabolic panel     Status: Abnormal   Collection Time: 06/27/18 10:55 AM  Result Value Ref Range   Sodium 132 (L) 135 - 145 mmol/L   Potassium 2.9 (L) 3.5 - 5.1 mmol/L   Chloride 95 (L) 98 - 111 mmol/L  CO2 30 22 - 32 mmol/L   Glucose, Bld 105 (H) 70 - 99 mg/dL   BUN 11 8 - 23 mg/dL   Creatinine, Ser 0.61 0.61 - 1.24 mg/dL   Calcium 8.5 (L) 8.9 - 10.3 mg/dL   GFR calc non Af Amer >60 >60 mL/min   GFR calc Af Amer >60 >60 mL/min   Anion gap 7 5 - 15    Comment: Performed at [facility], [address] [place], [place] [address]  CBC     Status: Abnormal   Collection Time: 06/27/18 10:55 AM  Result Value Ref Range   WBC 10.7 (H) 4.0 - 10.5 K/uL   RBC 3.36 (L) 4.22 - 5.81 MIL/uL   Hemoglobin 10.4 (L) 13.0 - 17.0 g/dL   HCT 32.3 (L) 39.0 - 52.0 %   MCV 96.1 80.0 - 100.0 fL   MCH 31.0 26.0 - 34.0 pg   MCHC 32.2 30.0 - 36.0 g/dL   RDW 18.0 (H) 11.5 - 15.5 %   Platelets 188 150 - 400 K/uL   nRBC 0.0 0.0 - 0.2 %    Comment: Performed at [facility],  [address] [place], [place] [address]  Troponin I - ONCE - STAT     Status: None   Collection Time: 06/27/18 10:55 AM  Result Value Ref Range   Troponin I <0.03 <0.03 ng/mL    Comment: Performed at [facility], [address] [place], [place] [address]  Magnesium     Status: None   Collection Time: 06/27/18 12:33 PM  Result Value Ref Range   Magnesium 1.8 1.7 - 2.4 mg/dL    Comment: Performed at [facility], [address] [place], [place] [address]  Lactic acid, plasma     Status: None   Collection Time: 06/27/18 12:58 PM  Result Value Ref Range   Lactic Acid, Venous 1.5 0.5 - 1.9 mmol/L    Comment: Performed at [facility], [address], [place], [place] [address]   Dg Chest 2 View  Result Date: 06/27/2018 CLINICAL DATA:  Shortness of breath on exertion since yesterday afternoon. History of lung cancer. EXAM: CHEST - 2 VIEW COMPARISON:  06/15/2018 and CT 06/07/2018 FINDINGS: Right IJ Port-A-Cath unchanged. Lungs are adequately inflated demonstrate worsening patchy airspace process bilaterally most prominent over the right mid to lower lung likely ongoing infection. No significant effusion. Cardiomediastinal silhouette and remainder of the exam is unchanged. IMPRESSION: Interval worsening patchy bilateral airspace process most prominent over the right mid to lower lung likely multifocal pneumonia. Electronically Signed   By: Marin Olp M.D.   On: 06/27/2018 11:49    Pending Labs Unresulted Labs (From admission, onward)    Start     Ordered   06/27/18 1215  Blood Culture (routine x 2)  BLOOD CULTURE X 2,   STAT     06/27/18 1215   Signed and Held  Basic metabolic panel  Tomorrow morning,   R     Signed and Held   Signed and Held  CBC  Tomorrow morning,   R     Signed and Held   Signed and Held  Creatinine, serum  (enoxaparin (LOVENOX)    CrCl >/= 30 ml/min)  Weekly,   R    Comments:  while on enoxaparin therapy    Signed and Held    Signed and Held  Procalcitonin  ONCE - STAT,   R     Signed and Held  Signed and Held  Protime-INR  ONCE - STAT,   R     Signed and Held   Signed and Held  APTT  ONCE - STAT,   R     Signed and Held          Vitals/Pain Today's Vitals   06/27/18 1330 06/27/18 1400 06/27/18 1430 06/27/18 1434  BP: 132/84 125/79 120/77   Pulse: (!) 113 (!) 107 (!) 102 (!) 103  Resp:    (!) 21  Temp:      TempSrc:      SpO2: 97% 98% 97% 97%  Weight:      Height:      PainSc:        Isolation Precautions No active isolations  Medications Medications  vancomycin (VANCOCIN) 1,250 mg in sodium chloride 0.9 % 250 mL IVPB (1,250 mg Intravenous New Bag/Given 06/27/18 1308)  sodium chloride flush (NS) 0.9 % injection 3 mL (3 mLs Intravenous Given 06/27/18 1305)  ceFEPIme (MAXIPIME) 2 g in sodium chloride 0.9 % 100 mL IVPB (0 g Intravenous Stopped 06/27/18 1333)  ipratropium-albuterol (DUONEB) 0.5-2.5 (3) MG/3ML nebulizer solution 3 mL (3 mLs Nebulization Given 06/27/18 1255)  methylPREDNISolone sodium succinate (SOLU-MEDROL) 125 mg/2 mL injection 125 mg (125 mg Intravenous Given 06/27/18 1255)  potassium chloride 20 MEQ/15ML (10%) solution 40 mEq (40 mEq Oral Given 06/27/18 1321)  sodium chloride 0.9 % bolus 500 mL (0 mLs Intravenous Stopped 06/27/18 1405)    Mobility Ambulatory

## 2018-06-27 NOTE — Consult Note (Signed)
CODE SEPSIS - PHARMACY COMMUNICATION  **Broad Spectrum Antibiotics should be administered within 1 hour of Sepsis diagnosis**  Time Code Sepsis Called/Page Received: 1215  Antibiotics Ordered: cefepime  Time of 1st antibiotic administration: 1303  Additional action taken by pharmacy: none required  If necessary, Name of Provider/Nurse Contacted: N/A    Philip Weiss ,PharmD Clinical Pharmacist  06/27/2018  1:08 PM

## 2018-06-28 ENCOUNTER — Inpatient Hospital Stay: Payer: Medicare PPO

## 2018-06-28 ENCOUNTER — Other Ambulatory Visit: Payer: Self-pay | Admitting: Oncology

## 2018-06-28 ENCOUNTER — Ambulatory Visit: Payer: Medicare PPO

## 2018-06-28 ENCOUNTER — Inpatient Hospital Stay: Payer: Medicare PPO | Admitting: Oncology

## 2018-06-28 DIAGNOSIS — J849 Interstitial pulmonary disease, unspecified: Secondary | ICD-10-CM

## 2018-06-28 DIAGNOSIS — C3491 Malignant neoplasm of unspecified part of right bronchus or lung: Secondary | ICD-10-CM

## 2018-06-28 DIAGNOSIS — Z515 Encounter for palliative care: Secondary | ICD-10-CM

## 2018-06-28 DIAGNOSIS — J181 Lobar pneumonia, unspecified organism: Secondary | ICD-10-CM

## 2018-06-28 DIAGNOSIS — A419 Sepsis, unspecified organism: Principal | ICD-10-CM

## 2018-06-28 DIAGNOSIS — F17211 Nicotine dependence, cigarettes, in remission: Secondary | ICD-10-CM

## 2018-06-28 DIAGNOSIS — C3481 Malignant neoplasm of overlapping sites of right bronchus and lung: Secondary | ICD-10-CM

## 2018-06-28 LAB — BASIC METABOLIC PANEL
ANION GAP: 6 (ref 5–15)
BUN: 16 mg/dL (ref 8–23)
CO2: 24 mmol/L (ref 22–32)
Calcium: 8.1 mg/dL — ABNORMAL LOW (ref 8.9–10.3)
Chloride: 104 mmol/L (ref 98–111)
Creatinine, Ser: 0.66 mg/dL (ref 0.61–1.24)
GFR calc Af Amer: >60 mL/min (ref >60)
GFR calc non Af Amer: >60 mL/min (ref >60)
Glucose, Bld: 194 mg/dL — ABNORMAL HIGH (ref 70–99)
POTASSIUM: 3.9 mmol/L (ref 3.5–5.1)
Sodium: 134 mmol/L — ABNORMAL LOW (ref 135–145)

## 2018-06-28 LAB — CBC
HCT: 27.7 % — ABNORMAL LOW (ref 39.0–52.0)
HEMOGLOBIN: 8.9 g/dL — AB (ref 13.0–17.0)
MCH: 30.6 pg (ref 26.0–34.0)
MCHC: 32.1 g/dL (ref 30.0–36.0)
MCV: 95.2 fL (ref 80.0–100.0)
Platelets: 154 10*3/uL (ref 150–400)
RBC: 2.91 MIL/uL — ABNORMAL LOW (ref 4.22–5.81)
RDW: 17.8 % — ABNORMAL HIGH (ref 11.5–15.5)
WBC: 7.7 10*3/uL (ref 4.0–10.5)
nRBC: 0 % (ref 0.0–0.2)

## 2018-06-28 LAB — PROCALCITONIN: Procalcitonin: <0.1 ng/mL

## 2018-06-28 LAB — MRSA PCR SCREENING: MRSA BY PCR: NEGATIVE

## 2018-06-28 MED ORDER — METHYLPREDNISOLONE SODIUM SUCC 125 MG IJ SOLR
80.0000 mg | Freq: Four times a day (QID) | INTRAMUSCULAR | Status: DC
  Administered 2018-06-28 – 2018-07-01 (×11): 80 mg via INTRAVENOUS
  Filled 2018-06-28 (×11): qty 2

## 2018-06-28 MED ORDER — VANCOMYCIN HCL 10 G IV SOLR
1750.0000 mg | INTRAVENOUS | Status: DC
  Administered 2018-06-28 – 2018-06-29 (×2): 1750 mg via INTRAVENOUS
  Filled 2018-06-28 (×3): qty 1750

## 2018-06-28 NOTE — Consult Note (Signed)
Hematology/Oncology Consult note [facility] [phone] [phone]  Patient Care Team: Center, [facility] as PCP - General ([place]) Telford Nab, RN as Registered Nurse   Name of the patient: Philip Weiss  [id number]  Dec 16, 1945    Reason for consult : h/o stage III lung cancer admitted for HCAP third time   Requesting physician: Dr. 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  Since then he was on a chemo break and was last seen by me on 06/21/2018 at which time he was restarted on radiation.  However he was still short of breath and anemic and his chemotherapy was not restarted and was supposed to be restarted today.  Of note he has had recurrent admissions for healthcare associated pneumonia and this is his third admission for the same.  Each time he is being treated with IV antibiotics and discharged on oral antibiotics.  He feels well for about a week or so and then gets readmitted with hypoxia and shortness of breath. Last CT scan on 06/07/2018 showed appearance consistent with multilobar pneumonia.  Chest x-ray done this admission again shows interval worsening of the patchy bilateral airspace process over the right mid to lower lung concerning for multifocal pneumonia  He does not feel sob at rest but does fee sob with exertion.   ECOG PS- 2  Pain scale- 0   Review of systems- Review of Systems  Constitutional: Negative for chills, fever, malaise/fatigue and weight loss.  HENT: Negative for congestion, ear discharge and nosebleeds.   Eyes: Negative for blurred vision.  Respiratory: Positive for cough and shortness of breath. Negative for hemoptysis, sputum production and wheezing.   Cardiovascular: Negative for chest pain,  palpitations, orthopnea and claudication.  Gastrointestinal: Negative for abdominal pain, blood in stool, constipation, diarrhea, heartburn, melena, nausea and vomiting.  Genitourinary: Negative for dysuria, flank pain, frequency, hematuria and urgency.  Musculoskeletal: Negative for back pain, joint pain and myalgias.  Skin: Negative for rash.  Neurological: Negative for dizziness, tingling, focal weakness, seizures, weakness and headaches.  Endo/Heme/Allergies: Does not bruise/bleed easily.  Psychiatric/Behavioral: Negative for depression and suicidal ideas. The patient does not have insomnia.     Allergies  Allergen Reactions  . Aspirin Itching    Patient states may not be allergic, he was taking too many aspirin and became itchy. As long as he takes one tablet daily, he is fine    Patient Active Problem List   Diagnosis Date Noted  . Pneumonia 06/27/2018  . HTN (hypertension) 06/15/2018  . Sepsis ([place]) 06/15/2018  . Acute respiratory failure with hypoxia ([place])   . SVT (supraventricular tachycardia) ([place]) 06/07/2018  . HCAP (healthcare-associated pneumonia) 06/07/2018  . Goals of care, counseling/discussion 05/17/2018  . Chemotherapy induced neutropenia ([place]) 05/10/2018  . CVA (cerebral vascular accident) ([place]) 04/07/2018  . Lung cancer ([place]) 04/05/2018  . Mass of middle lobe of right lung 03/29/2018  . Mediastinal mass      Past Medical History:  Diagnosis Date  . CVA (cerebral vascular accident) ([place])   . Hypertension   . Lung cancer [facility])      Past Surgical History:  Procedure Laterality Date  . CYST EXCISION Left    buttock  . ENDOBRONCHIAL ULTRASOUND Right 03/29/2018   Procedure: ENDOBRONCHIAL ULTRASOUND;  Surgeon: Tyler Pita, MD;  Location: [facility] ORS;  Service: Cardiopulmonary;  Laterality: Right;  . FLEXIBLE BRONCHOSCOPY Right 03/29/2018   Procedure: FLEXIBLE BRONCHOSCOPY;  Surgeon: Tyler Pita, MD;  Location: [facility] ORS;  Service:  Cardiopulmonary;  Laterality: Right;  . PORTA CATH INSERTION N/A 04/13/2018   Procedure: PORTA CATH INSERTION;  Surgeon: Algernon Huxley, MD;  Location: [facility];  Service: Cardiovascular;  Laterality: N/A;    Social History   Socioeconomic History  . Marital status: Married    Spouse name: Not on file  . Number of children: 4  . Years of education: Not on file  . Highest education level: Not on file  Occupational History  . Occupation: retired    Comment: mill/construction   Social Needs  . Financial resource strain: Not hard at all  . Food insecurity:    Worry: Never true    Inability: Never true  . Transportation needs:    Medical: No    Non-medical: No  Tobacco Use  . Smoking status: Former Smoker    Packs/day: 1.00    Years: 58.00    Pack years: 58.00    Types: Cigarettes    Last attempt to quit: 03/30/2018    Years since quitting: 0.2  . Smokeless tobacco: Never Used  Substance and Sexual Activity  . Alcohol use: Not Currently    Comment: daily 32 oz beer  . Drug use: Not Currently  . Sexual activity: Not Currently  Lifestyle  . Physical activity:    Days per week: 0 days    Minutes per session: 0 min  . Stress: Only a little  Relationships  . Social connections:    Talks on phone: More than three times a week    Gets together: Twice a week    Attends religious service: More than 4 times per year    Active member of club or organization: Not on file    Attends meetings of clubs or organizations: Not on file    Relationship status: Married  . Intimate partner violence:    Fear of current or ex partner: No    Emotionally abused: No    Physically abused: No    Forced sexual activity: No  Other Topics Concern  . Not on file  Social History Narrative   Lives with wife at home     Family History  Problem Relation Age of Onset  . Diabetes Mother   . CAD Mother   . Stroke Father      Current Facility-Administered Medications:  .  0.9 %   sodium chloride infusion, 250 mL, Intravenous, PRN, Demetrios Loll, MD .  0.9 %  sodium chloride infusion, , Intravenous, Continuous, Demetrios Loll, MD, Last Rate: 75 mL/hr at 06/28/18 0541 .  acetaminophen (TYLENOL) tablet 650 mg, 650 mg, Oral, Q6H PRN **OR** acetaminophen (TYLENOL) suppository 650 mg, 650 mg, Rectal, Q6H PRN, Demetrios Loll, MD .  albuterol (PROVENTIL) (2.5 MG/3ML) 0.083% nebulizer solution 2.5 mg, 2.5 mg, Nebulization, Q2H PRN, Demetrios Loll, MD .  aspirin EC tablet 81 mg, 81 mg, Oral, Daily, Demetrios Loll, MD, 81 mg at 06/28/18 7121 .  atorvastatin (LIPITOR) tablet 40 mg, 40 mg, Oral, q1800, Demetrios Loll, MD, 40 mg at 06/27/18 1726 .  bisacodyl (DULCOLAX) EC tablet 5 mg, 5 mg, Oral, Daily PRN, Demetrios Loll, MD .  ceFEPIme (MAXIPIME) 2 g in sodium chloride 0.9 % 100 mL IVPB, 2 g, Intravenous, Q12H, Demetrios Loll, MD, Stopped at 06/28/18  0230 .  diltiazem (CARDIZEM CD) 24 hr capsule 180 mg, 180 mg, Oral, Daily, Demetrios Loll, MD, 180 mg at 06/28/18 4982 .  enoxaparin (LOVENOX) injection 40 mg, 40 mg, Subcutaneous, Q24H, Demetrios Loll, MD, 40 mg at 06/27/18 2245 .  guaiFENesin-dextromethorphan (ROBITUSSIN DM) 100-10 MG/5ML syrup 5 mL, 5 mL, Oral, Q4H PRN, Demetrios Loll, MD .  HYDROcodone-acetaminophen (NORCO/VICODIN) 5-325 MG per tablet 1-2 tablet, 1-2 tablet, Oral, Q4H PRN, Demetrios Loll, MD .  ipratropium-albuterol (DUONEB) 0.5-2.5 (3) MG/3ML nebulizer solution 3 mL, 3 mL, Nebulization, Q6H, Demetrios Loll, MD, 3 mL at 06/28/18 0800 .  mometasone-formoterol (DULERA) 200-5 MCG/ACT inhaler 2 puff, 2 puff, Inhalation, BID, Demetrios Loll, MD, 2 puff at 06/28/18 [phone] .  ondansetron (ZOFRAN) tablet 4 mg, 4 mg, Oral, Q6H PRN **OR** ondansetron (ZOFRAN) injection 4 mg, 4 mg, Intravenous, Q6H PRN, Demetrios Loll, MD .  senna-docusate (Senokot-S) tablet 1 tablet, 1 tablet, Oral, QHS PRN, Demetrios Loll, MD .  sodium chloride flush (NS) 0.9 % injection 3 mL, 3 mL, Intravenous, Q12H, Demetrios Loll, MD, 3 mL at 06/28/18 [phone] .  sodium chloride  flush (NS) 0.9 % injection 3 mL, 3 mL, Intravenous, PRN, Demetrios Loll, MD .  vancomycin (VANCOCIN) 1,750 mg in sodium chloride 0.9 % 500 mL IVPB, 1,750 mg, Intravenous, Q24H, Shanlever, Pierce Crane, RPH  Facility-Administered Medications Ordered in Other Encounters:  .  sodium chloride flush (NS) 0.9 % injection 10 mL, 10 mL, Intracatheter, PRN, Sindy Guadeloupe, MD .  sodium chloride flush (NS) 0.9 % injection 10 mL, 10 mL, Intravenous, PRN, Sindy Guadeloupe, MD, 10 mL at 06/14/18 0907   Physical exam:  Vitals:   06/27/18 1650 06/27/18 1936 06/28/18 0420 06/28/18 0839  BP: 117/79 118/76 125/82 (!) 132/93  Pulse: 99 98 97 (!) 105  Resp: 20 (!) 26 18 (!) 22  Temp: 98.3 F (36.8 C) (!) 97.5 F (36.4 C) 97.7 F (36.5 C) (!) 97.4 F (36.3 C)  TempSrc: Oral Oral Oral Oral  SpO2: 97% 97% 96% 97%  Weight:      Height:       Physical Exam HENT:     Head: Normocephalic and atraumatic.  Eyes:     Pupils: Pupils are equal, round, and reactive to light.  Neck:     Musculoskeletal: Normal range of motion.  Cardiovascular:     Rate and Rhythm: Normal rate and regular rhythm.     Heart sounds: Normal heart sounds.  Pulmonary:     Comments: Effort increased. Breath sounds decreased over bases Abdominal:     General: Bowel sounds are normal.     Palpations: Abdomen is soft.  Skin:    General: Skin is warm and dry.  Neurological:     Mental Status: He is alert and oriented to person, place, and time.        CMP Latest Ref Rng & Units 06/28/2018  Glucose 70 - 99 mg/dL 194(H)  BUN 8 - 23 mg/dL 16  Creatinine 0.61 - 1.24 mg/dL 0.66  Sodium 135 - 145 mmol/L 134(L)  Potassium 3.5 - 5.1 mmol/L 3.9  Chloride 98 - 111 mmol/L 104  CO2 22 - 32 mmol/L 24  Calcium 8.9 - 10.3 mg/dL 8.1(L)  Total Protein 6.5 - 8.1 g/dL -  Total Bilirubin 0.3 - 1.2 mg/dL -  Alkaline Phos 38 - 126 U/L -  AST 15 - 41 U/L -  ALT 0 - 44 U/L -   CBC Latest Ref Rng & Units 06/28/2018  WBC 4.0 - 10.5 K/uL 7.7    Hemoglobin 13.0 - 17.0 g/dL 8.9(L)  Hematocrit 39.0 - 52.0 % 27.7(L)  Platelets 150 - 400 K/uL 154    _0 @  Dg Chest 2 View  Result Date: 06/27/2018 CLINICAL DATA:  Shortness of breath on exertion since yesterday afternoon. History of lung cancer. EXAM: CHEST - 2 VIEW COMPARISON:  06/15/2018 and CT 06/07/2018 FINDINGS: Right IJ Port-A-Cath unchanged. Lungs are adequately inflated demonstrate worsening patchy airspace process bilaterally most prominent over the right mid to lower lung likely ongoing infection. No significant effusion. Cardiomediastinal silhouette and remainder of the exam is unchanged. IMPRESSION: Interval worsening patchy bilateral airspace process most prominent over the right mid to lower lung likely multifocal pneumonia. Electronically Signed   By: Marin Olp M.D.   On: 06/27/2018 11:49   Ct Angio Chest Pe W Or Wo Contrast  Result Date: 06/07/2018 CLINICAL DATA:  73 year old male with history of adenocarcinoma of the lung. Shortness of breath. Supraventricular tachycardia. EXAM: CT ANGIOGRAPHY CHEST WITH CONTRAST TECHNIQUE: Multidetector CT imaging of the chest was performed using the standard protocol during bolus administration of intravenous contrast. Multiplanar CT image reconstructions and MIPs were obtained to evaluate the vascular anatomy. CONTRAST:  81m OMNIPAQUE IOHEXOL 350 MG/ML SOLN COMPARISON:  PET-CT 03/23/2018.  Chest CT 03/17/2018. FINDINGS: Cardiovascular: No filling defects within the pulmonary arterial tree to suggest underlying pulmonary embolism. Heart size is mildly enlarged. There is aortic atherosclerosis, as well as atherosclerosis of the great vessels of the mediastinum and the coronary arteries, including calcified atherosclerotic plaque in the left main and left anterior descending coronary arteries. Right internal jugular single-lumen porta cath with tip terminating in the distal superior vena cava. Mediastinum/Nodes: Multiple prominent  borderline enlarged and enlarged mediastinal and hilar lymph nodes, largest of which is a subcarinal lymph node measuring up to 2.1 cm in short axis. Esophagus is unremarkable in appearance. Esophagus is unremarkable in appearance. No axillary lymphadenopathy. Lungs/Pleura: Widespread but patchy areas of ground-glass attenuation and septal thickening scattered throughout the lungs bilaterally, significantly increased compared to the prior study from October 2019. Scattered areas of cylindrical and mild varicose bronchiectasis. Some thick-walled cystic changes are noted in the right upper lobe. Volume loss and scarring in the right middle lobe. Trace bilateral pleural effusions. Upper Abdomen: Unremarkable. Musculoskeletal: There are no aggressive appearing lytic or blastic lesions noted in the visualized portions of the skeleton. Review of the MIP images confirms the above findings. IMPRESSION: 1. No evidence of pulmonary embolism. 2. The appearance the chest suggests severe multilobar pneumonia. There also scattered areas of what appear to be post infectious or inflammatory scarring in the lungs bilaterally. 3. Aortic atherosclerosis, in addition to left main and left anterior descending coronary artery disease. Assessment for potential risk factor modification, dietary therapy or pharmacologic therapy may be warranted, if clinically indicated. Aortic Atherosclerosis (ICD10-I70.0). Electronically Signed   By: DVinnie LangtonM.D.   On: 06/07/2018 19:20   Dg Chest Portable 1 View  Result Date: 06/15/2018 CLINICAL DATA:  Shortness of breath. EXAM: PORTABLE CHEST 1 VIEW COMPARISON:  Radiographs and CT 06/07/2018 FINDINGS: Right chest port in the mid SVC. Unchanged heart size and mediastinal contours. Mild chronic volume loss in the right lung. Diffuse interstitial opacities corresponding to ground-glass opacities on CT. Slight improvement in the left lung from prior exam. Small pleural effusions on CT are not seen  radiographically. No pneumothorax. Unchanged osseous structures. IMPRESSION: 1. Diffuse lung opacities with slight improvement in the left  lung from prior exam. Unchanged appearance of the right hemithorax. CT findings suspicious for pneumonia. 2. No new abnormalities. Electronically Signed   By: Keith Rake M.D.   On: 06/15/2018 21:32   Dg Chest Portable 1 View  Result Date: 06/07/2018 CLINICAL DATA:  Two day history of dizziness and weakness. History of lung malignancy and hypertension. Discontinued smoking in October 2019 EXAM: PORTABLE CHEST 1 VIEW COMPARISON:  CT scan of the chest of March 17, 2018 and PET-CT study of March 23, 2018. FINDINGS: The lungs are well-expanded. The interstitial markings are coarse especially in the right infrahilar region. There is no alveolar infiltrate. The heart is top-normal in size. The pulmonary vascularity is not engorged. External pacemaker defibrillator pads are present. A porta catheter is present with the tip projecting over the distal third of the SVC. IMPRESSION: No focal pneumonia. Mild interstitial prominence bilaterally which is likely chronic. Top-normal cardiac size without pulmonary edema. Electronically Signed   By: David  Martinique M.D.   On: 06/07/2018 09:45    Assessment and plan- Patient is a 73 y.o. 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  He received 4 cycles of chemoradiation in December 2019 followed by a brief break and was supposed to restart chemoradiation.  He has not been able to do that however due to repeated admissions for pneumonia and he is currently admitted the hospital for the third time with acute hypoxic respiratory failure possibly secondary to healthcare associated pneumonia  Patient has had repeated admissions for presumed multifocal healthcare associated pneumonia and this is his third admission.  Every time he gets treated with a few days of IV antibiotics and gets discharged on oral  antibiotics.  Symptomatically he does well for a few days and gets readmitted again for similar symptoms.  I would therefore like him to get evaluated by pulmonary to see if a repeat bronchoscopy is warranted at this time to see if this is truly an infectious process versus worsening lymphangitic spread from his lung cancer versus possible radiation pneumonitis. I have noted recommendations by Dr. Ashby Dawes who is suspecting acute interstitial pneumonia and possible radiation pneumonitis. He is on high dose steroids. Radiation will not be restarted at this time. I will review his case at tumor board this week. If he cannot get more RT, I will consider 2 cycles of chemo if he can tolerate it 2 weeks from now  Patient has not received chemotherapy for more than 4 weeks now.  He is still significantly anemic likely secondary to acute infection as well as prior chemotherapy.  Last chemotherapy was on 05/17/2018.  His anemia work-up has been unremarkable.  Continue to monitor and transfuse if H&H less than 7/21     Visit Diagnosis 1. HCAP (healthcare-associated pneumonia)   2. Sepsis, due to unspecified organism, unspecified whether acute organ dysfunction present [facility])     Dr. Randa Evens, MD, MPH [facility] [phone] 06/28/2018  4:01 PM

## 2018-06-28 NOTE — Consult Note (Signed)
Pharmacy Antibiotic Note  Philip Weiss is a 73 y.o. male admitted on 06/27/2018 with pneumonia.  Pharmacy has been consulted for vancomycin and cefepime dosing. The patient was just discharged from this hospital after treatment for pneumonia. He received a single dose of vancomycin but the majority of his treatment during that visit was with cefepime. He was discharged with doxycycline on 06/19/18.  Plan:  Original dose Vancomycin 1250 mg IV loading dose, then 1250 mg Q 12 hrs.  With Goal AUC 400-550. Expected AUC: 544.3 SCr used: 0.61  New dose using Scr 0.8 1750mg  q24h with expected AUC 503.1  (Last dose of 1250mg  given 1/20 ~2200 - New Dose 1750mg  to start 1/21 @ 1500)  2) cefepime 2 grams IV every 12 hours   Height: 5\' 9"  (175.3 cm) Weight: 151 lb 14.4 oz (68.9 kg) IBW/kg (Calculated) : 70.7  Temp (24hrs), Avg:97.8 F (36.6 C), Min:97.5 F (36.4 C), Max:98.3 F (36.8 C)  Recent Labs  Lab 06/21/18 0829 06/27/18 1055 06/27/18 1258 06/28/18 0436  WBC 13.5* 10.7*  --  7.7  CREATININE 0.72 0.61  --  0.66  LATICACIDVEN  --   --  1.5  --     Estimated Creatinine Clearance: 78.9 mL/min (by C-G formula based on SCr of 0.66 mg/dL).    Antimicrobials this admission: vancomycin 1/20 >>  cefepime 1/20 >>   Microbiology results: 1/20 BCx: pending  Thank you for allowing pharmacy to be a part of this patient's care.  Lu Duffel, PharmD, BCPS Clinical Pharmacist 06/28/2018 7:54 AM

## 2018-06-28 NOTE — Progress Notes (Signed)
Pt a/o. No resp distress pt on home 02  chronic  06/28/18 1002  MEWS Assessment  Is this an acute change? No

## 2018-06-28 NOTE — Care Management Note (Signed)
Case Management Note  Patient Details  Name: OMIR COOPRIDER MRN: [id number] Date of Birth: [birth year]  Subjective/Objective:   Admitted to [facility] with the diagnosis of pneumonia. Discharged from this facility 06/19/18. Lives with spouse. Goes to [facility] for physician services and prescriptions.  Home Health arranged per [facility] from last visit. Home oxygen per [facility].  Takes care of all basic activities of daily living himself.                  Action/Plan: Physical therapy is recommending home with home health/physical therapy. [place] query completed. Would like to go with [facility] again.  Floydene Flock, [facility] representative updated.   Expected Discharge Date:                  Expected Discharge Plan:     In-House Referral:    yes Discharge planning Services   yes  Post Acute Care Choice:   yes Choice offered to:   patient  DME Arranged:    DME Agency:     HH Arranged:   yes HH Agency:   [facility]  Status of Service:     If discussed at [place] of Stay Meetings, dates discussed:    Additional Comments:  Shelbie Ammons, RN MSN CCM Care Management [phone] 06/28/2018, 10:47 AM

## 2018-06-28 NOTE — Evaluation (Signed)
Physical Therapy Evaluation Patient Details Name: Philip Weiss MRN: [id number] DOB: 29-Nov-1945 Today's Date: 06/28/2018   History of Present Illness  73 y.o. male here with pneumonia, was here 2 weeks ago and was sent home with O2.  Pt. PMHx includes: Lung CA, HTN, CVA.  Clinical Impression  Pt generally well appearing and eager to participate however even at rest on 2 liters his HR was near 120 and O2 in the high 80s. He was able to get sats to the low 90s with purposeful breathing, but struggled to keep O2 in the 90s, especially with ambulation.  He did not have any overt safety/LOBs issues with ambulation but with ~75 ft and 4 steps he needed multiple standing rest breaks, consistent cuing for breathing and gave a lot of education and answered questions regarding PT and general cardio/pulm issues.  Pt's HR increased to ~130 with the effort and O2 sats as low as 82% on 2 liters, both numbers improved some back at rest in bed, but numbers continued to not be ideal even at rest. Pt physically safe to manage in the home, but is far from his baseline and would benefit from HHPT once medically cleared for d/c.     Follow Up Recommendations Home health PT    Equipment Recommendations  None recommended by PT(increased O2 burden?)    Recommendations for Other Services       Precautions / Restrictions Precautions Precautions: Fall Restrictions Weight Bearing Restrictions: No      Mobility  Bed Mobility Overal bed mobility: Independent                Transfers Overall transfer level: Independent               General transfer comment: No issues with getting to standing and maintaining balance  Ambulation/Gait Ambulation/Gait assistance: Modified independent (Device/Increase time) Gait Distance (Feet): 75 Feet Assistive device: None       General Gait Details: Pt able to ambulate w/o LOBs, AD, safety issues however he fatigued very quickly and needed multiple standing  rest breaks with relatively modest bout of ambulation.  HR generall >120 (up to 130) t/o the effort and sats dropped from low 90s down to low 80s (on 2 liters)  Stairs Stairs: Yes Stairs assistance: Supervision Stair Management: One rail Right Number of Stairs: 4 General stair comments: Pt was able to negotiate up/down steps safely and confidently - though he did fatigue with the effort.  Wheelchair Mobility    Modified Rankin (Stroke Patients Only)       Balance Overall balance assessment: Independent                                           Pertinent Vitals/Pain Pain Assessment: No/denies pain    Home Living Family/patient expects to be discharged to:: Private residence Living Arrangements: Spouse/significant other Available Help at Discharge: Family Type of Home: House Home Access: Stairs to enter Entrance Stairs-Rails: Psychiatric nurse of Steps: 4 Home Layout: One level [place]: [place] - single point      Prior Function Level of Independence: Independent         Comments: Pt. was independent with ADLs, and IADLs. Pt. does not drive, and his wife assist with medication management.     Hand Dominance        Extremity/Trunk Assessment   Upper Extremity  Assessment Upper Extremity Assessment: Overall WFL for tasks assessed(mild L sided weakness)    Lower Extremity Assessment Lower Extremity Assessment: Overall WFL for tasks assessed(mild L sided weakness)       Communication   Communication: No difficulties  Cognition Arousal/Alertness: Awake/alert Behavior During Therapy: WFL for tasks assessed/performed Overall Cognitive Status: Within Functional Limits for tasks assessed                                        General Comments      Exercises     Assessment/Plan    PT Assessment Patient needs continued PT services  PT Problem List Decreased activity tolerance;Cardiopulmonary status  limiting activity;Decreased strength;Decreased safety awareness       PT Treatment Interventions Gait training;Stair training;Functional mobility training;Patient/family education;Therapeutic exercise;Therapeutic activities;Balance training    PT Goals (Current goals can be found in the Care Plan section)  Acute Rehab PT Goals Patient Stated Goal: get breathing better, back to being active PT Goal Formulation: With patient Time For Goal Achievement: 07/12/18 Potential to Achieve Goals: Good    Frequency Min 2X/week   Barriers to discharge        Co-evaluation               AM-PAC PT "6 Clicks" Mobility  Outcome Measure Help needed turning from your back to your side while in a flat bed without using bedrails?: None Help needed moving from lying on your back to sitting on the side of a flat bed without using bedrails?: None Help needed moving to and from a bed to a chair (including a wheelchair)?: None Help needed standing up from a chair using your arms (e.g., wheelchair or bedside chair)?: None Help needed to walk in hospital room?: A Little Help needed climbing 3-5 steps with a railing? : A Little 6 Click Score: 22    End of Session Equipment Utilized During Treatment: Gait belt;Oxygen(2 liters) Activity Tolerance: Patient limited by fatigue Patient left: with bed alarm set;with call bell/phone within reach;with family/visitor present Nurse Communication: Mobility status(O2 and HR with activity) PT Visit Diagnosis: Muscle weakness (generalized) (M62.81)    Time: 7096-2836 PT Time Calculation (min) (ACUTE ONLY): 27 min   Charges:   PT Evaluation $PT Eval Low Complexity: 1 Low PT Treatments $Gait Training: 8-22 mins        Kreg Shropshire, DPT 06/28/2018, 11:20 AM

## 2018-06-28 NOTE — Consult Note (Signed)
[facility] Consultation      Assessment and Plan:  Acute interstitial pneumonia with acute hypoxic respiratory failure.  Diffuse changes as noted on CT chest from 06/07/2018 - Patient appears to have some degree of underlying pulmonary fibrosis, this may have been exacerbated>> acute interstitial pneumonia due to his underlying disease combined with chemo/rads therapy.  Will treat with high-dose steroids. - Possibility of radiation pneumonits: as above will treat with steroids.  Pneumonia. - Most recent chest x-ray 06/15/2018 shows new right lower lobe infiltrate, which could be consistent with infectious pneumonia.  Continue treatment with broad-spectrum antibiotics.  Stage IIIb adenocarcinoma lung. - Possibility exists for lymphangitic spread of the underlying tumor.  Patient will need continued treatment with chemotherapy per oncology recommendations. - Patient not improving may need bronchoscopy to look for cancer as a cause of the patient's fibrotic changes. - We will need to hold off on radiation therapy due to presence of severe interstitial changes.  Date: 06/28/2018  MRN# [id number] Philip Weiss Oct 20, 1945  Referring Physician: Dr. Janese Weiss for lung mass.  Philip Weiss is a 73 y.o. old male seen in consultation for chief complaint of:    Chief Complaint  Patient presents with  . Shortness of Breath    HPI:   The patient is a 73 year old male smoking patient who has been seen in our office previously by  Dr. Patsey Weiss.  The patient had an abnormal lung cancer screening CAT scan and was seen by oncology in October 2019, which showed a right middle lobe mass as well as significant mediastinal and right hilar lymphadenopathy.  There is also significant bronchiectatic change in the right middle lobe and lingula as well as subpleural fibrotic changes.  Subsequent PET scan on 03/23/2018 showed significant uptake in the subcarinal and right hilar nodes, as well as the right  middle lobe mass.  He was subsequently referred to pulmonary for bronchoscopy which was completed on 10/22.  This showed non-small cell carcinoma the lung favoring adenocarcinoma, in brush and biopsy from right middle lobe as well as sampling from the subcarinal node testing with stage IIIc adenocarcinoma.  Patient was then started on chemoradiation, with chemotherapy initiated on 04/19/2018, concurrent large field radiation on 05/23/2018.  Presented to the ED on 06/07/2018 with SVT, hyponatremia, pneumonia treated with Cardizem, ceftriaxone azithromycin, prednisone, discharged on 06/10/2018. **Repeat CT chest on 06/07/18>> imaging personally viewed, this showed diffuse interstitial edema in the areas of previously seen early interstitial changes particularly the bilateral upper lobes. Presented back to the infusion clinic on 06/15/2018 found to have low oxygen saturation referred back to the ED, he was found to have persistent opacities on chest x-ray and readmitted to the hospital for pneumonia.  On my review the chest x-ray there appears to be slightly increased infiltrates in the area of the radiation.  He was treated with doxycycline, cefepime, steroids, discharged on 1/12 with further antibiotics and home oxygen and doxycycline, prednisone.  He was seen on 1/14 by oncology, we decided to hold off on reinitiating chemotherapy. On 1/20 patient was referred to the ED from the cancer center because of shortness of breath and low oxygen saturation, with wheezing and cough for the past 2 days. **Chest x-ray 06/27/2018>> imaging personally reviewed, patient appears to have predominantly right middle and right lower lobe increased infiltrates.  In comparison with previous CT images the patient's upper lobe predominant interstitial changes appear to be improved. Currently the patient is on 2 L nasal cannula  PMHX:   Past Medical History:  Diagnosis Date  . CVA (cerebral vascular accident) ([place])   .  Hypertension   . Lung cancer [facility])    Surgical Hx:  Past Surgical History:  Procedure Laterality Date  . CYST EXCISION Left    buttock  . ENDOBRONCHIAL ULTRASOUND Right 03/29/2018   Procedure: ENDOBRONCHIAL ULTRASOUND;  Surgeon: Philip Pita, MD;  Location: [facility] ORS;  Service: Cardiopulmonary;  Laterality: Right;  . FLEXIBLE BRONCHOSCOPY Right 03/29/2018   Procedure: FLEXIBLE BRONCHOSCOPY;  Surgeon: Philip Pita, MD;  Location: [facility] ORS;  Service: Cardiopulmonary;  Laterality: Right;  . PORTA CATH INSERTION N/A 04/13/2018   Procedure: PORTA CATH INSERTION;  Surgeon: Philip Huxley, MD;  Location: [facility];  Service: Cardiovascular;  Laterality: N/A;   Family Hx:  Family History  Problem Relation Age of Onset  . Diabetes Mother   . CAD Mother   . Stroke Father    Social Hx:   Social History   Tobacco Use  . Smoking status: Former Smoker    Packs/day: 1.00    Years: 58.00    Pack years: 58.00    Types: Cigarettes    Last attempt to quit: 03/30/2018    Years since quitting: 0.2  . Smokeless tobacco: Never Used  Substance Use Topics  . Alcohol use: Not Currently    Comment: daily 32 oz beer  . Drug use: Not Currently   Medication:    Current Facility-Administered Medications:  .  0.9 %  sodium chloride infusion, 250 mL, Intravenous, PRN, Philip Loll, MD .  0.9 %  sodium chloride infusion, , Intravenous, Continuous, Philip Loll, MD, Last Rate: 75 mL/hr at 06/28/18 0541 .  acetaminophen (TYLENOL) tablet 650 mg, 650 mg, Oral, Q6H PRN **OR** acetaminophen (TYLENOL) suppository 650 mg, 650 mg, Rectal, Q6H PRN, Philip Loll, MD .  albuterol (PROVENTIL) (2.5 MG/3ML) 0.083% nebulizer solution 2.5 mg, 2.5 mg, Nebulization, Q2H PRN, Philip Loll, MD .  aspirin EC tablet 81 mg, 81 mg, Oral, Daily, Philip Loll, MD, 81 mg at 06/28/18 0865 .  atorvastatin (LIPITOR) tablet 40 mg, 40 mg, Oral, q1800, Philip Loll, MD, 40 mg at 06/27/18 1726 .  bisacodyl (DULCOLAX) EC tablet 5  mg, 5 mg, Oral, Daily PRN, Philip Loll, MD .  ceFEPIme (MAXIPIME) 2 g in sodium chloride 0.9 % 100 mL IVPB, 2 g, Intravenous, Q12H, Philip Loll, MD, Stopped at 06/28/18 0230 .  diltiazem (CARDIZEM CD) 24 hr capsule 180 mg, 180 mg, Oral, Daily, Philip Loll, MD, 180 mg at 06/28/18 7846 .  enoxaparin (LOVENOX) injection 40 mg, 40 mg, Subcutaneous, Q24H, Philip Loll, MD, 40 mg at 06/27/18 2245 .  guaiFENesin-dextromethorphan (ROBITUSSIN DM) 100-10 MG/5ML syrup 5 mL, 5 mL, Oral, Q4H PRN, Philip Loll, MD .  HYDROcodone-acetaminophen (NORCO/VICODIN) 5-325 MG per tablet 1-2 tablet, 1-2 tablet, Oral, Q4H PRN, Philip Loll, MD .  ipratropium-albuterol (DUONEB) 0.5-2.5 (3) MG/3ML nebulizer solution 3 mL, 3 mL, Nebulization, Q6H, Philip Loll, MD, 3 mL at 06/28/18 0800 .  mometasone-formoterol (DULERA) 200-5 MCG/ACT inhaler 2 puff, 2 puff, Inhalation, BID, Philip Loll, MD, 2 puff at 06/28/18 [phone]  ondansetron (ZOFRAN) tablet 4 mg, 4 mg, Oral, Q6H PRN **OR** ondansetron (ZOFRAN) injection 4 mg, 4 mg, Intravenous, Q6H PRN, Philip Loll, MD .  senna-docusate (Senokot-S) tablet 1 tablet, 1 tablet, Oral, QHS PRN, Philip Loll, MD .  sodium chloride flush (NS) 0.9 % injection 3 mL, 3 mL, Intravenous, Q12H, Philip Loll, MD, 3 mL at  06/28/18 7564 .  sodium chloride flush (NS) 0.9 % injection 3 mL, 3 mL, Intravenous, PRN, Philip Loll, MD .  vancomycin (VANCOCIN) 1,750 mg in sodium chloride 0.9 % 500 mL IVPB, 1,750 mg, Intravenous, Q24H, Shanlever, Pierce Crane, RPH  Facility-Administered Medications Ordered in Other Encounters:  .  sodium chloride flush (NS) 0.9 % injection 10 mL, 10 mL, Intracatheter, PRN, Sindy Guadeloupe, MD .  sodium chloride flush (NS) 0.9 % injection 10 mL, 10 mL, Intravenous, PRN, Sindy Guadeloupe, MD, 10 mL at 06/14/18 0907   Allergies:  Aspirin  Review of Systems: Gen:  Denies  fever, sweats, chills HEENT: Denies blurred vision, double vision. bleeds, sore throat Cvc:  No dizziness, chest pain. Resp:    Denies cough or sputum production, shortness of breath Gi: Denies swallowing difficulty, stomach pain. Gu:  Denies bladder incontinence, burning urine Ext:   No Joint pain, stiffness. Skin: No skin rash,  hives  Endoc:  No polyuria, polydipsia. Psych: No depression, insomnia. Other:  All other systems were reviewed with the patient and were negative other that what is mentioned in the HPI.   Physical Examination:   VS: BP (!) 132/93   Pulse (!) 105   Temp (!) 97.4 F (36.3 C) (Oral)   Resp (!) 22   Ht _0  (1.753 m)   Wt 68.9 kg   SpO2 97%   BMI 22.43 kg/m   General Appearance: No distress  Neuro:without focal findings,  speech normal,  HEENT: PERRLA, EOM intact.   Pulmonary: Bibasilar fine crepitations. CardiovascularNormal S1,S2.  No m/r/g.   Abdomen: Benign, Soft, non-tender. Renal:  No costovertebral tenderness  GU:  No performed at this time. Endoc: No evident thyromegaly, no signs of acromegaly. Skin:   warm, no rashes, no ecchymosis  Extremities: normal, no cyanosis, clubbing.  Other findings:    LABORATORY PANEL:   CBC Recent Labs  Lab 06/28/18 0436  WBC 7.7  HGB 8.9*  HCT 27.7*  PLT 154   ------------------------------------------------------------------------------------------------------------------  Chemistries  Recent Labs  Lab 06/27/18 1233 06/28/18 0436  NA  --  134*  K  --  3.9  CL  --  104  CO2  --  24  GLUCOSE  --  194*  BUN  --  16  CREATININE  --  0.66  CALCIUM  --  8.1*  MG 1.8  --    ------------------------------------------------------------------------------------------------------------------  Cardiac Enzymes Recent Labs  Lab 06/27/18 1055  TROPONINI <0.03   ------------------------------------------------------------  RADIOLOGY:  Dg Chest 2 View  Result Date: 06/27/2018 CLINICAL DATA:  Shortness of breath on exertion since yesterday afternoon. History of lung cancer. EXAM: CHEST - 2 VIEW COMPARISON:  06/15/2018  and CT 06/07/2018 FINDINGS: Right IJ Port-A-Cath unchanged. Lungs are adequately inflated demonstrate worsening patchy airspace process bilaterally most prominent over the right mid to lower lung likely ongoing infection. No significant effusion. Cardiomediastinal silhouette and remainder of the exam is unchanged. IMPRESSION: Interval worsening patchy bilateral airspace process most prominent over the right mid to lower lung likely multifocal pneumonia. Electronically Signed   By: Marin Olp M.D.   On: 06/27/2018 11:49       Thank  you for the consultation and for allowing [facility] to assist in the care of your patient. Our recommendations are noted above.  Please contact us if we can be of further service.   Marda Stalker, M.D., F.C.C.P.  Board Certified in Internal Medicine, Pulmonary Medicine, [place], and  Sleep Medicine.   Pulmonary and Critical Care Office Number: [phone]   06/28/2018

## 2018-06-28 NOTE — Consult Note (Signed)
[place]  [phone] [phone]   Name: Philip Weiss Date: 06/28/2018 MRN: [id number]  DOB: [birth year]  Patient Care Team: [facility], [place] as PCP - General ([place]) Telford Nab, RN as Registered Nurse    REASON FOR CONSULTATION: Palliative Care consult requested for this 73 y.o. male with multiple medical problems including stage III lung cancer on treatment status post first round of chemoradiation in December 2019 and then restarted radiation in January 2020 but chemotherapy was held due to shortness of breath and anemia.  PMH also notable for hypertension and history of CVA.  He has several recent hospitalizations including 06/07/2018 to 06/10/2018 with SVT and was found to have multifocal pneumonia and possible radiation pneumonitis.  He was hospitalized again 06/16/2018 to 06/19/2018 again with acute respiratory failure and hypoxia secondary to pneumonia versus COPD exacerbation.  He is now readmitted 06/27/2018 with same.  Altogether, patient has been hospitalized 6 times in past 3 months.  Palliative care was consulted to help address goals.   SOCIAL HISTORY:     reports that he quit smoking about 2 months ago. His smoking use included cigarettes. He has a 58.00 pack-year smoking history. He has never used smokeless tobacco. He reports previous alcohol use. He reports previous drug use.   Patient lives at home with his wife.  He has 4 children but only a daughter lives nearby.  Patient used to work in [employer] and then later in [employer].  ADVANCE DIRECTIVES:  Does not have  CODE STATUS: DNR  PAST MEDICAL HISTORY: Past Medical History:  Diagnosis Date  . CVA (cerebral vascular accident) ([place])   . Hypertension   . Lung cancer ([place])     PAST SURGICAL HISTORY:  Past Surgical History:  Procedure Laterality Date  . CYST EXCISION Left    buttock  . ENDOBRONCHIAL ULTRASOUND Right  03/29/2018   Procedure: ENDOBRONCHIAL ULTRASOUND;  Surgeon: Tyler Pita, MD;  Location: [facility] ORS;  Service: Cardiopulmonary;  Laterality: Right;  . FLEXIBLE BRONCHOSCOPY Right 03/29/2018   Procedure: FLEXIBLE BRONCHOSCOPY;  Surgeon: Tyler Pita, MD;  Location: [facility] ORS;  Service: Cardiopulmonary;  Laterality: Right;  . PORTA CATH INSERTION N/A 04/13/2018   Procedure: PORTA CATH INSERTION;  Surgeon: Algernon Huxley, MD;  Location: [place] [facility];  Service: Cardiovascular;  Laterality: N/A;    HEMATOLOGY/ONCOLOGY HISTORY:    Lung cancer ([place])   04/05/2018 Initial Diagnosis    Lung cancer ([place])    04/05/2018 Cancer Staging    Staging form: Lung, AJCC 8th Edition - Clinical stage from 04/05/2018: cT2, cN3, cM0 - Signed by Sindy Guadeloupe, MD on 04/05/2018    04/19/2018 -  Chemotherapy    The patient had palonosetron (ALOXI) injection 0.25 mg, 0.25 mg, Intravenous,  Once, 5 of 8 cycles Administration: 0.25 mg (04/27/2018), 0.25 mg (05/03/2018), 0.25 mg (04/19/2018), 0.25 mg (05/10/2018), 0.25 mg (05/17/2018) CARBOplatin (PARAPLATIN) 180 mg in sodium chloride 0.9 % 250 mL chemo infusion, 180 mg (100 % of original dose 180.6 mg), Intravenous,  Once, 5 of 8 cycles Dose modification:   (original dose 180.6 mg, Cycle 2) Administration: 180 mg (04/27/2018), 180 mg (05/03/2018), 180 mg (04/19/2018), 180 mg (05/10/2018), 180 mg (05/17/2018) PACLitaxel (TAXOL) 84 mg in sodium chloride 0.9 % 250 mL chemo infusion (</= 63m/m2), 45 mg/m2 = 84 mg, Intravenous,  Once, 5 of 8 cycles Administration: 84 mg (04/27/2018), 84 mg (05/03/2018), 84 mg (04/19/2018), 84 mg (05/10/2018),  84 mg (05/17/2018)  for chemotherapy treatment.      ALLERGIES:  is allergic to aspirin.  MEDICATIONS:  Current Facility-Administered Medications  Medication Dose Route Frequency Provider Last Rate Last Dose  . 0.9 %  sodium chloride infusion  250 mL Intravenous PRN Demetrios Loll, MD      . 0.9 %  sodium chloride  infusion   Intravenous Continuous Demetrios Loll, MD 75 mL/hr at 06/28/18 0541    . acetaminophen (TYLENOL) tablet 650 mg  650 mg Oral Q6H PRN Demetrios Loll, MD       Or  . acetaminophen (TYLENOL) suppository 650 mg  650 mg Rectal Q6H PRN Demetrios Loll, MD      . albuterol (PROVENTIL) (2.5 MG/3ML) 0.083% nebulizer solution 2.5 mg  2.5 mg Nebulization Q2H PRN Demetrios Loll, MD      . aspirin EC tablet 81 mg  81 mg Oral Daily Demetrios Loll, MD   81 mg at 06/28/18 1007  . atorvastatin (LIPITOR) tablet 40 mg  40 mg Oral q1800 Demetrios Loll, MD   40 mg at 06/27/18 1726  . bisacodyl (DULCOLAX) EC tablet 5 mg  5 mg Oral Daily PRN Demetrios Loll, MD      . ceFEPIme (MAXIPIME) 2 g in sodium chloride 0.9 % 100 mL IVPB  2 g Intravenous Q12H Demetrios Loll, MD   Stopped at 06/28/18 0230  . diltiazem (CARDIZEM CD) 24 hr capsule 180 mg  180 mg Oral Daily Demetrios Loll, MD   180 mg at 06/28/18 1219  . enoxaparin (LOVENOX) injection 40 mg  40 mg Subcutaneous Q24H Demetrios Loll, MD   40 mg at 06/27/18 2245  . guaiFENesin-dextromethorphan (ROBITUSSIN DM) 100-10 MG/5ML syrup 5 mL  5 mL Oral Q4H PRN Demetrios Loll, MD      . HYDROcodone-acetaminophen (NORCO/VICODIN) 5-325 MG per tablet 1-2 tablet  1-2 tablet Oral Q4H PRN Demetrios Loll, MD      . ipratropium-albuterol (DUONEB) 0.5-2.5 (3) MG/3ML nebulizer solution 3 mL  3 mL Nebulization Q6H Demetrios Loll, MD   3 mL at 06/28/18 1359  . methylPREDNISolone sodium succinate (SOLU-MEDROL) 125 mg/2 mL injection 80 mg  80 mg Intravenous Q6H Laverle Hobby, MD      . mometasone-formoterol (DULERA) 200-5 MCG/ACT inhaler 2 puff  2 puff Inhalation BID Demetrios Loll, MD   2 puff at 06/28/18 [phone]  . ondansetron (ZOFRAN) tablet 4 mg  4 mg Oral Q6H PRN Demetrios Loll, MD       Or  . ondansetron [facility]) injection 4 mg  4 mg Intravenous Q6H PRN Demetrios Loll, MD      . senna-docusate (Senokot-S) tablet 1 tablet  1 tablet Oral QHS PRN Demetrios Loll, MD      . sodium chloride flush (NS) 0.9 % injection 3 mL  3 mL Intravenous Q12H  Demetrios Loll, MD   3 mL at 06/28/18 [phone]  . sodium chloride flush (NS) 0.9 % injection 3 mL  3 mL Intravenous PRN Demetrios Loll, MD      . vancomycin (VANCOCIN) 1,750 mg in sodium chloride 0.9 % 500 mL IVPB  1,750 mg Intravenous Q24H Shanlever, Pierce Crane, RPH       Facility-Administered Medications Ordered in Other Encounters  Medication Dose Route Frequency Provider Last Rate Last Dose  . sodium chloride flush (NS) 0.9 % injection 10 mL  10 mL Intracatheter PRN Sindy Guadeloupe, MD      . sodium chloride flush (NS) 0.9 % injection 10 mL  10 mL  Intravenous PRN Sindy Guadeloupe, MD   10 mL at 06/14/18 0907    VITAL SIGNS: BP (!) 132/93   Pulse (!) 105   Temp (!) 97.4 F (36.3 C) (Oral)   Resp (!) 22   Ht _0  (1.753 m)   Wt 151 lb 14.4 oz (68.9 kg)   SpO2 97%   BMI 22.43 kg/m  Filed Weights   06/27/18 1043  Weight: 151 lb 14.4 oz (68.9 kg)    Estimated body mass index is 22.43 kg/m as calculated from the following:   Height as of this encounter: _1  (1.753 m).   Weight as of this encounter: 151 lb 14.4 oz (68.9 kg).  LABS: CBC:    Component Value Date/Time   WBC 7.7 06/28/2018 0436   HGB 8.9 (L) 06/28/2018 0436   HGB 9.3 (L) 12/29/2013 1142   HCT 27.7 (L) 06/28/2018 0436   HCT 28.3 (L) 12/29/2013 1142   PLT 154 06/28/2018 0436   PLT 184 12/29/2013 1142   MCV 95.2 06/28/2018 0436   MCV 112 (H) 12/29/2013 1142   NEUTROABS 11.6 (H) 06/21/2018 0829   LYMPHSABS 0.6 (L) 06/21/2018 0829   MONOABS 1.1 (H) 06/21/2018 0829   EOSABS 0.0 06/21/2018 0829   BASOSABS 0.0 06/21/2018 0829   Comprehensive Metabolic Panel:    Component Value Date/Time   NA 134 (L) 06/28/2018 0436   NA 138 12/29/2013 1142   K 3.9 06/28/2018 0436   K 3.7 12/29/2013 1142   CL 104 06/28/2018 0436   CL 109 (H) 12/29/2013 1142   CO2 24 06/28/2018 0436   CO2 20 (L) 12/29/2013 1142   BUN 16 06/28/2018 0436   BUN 5 (L) 12/29/2013 1142   CREATININE 0.66 06/28/2018 0436   CREATININE 0.98 12/29/2013 1142    GLUCOSE 194 (H) 06/28/2018 0436   GLUCOSE 109 (H) 12/29/2013 1142   CALCIUM 8.1 (L) 06/28/2018 0436   CALCIUM 8.1 (L) 12/29/2013 1142   AST 53 (H) 06/21/2018 0829   ALT 82 (H) 06/21/2018 0829   ALKPHOS 85 06/21/2018 0829   BILITOT 0.5 06/21/2018 0829   PROT 6.3 (L) 06/21/2018 0829   ALBUMIN 2.8 (L) 06/21/2018 0829    RADIOGRAPHIC STUDIES: Dg Chest 2 View  Result Date: 06/27/2018 CLINICAL DATA:  Shortness of breath on exertion since yesterday afternoon. History of lung cancer. EXAM: CHEST - 2 VIEW COMPARISON:  06/15/2018 and CT 06/07/2018 FINDINGS: Right IJ Port-A-Cath unchanged. Lungs are adequately inflated demonstrate worsening patchy airspace process bilaterally most prominent over the right mid to lower lung likely ongoing infection. No significant effusion. Cardiomediastinal silhouette and remainder of the exam is unchanged. IMPRESSION: Interval worsening patchy bilateral airspace process most prominent over the right mid to lower lung likely multifocal pneumonia. Electronically Signed   By: Marin Olp M.D.   On: 06/27/2018 11:49   Ct Angio Chest Pe W Or Wo Contrast  Result Date: 06/07/2018 CLINICAL DATA:  73 year old male with history of adenocarcinoma of the lung. Shortness of breath. Supraventricular tachycardia. EXAM: CT ANGIOGRAPHY CHEST WITH CONTRAST TECHNIQUE: Multidetector CT imaging of the chest was performed using the standard protocol during bolus administration of intravenous contrast. Multiplanar CT image reconstructions and MIPs were obtained to evaluate the vascular anatomy. CONTRAST:  64m OMNIPAQUE IOHEXOL 350 MG/ML SOLN COMPARISON:  PET-CT 03/23/2018.  Chest CT 03/17/2018. FINDINGS: Cardiovascular: No filling defects within the pulmonary arterial tree to suggest underlying pulmonary embolism. Heart size is mildly enlarged. There is aortic atherosclerosis, as well as atherosclerosis  of the great vessels of the mediastinum and the coronary arteries, including calcified  atherosclerotic plaque in the left main and left anterior descending coronary arteries. Right internal jugular single-lumen porta cath with tip terminating in the distal superior vena cava. Mediastinum/Nodes: Multiple prominent borderline enlarged and enlarged mediastinal and hilar lymph nodes, largest of which is a subcarinal lymph node measuring up to 2.1 cm in short axis. Esophagus is unremarkable in appearance. Esophagus is unremarkable in appearance. No axillary lymphadenopathy. Lungs/Pleura: Widespread but patchy areas of ground-glass attenuation and septal thickening scattered throughout the lungs bilaterally, significantly increased compared to the prior study from October 2019. Scattered areas of cylindrical and mild varicose bronchiectasis. Some thick-walled cystic changes are noted in the right upper lobe. Volume loss and scarring in the right middle lobe. Trace bilateral pleural effusions. Upper Abdomen: Unremarkable. Musculoskeletal: There are no aggressive appearing lytic or blastic lesions noted in the visualized portions of the skeleton. Review of the MIP images confirms the above findings. IMPRESSION: 1. No evidence of pulmonary embolism. 2. The appearance the chest suggests severe multilobar pneumonia. There also scattered areas of what appear to be post infectious or inflammatory scarring in the lungs bilaterally. 3. Aortic atherosclerosis, in addition to left main and left anterior descending coronary artery disease. Assessment for potential risk factor modification, dietary therapy or pharmacologic therapy may be warranted, if clinically indicated. Aortic Atherosclerosis (ICD10-I70.0). Electronically Signed   By: Vinnie Langton M.D.   On: 06/07/2018 19:20   Dg Chest Portable 1 View  Result Date: 06/15/2018 CLINICAL DATA:  Shortness of breath. EXAM: PORTABLE CHEST 1 VIEW COMPARISON:  Radiographs and CT 06/07/2018 FINDINGS: Right chest port in the mid SVC. Unchanged heart size and mediastinal  contours. Mild chronic volume loss in the right lung. Diffuse interstitial opacities corresponding to ground-glass opacities on CT. Slight improvement in the left lung from prior exam. Small pleural effusions on CT are not seen radiographically. No pneumothorax. Unchanged osseous structures. IMPRESSION: 1. Diffuse lung opacities with slight improvement in the left lung from prior exam. Unchanged appearance of the right hemithorax. CT findings suspicious for pneumonia. 2. No new abnormalities. Electronically Signed   By: Keith Rake M.D.   On: 06/15/2018 21:32   Dg Chest Portable 1 View  Result Date: 06/07/2018 CLINICAL DATA:  Two day history of dizziness and weakness. History of lung malignancy and hypertension. Discontinued smoking in October 2019 EXAM: PORTABLE CHEST 1 VIEW COMPARISON:  CT scan of the chest of March 17, 2018 and PET-CT study of March 23, 2018. FINDINGS: The lungs are well-expanded. The interstitial markings are coarse especially in the right infrahilar region. There is no alveolar infiltrate. The heart is top-normal in size. The pulmonary vascularity is not engorged. External pacemaker defibrillator pads are present. A porta catheter is present with the tip projecting over the distal third of the SVC. IMPRESSION: No focal pneumonia. Mild interstitial prominence bilaterally which is likely chronic. Top-normal cardiac size without pulmonary edema. Electronically Signed   By: David  Martinique M.D.   On: 06/07/2018 09:45    PERFORMANCE STATUS (ECOG) : 2 - Symptomatic, <50% confined to bed  Review of Systems As noted above. Otherwise, a complete review of systems is negative.  Physical Exam General: NAD, frail appearing, thin Cardiovascular: regular rate and rhythm Pulmonary: coarse ant/post fields Abdomen: soft, nontender, + bowel sounds Extremities: no edema Skin: no rashes Neurological: Weakness but otherwise nonfocal  IMPRESSION: I met today with patient, wife, and  daughter.  Patient admits to  a slow decline over the past few months.  He says he is never fully recovered from his previous hospitalizations.  At baseline, patient lives at home with his wife.  He is mostly independent with his daily care.  However, he becomes short of breath with minimal ambulation.  He was started on oxygen during the last hospitalization.  Both patient and family remain hopeful that his underlying pulmonary issues will improve and that he will be able to restart treatment at some point in the future.  Patient wants to return home at time of discharge.  He would benefit from home health involvement.  Symptomatically, patient reports feeling relatively comfortable without any acute distressing symptoms.  He does endorse dyspnea when he ambulates but says he generally is able to catch his breath with rest.  We discussed energy conservation techniques.  Also recommended cool air temperatures and use of a bedside fan.  Consideration could be given for trial of a low-dose opiate and/or an anxiolytic for palliation of dyspnea.  Patient is pending evaluation by pulmonary due to concern that his unresolved issues could be secondary to multifocal pneumonia versus lymphangitic spread of the cancer. ID consult could also be considered.   Patient is a DNR.  Case discussed with Dr. Janese Banks.  PLAN: Continue current scope of treatment Probably discharge home with home health when medically ready Will follow patient in the clinic   Time Total: 60 minutes  Visit consisted of counseling and education dealing with the complex and emotionally intense issues of symptom management and palliative care in the setting of serious and potentially life-threatening illness.Greater than 50%  of this time was spent counseling and coordinating care related to the above assessment and plan.  Signed by: Altha Harm, PhD, NP-C [phone] (Work Cell)

## 2018-06-28 NOTE — Progress Notes (Signed)
[place] [facility] [place] NAME: Philip Weiss    MR#:  [id number]  DATE OF BIRTH:  Oct 07, 1945  SUBJECTIVE:  Patient without complaint, no events overnight, wife at the bedside, oncology/nurse practitioner requesting pulmonology consultation  REVIEW OF SYSTEMS:  CONSTITUTIONAL: No fever, fatigue or weakness.  EYES: No blurred or double vision.  EARS, NOSE, AND THROAT: No tinnitus or ear pain.  RESPIRATORY: No cough, shortness of breath, wheezing or hemoptysis.  CARDIOVASCULAR: No chest pain, orthopnea, edema.  GASTROINTESTINAL: No nausea, vomiting, diarrhea or abdominal pain.  GENITOURINARY: No dysuria, hematuria.  ENDOCRINE: No polyuria, nocturia,  HEMATOLOGY: No anemia, easy bruising or bleeding SKIN: No rash or lesion. MUSCULOSKELETAL: No joint pain or arthritis.   NEUROLOGIC: No tingling, numbness, weakness.  PSYCHIATRY: No anxiety or depression.   ROS  DRUG ALLERGIES:   Allergies  Allergen Reactions  . Aspirin Itching    Patient states may not be allergic, he was taking too many aspirin and became itchy. As long as he takes one tablet daily, he is fine    VITALS:  Blood pressure (!) 132/93, pulse (!) 105, temperature (!) 97.4 F (36.3 C), temperature source Oral, resp. rate (!) 22, height 5\' 9"  (1.753 m), weight 68.9 kg, SpO2 97 %.  PHYSICAL EXAMINATION:  GENERAL:  73 y.o.-year-old patient lying in the bed with no acute distress.  EYES: Pupils equal, round, reactive to light and accommodation. No scleral icterus. Extraocular muscles intact.  HEENT: Head atraumatic, normocephalic. Oropharynx and nasopharynx clear.  NECK:  Supple, no jugular venous distention. No thyroid enlargement, no tenderness.  LUNGS: Normal breath sounds bilaterally, no wheezing, rales,rhonchi or crepitation. No use of accessory muscles of respiration.  CARDIOVASCULAR: S1, S2 normal. No murmurs, rubs, or gallops.  ABDOMEN: Soft, nontender, nondistended. Bowel  sounds present. No organomegaly or mass.  EXTREMITIES: No pedal edema, cyanosis, or clubbing.  NEUROLOGIC: Cranial nerves II through XII are intact. Muscle strength 5/5 in all extremities. Sensation intact. Gait not checked.  PSYCHIATRIC: The patient is alert and oriented x 3.  SKIN: No obvious rash, lesion, or ulcer.   Physical Exam LABORATORY PANEL:   CBC Recent Labs  Lab 06/28/18 0436  WBC 7.7  HGB 8.9*  HCT 27.7*  PLT 154   ------------------------------------------------------------------------------------------------------------------  Chemistries  Recent Labs  Lab 06/27/18 1233 06/28/18 0436  NA  --  134*  K  --  3.9  CL  --  104  CO2  --  24  GLUCOSE  --  194*  BUN  --  16  CREATININE  --  0.66  CALCIUM  --  8.1*  MG 1.8  --    ------------------------------------------------------------------------------------------------------------------  Cardiac Enzymes Recent Labs  Lab 06/27/18 1055  TROPONINI <0.03   ------------------------------------------------------------------------------------------------------------------  RADIOLOGY:  Dg Chest 2 View  Result Date: 06/27/2018 CLINICAL DATA:  Shortness of breath on exertion since yesterday afternoon. History of lung cancer. EXAM: CHEST - 2 VIEW COMPARISON:  06/15/2018 and CT 06/07/2018 FINDINGS: Right IJ Port-A-Cath unchanged. Lungs are adequately inflated demonstrate worsening patchy airspace process bilaterally most prominent over the right mid to lower lung likely ongoing infection. No significant effusion. Cardiomediastinal silhouette and remainder of the exam is unchanged. IMPRESSION: Interval worsening patchy bilateral airspace process most prominent over the right mid to lower lung likely multifocal pneumonia. Electronically Signed   By: Marin Olp M.D.   On: 06/27/2018 11:49    ASSESSMENT AND PLAN:  *acute sepsis due to multifocal pneumonia, HAP Continue  pneumonia protocol, empiric  cefepime/vancomycin, follow-up on cultures  *Acute on chronic hypoxic respiratory failure Resolving Continue supplemental oxygen wean as tolerated-on 2 L chronically at home  *Acute hyponatremia Resolving with IV fluids for rehydration  *Hypokalemia Treated  *Chronic Lung cancer S/p chemotherapy and radiation therapy Followed by Dr. Janese Banks  Disposition Home in 1 to 2 days barring any complications  All the records are reviewed and case discussed with Care Management/Social Workerr. Management plans discussed with the patient, family and they are in agreement.  CODE STATUS: dnr  TOTAL TIME TAKING CARE OF THIS PATIENT: 35 minutes.     POSSIBLE D/C IN 1-2 DAYS, DEPENDING ON CLINICAL CONDITION.   Avel Peace Chamari Cutbirth M.D on 06/28/2018   Between 7am to 6pm - Pager - [phone]  After 6pm go to [email or web address] - password [email or web address] [facility]  Office  [phone]  CC: Primary care physician; [facility], [facility]  Note: This dictation was prepared with Dragon dictation along with smaller phrase technology. Any transcriptional errors that result from this process are unintentional.

## 2018-06-29 ENCOUNTER — Ambulatory Visit: Payer: Medicare PPO

## 2018-06-29 DIAGNOSIS — J189 Pneumonia, unspecified organism: Secondary | ICD-10-CM

## 2018-06-29 LAB — GLUCOSE, CAPILLARY
Glucose-Capillary: 220 mg/dL — ABNORMAL HIGH (ref 70–99)
Glucose-Capillary: 173 mg/dL — ABNORMAL HIGH (ref 70–99)

## 2018-06-29 MED ORDER — DILTIAZEM HCL ER COATED BEADS 240 MG PO CP24
240.0000 mg | ORAL_CAPSULE | Freq: Every day | ORAL | Status: DC
  Filled 2018-06-29: qty 1

## 2018-06-29 MED ORDER — DILTIAZEM HCL 30 MG PO TABS
30.0000 mg | ORAL_TABLET | Freq: Four times a day (QID) | ORAL | Status: AC
  Administered 2018-06-29: 17:00:00 30 mg via ORAL
  Filled 2018-06-29: qty 1

## 2018-06-29 NOTE — Progress Notes (Addendum)
[place] [facility] [place] NAME: Philip Weiss    MR#:  [id number]  DATE OF BIRTH:  1945-11-23  SUBJECTIVE:  Patient complains of continued intermittent shortness of breath-improved from admission, pulmonology and oncology input greatly appreciated   REVIEW OF SYSTEMS:  CONSTITUTIONAL: No fever, fatigue or weakness.  EYES: No blurred or double vision.  EARS, NOSE, AND THROAT: No tinnitus or ear pain.  RESPIRATORY: No cough, shortness of breath, wheezing or hemoptysis.  CARDIOVASCULAR: No chest pain, orthopnea, edema.  GASTROINTESTINAL: No nausea, vomiting, diarrhea or abdominal pain.  GENITOURINARY: No dysuria, hematuria.  ENDOCRINE: No polyuria, nocturia,  HEMATOLOGY: No anemia, easy bruising or bleeding SKIN: No rash or lesion. MUSCULOSKELETAL: No joint pain or arthritis.   NEUROLOGIC: No tingling, numbness, weakness.  PSYCHIATRY: No anxiety or depression.   ROS  DRUG ALLERGIES:   Allergies  Allergen Reactions  . Aspirin Itching    Patient states may not be allergic, he was taking too many aspirin and became itchy. As long as he takes one tablet daily, he is fine    VITALS:  Blood pressure (!) 138/91, pulse (!) 119, temperature 98.1 F (36.7 C), temperature source Oral, resp. rate 16, height 5\' 9"  (1.753 m), weight 68.9 kg, SpO2 94 %.  PHYSICAL EXAMINATION:  GENERAL:  73 y.o.-year-old patient lying in the bed with no acute distress.  EYES: Pupils equal, round, reactive to light and accommodation. No scleral icterus. Extraocular muscles intact.  HEENT: Head atraumatic, normocephalic. Oropharynx and nasopharynx clear.  NECK:  Supple, no jugular venous distention. No thyroid enlargement, no tenderness.  LUNGS: Normal breath sounds bilaterally, no wheezing, rales,rhonchi or crepitation. No use of accessory muscles of respiration.  CARDIOVASCULAR: S1, S2 normal. No murmurs, rubs, or gallops.  ABDOMEN: Soft, nontender, nondistended. Bowel  sounds present. No organomegaly or mass.  EXTREMITIES: No pedal edema, cyanosis, or clubbing.  NEUROLOGIC: Cranial nerves II through XII are intact. Muscle strength 5/5 in all extremities. Sensation intact. Gait not checked.  PSYCHIATRIC: The patient is alert and oriented x 3.  SKIN: No obvious rash, lesion, or ulcer.   Physical Exam LABORATORY PANEL:   CBC Recent Labs  Lab 06/28/18 0436  WBC 7.7  HGB 8.9*  HCT 27.7*  PLT 154   ------------------------------------------------------------------------------------------------------------------  Chemistries  Recent Labs  Lab 06/27/18 1233 06/28/18 0436  NA  --  134*  K  --  3.9  CL  --  104  CO2  --  24  GLUCOSE  --  194*  BUN  --  16  CREATININE  --  0.66  CALCIUM  --  8.1*  MG 1.8  --    ------------------------------------------------------------------------------------------------------------------  Cardiac Enzymes Recent Labs  Lab 06/27/18 1055  TROPONINI <0.03   ------------------------------------------------------------------------------------------------------------------  RADIOLOGY:  Dg Chest 2 View  Result Date: 06/27/2018 CLINICAL DATA:  Shortness of breath on exertion since yesterday afternoon. History of lung cancer. EXAM: CHEST - 2 VIEW COMPARISON:  06/15/2018 and CT 06/07/2018 FINDINGS: Right IJ Port-A-Cath unchanged. Lungs are adequately inflated demonstrate worsening patchy airspace process bilaterally most prominent over the right mid to lower lung likely ongoing infection. No significant effusion. Cardiomediastinal silhouette and remainder of the exam is unchanged. IMPRESSION: Interval worsening patchy bilateral airspace process most prominent over the right mid to lower lung likely multifocal pneumonia. Electronically Signed   By: Marin Olp M.D.   On: 06/27/2018 11:49    ASSESSMENT AND PLAN:  *acute sepsis due to multifocal HCAP Resolved Continue  sepsis protocol, empiric cefepime, follow-up  on cultures  *Acute on chronic hypoxic respiratory failure Secondary to multifactorial process that includes lung cancer, pneumonia, and radiation-induced pneumonitis Resolving Continue supplemental oxygen with weaning as tolerated-on 2 L chronically at home, pulmonology input greatly appreciated  *Acute radiation pneumonitis Oncology and pulmonology input greatly appreciated Continue high-dose steroids with tapering as tolerated  *Acute hyponatremia Resolved with IV fluids for rehydration  *Hypokalemia Repleted  *Chronic Lung cancer S/p chemotherapy and radiation therapy Oncology input appreciated, radiation therapy to be halted, will need to continue to follow-up with Dr. Janese Banks status post discharge for continued care/management   Disposition Home in 1 to 2 days barring any complications  All the records are reviewed and case discussed with Care Management/Social Workerr. Management plans discussed with the patient, family and they are in agreement.  CODE STATUS: dnr  TOTAL TIME TAKING CARE OF THIS PATIENT: 35 minutes.     POSSIBLE D/C IN 1-3 DAYS, DEPENDING ON CLINICAL CONDITION.   Avel Peace [place] Vandergriff M.D on 06/29/2018   Between 7am to 6pm - Pager - [phone]  After 6pm go to [email or web address] - password [email or web address] [facility]  Office  [phone]  CC: Primary care physician; Center, [facility]  Note: This dictation was prepared with Dragon dictation along with smaller phrase technology. Any transcriptional errors that result from this process are unintentional.

## 2018-06-29 NOTE — Consult Note (Signed)
Philip Weiss    Assessment and Plan:  Acute interstitial pneumonia with acute hypoxic respiratory failure.  Diffuse changes as noted on CT chest from 06/07/2018. - Patient appears to have  underlying pulmonary fibrosis, this may have been exacerbated>> acute interstitial pneumonia due to his underlying disease combined with chemo/rads therapy.  Continue treatment with high-dose steroids. - Possibility of radiation pneumonits: as above will treat with steroids. - Continue IV steroids for another 24 to 48 hours, after that can consider discharge with tapering dose of steroids.  Pneumonia. - Most recent chest x-ray 06/15/2018 shows new right lower lobe infiltrate, which could be consistent with infectious pneumonia.  Continue treatment with broad-spectrum antibiotics.  Stage IIIb adenocarcinoma lung. - Possibility exists for lymphangitic spread of the underlying tumor.  Patient will need continued treatment with chemotherapy per oncology recommendations. - If patient not improving may need bronchoscopy to look for cancer as a cause of the patient's fibrotic changes. - We will need to hold off on radiation therapy due to presence of severe interstitial changes.  Date: 06/29/2018  MRN# [id number] Philip Weiss 05/02/1946    [name] is a 73 y.o. old male seen in consultation for chief complaint of:    Chief Complaint  Patient presents with  . Shortness of Breath    Synopsis: The patient is a 73 year old male smoking patient who has been seen in our office previously by  Dr. Patsey Berthold.  The patient had an abnormal lung cancer screening CAT scan and was seen by oncology in October 2019, which showed a right middle lobe mass as well as significant mediastinal and right hilar lymphadenopathy.  There is also significant bronchiectatic change in the right middle lobe and lingula as well as subpleural fibrotic changes.  Subsequent PET scan on 03/23/2018 showed significant uptake  in the subcarinal and right hilar nodes, as well as the right middle lobe mass.  He was subsequently referred to pulmonary for bronchoscopy which was completed on 10/22.  This showed non-small cell carcinoma the lung favoring adenocarcinoma, in brush and biopsy from right middle lobe as well as sampling from the subcarinal node testing with stage IIIc adenocarcinoma. Patient was then started on chemoradiation, with chemotherapy initiated on 04/19/2018, concurrent large field radiation on 05/23/2018.  Presented to the ED on 06/07/2018 with SVT, hyponatremia, pneumonia treated with Cardizem, ceftriaxone azithromycin, prednisone, discharged on 06/10/2018. **Repeat CT chest on 06/07/18>> imaging personally viewed, this showed diffuse interstitial edema in the areas of previously seen early interstitial changes particularly the bilateral upper lobes. Presented back to the infusion clinic on 06/15/2018 found to have low oxygen saturation referred back to the ED, he was found to have persistent opacities on chest x-ray and readmitted to the hospital for pneumonia.  On my review the chest x-ray there appears to be slightly increased infiltrates in the area of the radiation.  He was treated with doxycycline, cefepime, steroids, discharged on 1/12 with further antibiotics and home oxygen and doxycycline, prednisone. He was seen on 1/14 by oncology, we decided to hold off on reinitiating chemotherapy. On 1/20 patient was referred to the ED from the cancer center because of shortness of breath and low oxygen saturation, with wheezing and cough for the past 2 days. **Chest x-ray 06/27/2018>> imaging personally reviewed, patient appears to have predominantly right middle and right lower lobe increased infiltrates.  In comparison with previous CT images the patient's upper lobe predominant interstitial changes appear to be improved.  Subjective:  Currently  the patient is on 2 L nasal cannula, he feels that his breathing is  about the same as previous.  He notes no subjective change either better or worse. He remains on broad-spectrum antibiotics, cefepime, vancomycin, duo nebs, Solu-Medrol 80 mg every 6 hours.  Medication:    Current Facility-Administered Medications:  .  0.9 %  sodium chloride infusion, 250 mL, Intravenous, PRN, Demetrios Loll, MD .  0.9 %  sodium chloride infusion, , Intravenous, Continuous, Salary, Montell D, MD, Last Rate: 30 mL/hr at 06/29/18 1309 .  acetaminophen (TYLENOL) tablet 650 mg, 650 mg, Oral, Q6H PRN **OR** acetaminophen (TYLENOL) suppository 650 mg, 650 mg, Rectal, Q6H PRN, Demetrios Loll, MD .  albuterol (PROVENTIL) (2.5 MG/3ML) 0.083% nebulizer solution 2.5 mg, 2.5 mg, Nebulization, Q2H PRN, Demetrios Loll, MD .  aspirin EC tablet 81 mg, 81 mg, Oral, Daily, Demetrios Loll, MD, 81 mg at 06/29/18 4034 .  atorvastatin (LIPITOR) tablet 40 mg, 40 mg, Oral, q1800, Demetrios Loll, MD, 40 mg at 06/28/18 1743 .  bisacodyl (DULCOLAX) EC tablet 5 mg, 5 mg, Oral, Daily PRN, Demetrios Loll, MD .  ceFEPIme (MAXIPIME) 2 g in sodium chloride 0.9 % 100 mL IVPB, 2 g, Intravenous, Q12H, Demetrios Loll, MD, Last Rate: 200 mL/hr at 06/29/18 1333, 2 g at 06/29/18 1333 .  [START ON 06/30/2018] diltiazem (CARDIZEM CD) 24 hr capsule 240 mg, 240 mg, Oral, Daily, Salary, Montell D, MD .  diltiazem (CARDIZEM) tablet 30 mg, 30 mg, Oral, Q6H, Salary, Montell D, MD .  enoxaparin (LOVENOX) injection 40 mg, 40 mg, Subcutaneous, Q24H, Demetrios Loll, MD, 40 mg at 06/28/18 2032 .  guaiFENesin-dextromethorphan (ROBITUSSIN DM) 100-10 MG/5ML syrup 5 mL, 5 mL, Oral, Q4H PRN, Demetrios Loll, MD .  HYDROcodone-acetaminophen (NORCO/VICODIN) 5-325 MG per tablet 1-2 tablet, 1-2 tablet, Oral, Q4H PRN, Demetrios Loll, MD .  ipratropium-albuterol (DUONEB) 0.5-2.5 (3) MG/3ML nebulizer solution 3 mL, 3 mL, Nebulization, Q6H, Demetrios Loll, MD, 3 mL at 06/29/18 1313 .  methylPREDNISolone sodium succinate (SOLU-MEDROL) 125 mg/2 mL injection 80 mg, 80 mg, Intravenous, Q6H,  Laverle Hobby, MD, 80 mg at 06/29/18 0924 .  mometasone-formoterol (DULERA) 200-5 MCG/ACT inhaler 2 puff, 2 puff, Inhalation, BID, Demetrios Loll, MD, 2 puff at 06/29/18 [phone] .  ondansetron (ZOFRAN) tablet 4 mg, 4 mg, Oral, Q6H PRN **OR** ondansetron (ZOFRAN) injection 4 mg, 4 mg, Intravenous, Q6H PRN, Demetrios Loll, MD .  senna-docusate (Senokot-S) tablet 1 tablet, 1 tablet, Oral, QHS PRN, Demetrios Loll, MD .  sodium chloride flush (NS) 0.9 % injection 3 mL, 3 mL, Intravenous, Q12H, Demetrios Loll, MD, 3 mL at 06/29/18 0925 .  sodium chloride flush (NS) 0.9 % injection 3 mL, 3 mL, Intravenous, PRN, Demetrios Loll, MD .  vancomycin (VANCOCIN) 1,750 mg in sodium chloride 0.9 % 500 mL IVPB, 1,750 mg, Intravenous, Q24H, Shanlever, Pierce Crane, [facility], Last Rate: 250 mL/hr at 06/28/18 1845, 1,750 mg at 06/28/18 1845  Facility-Administered Medications Ordered in Other Encounters:  .  sodium chloride flush (NS) 0.9 % injection 10 mL, 10 mL, Intracatheter, PRN, Sindy Guadeloupe, MD .  sodium chloride flush (NS) 0.9 % injection 10 mL, 10 mL, Intravenous, PRN, Sindy Guadeloupe, MD, 10 mL at 06/14/18 0907   Allergies:  Aspirin  Review of Systems:  Constitutional: Feels well. Cardiovascular: Denies chest pain, exertional chest pain.  Pulmonary: Denies hemoptysis, pleuritic chest pain.   The remainder of systems were reviewed and were found to be negative other than what is documented in the HPI.  Physical Examination:   VS: BP 128/87   Pulse (!) 110   Temp 98.1 F (36.7 C) (Oral)   Resp 16   Ht 5\' 9"  (1.753 m)   Wt 68.9 kg   SpO2 96%   BMI 22.43 kg/m   General Appearance: No distress  Neuro:without focal findings, mental status, speech normal, alert and oriented HEENT: PERRLA, EOM intact Pulmonary: Continued bilateral fine basal crackles. CardiovascularNormal S1,S2.  No m/r/g.  Abdomen: Benign, Soft, non-tender, No masses Renal:  No costovertebral tenderness  GU:  No performed at this time. Endoc: No  evident thyromegaly, no signs of acromegaly or Cushing features Skin:   warm, no rashes, no ecchymosis  Extremities: normal, no cyanosis, clubbing.      LABORATORY PANEL:   CBC Recent Labs  Lab 06/28/18 0436  WBC 7.7  HGB 8.9*  HCT 27.7*  PLT 154   ------------------------------------------------------------------------------------------------------------------  Chemistries  Recent Labs  Lab 06/27/18 1233 06/28/18 0436  NA  --  134*  K  --  3.9  CL  --  104  CO2  --  24  GLUCOSE  --  194*  BUN  --  16  CREATININE  --  0.66  CALCIUM  --  8.1*  MG 1.8  --    ------------------------------------------------------------------------------------------------------------------  Cardiac Enzymes Recent Labs  Lab 06/27/18 1055  TROPONINI <0.03   ------------------------------------------------------------  RADIOLOGY:  No results found.     Thank  you for the consultation and for allowing [facility] to assist in the care of your patient. Our recommendations are noted above.  Please contact us if we can be of further service.   Marda Stalker, M.D., F.C.C.P.  Board Certified in Internal Weiss, Pulmonary Weiss, [place], and Sleep Weiss.  [facility] Office Number: [phone]   06/29/2018

## 2018-06-30 ENCOUNTER — Ambulatory Visit: Payer: Medicare PPO

## 2018-06-30 ENCOUNTER — Other Ambulatory Visit: Payer: Medicare PPO

## 2018-06-30 ENCOUNTER — Other Ambulatory Visit: Payer: Self-pay | Admitting: *Deleted

## 2018-06-30 LAB — GLUCOSE, CAPILLARY
Glucose-Capillary: 158 mg/dL — ABNORMAL HIGH (ref 70–99)
Glucose-Capillary: 169 mg/dL — ABNORMAL HIGH (ref 70–99)
Glucose-Capillary: 162 mg/dL — ABNORMAL HIGH (ref 70–99)

## 2018-06-30 MED ORDER — DILTIAZEM HCL ER COATED BEADS 300 MG PO CP24
300.0000 mg | ORAL_CAPSULE | Freq: Every day | ORAL | Status: DC
  Administered 2018-06-30 – 2018-07-01 (×2): 300 mg via ORAL
  Filled 2018-06-30 (×2): qty 1

## 2018-06-30 MED ORDER — IPRATROPIUM-ALBUTEROL 0.5-2.5 (3) MG/3ML IN SOLN
3.0000 mL | Freq: Three times a day (TID) | RESPIRATORY_TRACT | Status: DC
  Administered 2018-06-30 – 2018-07-01 (×4): 3 mL via RESPIRATORY_TRACT
  Filled 2018-06-30 (×5): qty 3

## 2018-06-30 NOTE — Progress Notes (Signed)
Tumor Board Documentation  Philip Weiss was presented by Dr Janese Banks at our Tumor Board on 06/30/2018, which included representatives from radiation oncology, medical oncology, surgical, radiology, pathology, navigation, internal medicine, palliative care, research, nutrition, pulmonology.  Philip Weiss currently presents as a current patient, for discussion, for Philip Weiss with history of the following treatments: neoadjuvant chemotherapy, neoadjuvant radiation.  Additionally, we reviewed previous medical and familial history, history of present illness, and recent lab results along with all available histopathologic and imaging studies. The tumor board considered available treatment options and made the following recommendations:   High dose Steroid therapy  The following procedures/referrals were also placed: No orders of the defined types were placed in this encounter.   Clinical Trial Status: not discussed   Staging used: AJCC Stage Group  National site-specific guidelines NCCN were discussed with respect to the case.  Tumor board is a meeting of clinicians from various specialty areas who evaluate and discuss patients for whom a multidisciplinary approach is being considered. Final determinations in the plan of care are those of the provider(s). The responsibility for follow up of recommendations given during tumor board is that of the provider.   Today's extended care, comprehensive team conference, Philip Weiss was not present for the discussion and was not examined.   Multidisciplinary Tumor Board is a multidisciplinary case peer review process.  Decisions discussed in the Multidisciplinary Tumor Board reflect the opinions of the specialists present at the conference without having examined the patient.  Ultimately, treatment and diagnostic decisions rest with the primary provider(s) and the patient.

## 2018-06-30 NOTE — Progress Notes (Signed)
Physical Therapy Treatment Patient Details Name: Philip Weiss MRN: [id number] DOB: 1945-06-26 Today's Date: 06/30/2018    History of Present Illness 73 y.o. male here with pneumonia, was here 2 weeks ago and was sent home with O2.  Pt. PMHx includes: Lung CA, HTN, CVA.    PT Comments    Pt able to ambulate in room, perform standing and seated there ex with 2 min rest breaks between to allow for O2 recovery.  Pt responded well to seated there ex with focus on deep breathing and reported no pain throughout treatment.  Pt required SBA only and experienced no LOB's.  Pt and pt's wife were open to education and time was allotted to answer questions as needed.  Pt will continue to benefit from skilled PT with focus on strength, HEP and tolerance to activity.   Follow Up Recommendations  Home health PT     Equipment Recommendations  None recommended by PT(increased O2 burden?)    Recommendations for Other Services       Precautions / Restrictions Precautions Precautions: Fall Restrictions Weight Bearing Restrictions: No    Mobility  Bed Mobility Overal bed mobility: Independent                Transfers Overall transfer level: Independent               General transfer comment: Able to stand without difficulty  Ambulation/Gait Ambulation/Gait assistance: Modified independent (Device/Increase time) Gait Distance (Feet): 50 Feet Assistive device: None       General Gait Details: Pt required 2 rest breaks with ambulation due to desat to mid 80's.  Took 2 min to recover to 90%.   Stairs   Stairs assistance: Supervision Stair Management: One rail Right   General stair comments: Pt was able to negotiate up/down steps safely and confidently - though he did fatigue with the effort.   Wheelchair Mobility    Modified Rankin (Stroke Patients Only)       Balance                                            Cognition Arousal/Alertness:  Awake/alert Behavior During Therapy: WFL for tasks assessed/performed Overall Cognitive Status: Within Functional Limits for tasks assessed                                        Exercises Other Exercises Other Exercises: Standing march, partial squat, heel raises x20 with SBA and VC's for form Other Exercises: Seated pillow squeeze with slight trunk extension x10, seated march x20, leanback x10 and leanback with twist x10, Pt's HR:85 BPM, O2: 94% following seated ther ex with focus on deep breathing throughout Other Exercises: Monitoring of O2 and 4, 2 min rest breaks for O2 recovery.    General Comments        Pertinent Vitals/Pain Pain Assessment: No/denies pain    Home Living                      Prior Function            PT Goals (current goals can now be found in the care plan section) Acute Rehab PT Goals Patient Stated Goal: get breathing better, back to being active PT Goal Formulation: With  patient Time For Goal Achievement: 07/12/18 Potential to Achieve Goals: Good    Frequency    Min 2X/week      PT Plan      Co-evaluation              AM-PAC PT "6 Clicks" Mobility   Outcome Measure  Help needed turning from your back to your side while in a flat bed without using bedrails?: None Help needed moving from lying on your back to sitting on the side of a flat bed without using bedrails?: None Help needed moving to and from a bed to a chair (including a wheelchair)?: None Help needed standing up from a chair using your arms (e.g., wheelchair or bedside chair)?: None Help needed to walk in hospital room?: A Little Help needed climbing 3-5 steps with a railing? : A Little 6 Click Score: 22    End of Session Equipment Utilized During Treatment: Gait belt;Oxygen(2 liters) Activity Tolerance: Patient limited by fatigue Patient left: with bed alarm set;with call bell/phone within reach;with family/visitor present Nurse  Communication: Mobility status(O2 and HR with activity) PT Visit Diagnosis: Muscle weakness (generalized) (M62.81)     Time: 3606-7703 PT Time Calculation (min) (ACUTE ONLY): 26 min  Charges:  $Therapeutic Exercise: 8-22 mins $Therapeutic Activity: 8-22 mins                     Roxanne Gates, PT, DPT    Roxanne Gates 06/30/2018, 4:21 PM

## 2018-06-30 NOTE — Plan of Care (Signed)

## 2018-06-30 NOTE — Consult Note (Signed)
[facility]    Assessment and Plan:  Acute interstitial pneumonia/pneumonitis with acute hypoxic respiratory failure. Etiology of above uncertain, could be acute interstitial pneumonia related to point fibrosis, versus radiation pneumonitis versus chemotherapy toxicity.  -Patient has had little benefit with high-dose steroids thus far.  Can switch to oral steroids.  From pulmonary standpoint patient can be discharged tomorrow, will discharge on 60 mg of prednisone daily, decrease by 10 mg weekly until gone. - We will need repeat CT chest and 4 weeks, patient should follow-up with Dr. Patsey Berthold after that (my office will arrange).  Pneumonia. - Most recent chest x-ray 06/15/2018 shows new right lower lobe infiltrate, which could be consistent with infectious pneumonia.  Continue treatment with broad-spectrum antibiotics. - Okay to DC home with a 2-week course of oral antibiotics.  Stage IIIb adenocarcinoma lung. - Possibility exists for lymphangitic spread of the underlying tumor.  Patient will need continued treatment with chemotherapy per oncology recommendations. - Patient would ideally undergo bronchoscopy, however I am doubtful that the patient could tolerate bronchoscopy at this time. - We will need to hold off on radiation therapy due to presence of severe interstitial changes.  Date: 06/30/2018  MRN# [id number] Philip Weiss 10-29-1945    Philip Weiss is a 73 y.o. old male seen in consultation for chief complaint of:    Chief Complaint  Patient presents with  . Shortness of Breath    Synopsis: The patient is a 73 year old male smoking patient who has been seen in our office previously by  Dr. Patsey Berthold.  The patient had an abnormal lung cancer screening CAT scan and was seen by oncology in October 2019, which showed a right middle lobe mass as well as significant mediastinal and right hilar lymphadenopathy.  There is also significant bronchiectatic change in the  right middle lobe and lingula as well as subpleural fibrotic changes.  Subsequent PET scan on 03/23/2018 showed significant uptake in the subcarinal and right hilar nodes, as well as the right middle lobe mass.  He was subsequently referred to pulmonary for bronchoscopy which was completed on 10/22.  This showed non-small cell carcinoma the lung favoring adenocarcinoma, in brush and biopsy from right middle lobe as well as sampling from the subcarinal node testing with stage IIIc adenocarcinoma. Patient was then started on chemoradiation, with chemotherapy initiated on 04/19/2018, concurrent large field radiation on 05/23/2018.  Presented to the ED on 06/07/2018 with SVT, hyponatremia, pneumonia treated with Cardizem, ceftriaxone azithromycin, prednisone, discharged on 06/10/2018. **Repeat CT chest on 06/07/18>> imaging personally viewed, this showed diffuse interstitial edema in the areas of previously seen early interstitial changes particularly the bilateral upper lobes. Presented back to the infusion clinic on 06/15/2018 found to have low oxygen saturation referred back to the ED, he was found to have persistent opacities on chest x-ray and readmitted to the hospital for pneumonia.  On my review the chest x-ray there appears to be slightly increased infiltrates in the area of the radiation.  He was treated with doxycycline, cefepime, steroids, discharged on 1/12 with further antibiotics and home oxygen and doxycycline, prednisone. He was seen on 1/14 by oncology, we decided to hold off on reinitiating chemotherapy. On 1/20 patient was referred to the ED from the cancer center because of shortness of breath and low oxygen saturation, with wheezing and cough for the past 2 days. **Chest x-ray 06/27/2018>> imaging personally reviewed, patient appears to have predominantly right middle and right lower lobe increased infiltrates.  In  comparison with previous CT images the patient's upper lobe predominant interstitial  changes appear to be improved.  Subjective:  Patient remains on Solu-Medrol at high dose.  He notes little improvement in his breathing continues to be on 2 L nasal cannula, though he appears to look better today.  Medication:    Current Facility-Administered Medications:  .  0.9 %  sodium chloride infusion, 250 mL, Intravenous, PRN, Demetrios Loll, MD .  0.9 %  sodium chloride infusion, , Intravenous, Continuous, Salary, Montell D, MD, Last Rate: 30 mL/hr at 06/29/18 1553 .  acetaminophen (TYLENOL) tablet 650 mg, 650 mg, Oral, Q6H PRN **OR** acetaminophen (TYLENOL) suppository 650 mg, 650 mg, Rectal, Q6H PRN, Demetrios Loll, MD .  albuterol (PROVENTIL) (2.5 MG/3ML) 0.083% nebulizer solution 2.5 mg, 2.5 mg, Nebulization, Q2H PRN, Demetrios Loll, MD .  aspirin EC tablet 81 mg, 81 mg, Oral, Daily, Demetrios Loll, MD, 81 mg at 06/30/18 0951 .  atorvastatin (LIPITOR) tablet 40 mg, 40 mg, Oral, q1800, Demetrios Loll, MD, 40 mg at 06/29/18 1651 .  bisacodyl (DULCOLAX) EC tablet 5 mg, 5 mg, Oral, Daily PRN, Demetrios Loll, MD .  ceFEPIme (MAXIPIME) 2 g in sodium chloride 0.9 % 100 mL IVPB, 2 g, Intravenous, Q12H, Vaughan Basta, MD, Last Rate: 200 mL/hr at 06/30/18 1413, 2 g at 06/30/18 1413 .  diltiazem (CARDIZEM CD) 24 hr capsule 300 mg, 300 mg, Oral, Daily, Vaughan Basta, MD, 300 mg at 06/30/18 0951 .  enoxaparin (LOVENOX) injection 40 mg, 40 mg, Subcutaneous, Q24H, Demetrios Loll, MD, 40 mg at 06/29/18 2043 .  guaiFENesin-dextromethorphan (ROBITUSSIN DM) 100-10 MG/5ML syrup 5 mL, 5 mL, Oral, Q4H PRN, Demetrios Loll, MD .  HYDROcodone-acetaminophen (NORCO/VICODIN) 5-325 MG per tablet 1-2 tablet, 1-2 tablet, Oral, Q4H PRN, Demetrios Loll, MD .  ipratropium-albuterol (DUONEB) 0.5-2.5 (3) MG/3ML nebulizer solution 3 mL, 3 mL, Nebulization, TID, Salary, Montell D, MD, 3 mL at 06/30/18 1328 .  methylPREDNISolone sodium succinate (SOLU-MEDROL) 125 mg/2 mL injection 80 mg, 80 mg, Intravenous, Q6H, Laverle Hobby, MD,  80 mg at 06/30/18 1415 .  mometasone-formoterol (DULERA) 200-5 MCG/ACT inhaler 2 puff, 2 puff, Inhalation, BID, Demetrios Loll, MD, 2 puff at 06/30/18 [phone] .  ondansetron (ZOFRAN) tablet 4 mg, 4 mg, Oral, Q6H PRN **OR** ondansetron (ZOFRAN) injection 4 mg, 4 mg, Intravenous, Q6H PRN, Demetrios Loll, MD .  senna-docusate (Senokot-S) tablet 1 tablet, 1 tablet, Oral, QHS PRN, Demetrios Loll, MD .  sodium chloride flush (NS) 0.9 % injection 3 mL, 3 mL, Intravenous, Q12H, Demetrios Loll, MD, 3 mL at 06/30/18 [phone] .  sodium chloride flush (NS) 0.9 % injection 3 mL, 3 mL, Intravenous, PRN, Demetrios Loll, MD  Facility-Administered Medications Ordered in Other Encounters:  .  sodium chloride flush (NS) 0.9 % injection 10 mL, 10 mL, Intracatheter, PRN, Sindy Guadeloupe, MD .  sodium chloride flush (NS) 0.9 % injection 10 mL, 10 mL, Intravenous, PRN, Sindy Guadeloupe, MD, 10 mL at 06/14/18 0907   Allergies:  Aspirin  Review of Systems:  Constitutional: Feels well. Cardiovascular: Denies chest pain, exertional chest pain.  Pulmonary: Denies hemoptysis, pleuritic chest pain.   The remainder of systems were reviewed and were found to be negative other than what is documented in the HPI.    Physical Examination:   VS: BP (!) 136/91 (BP Location: Left Arm)   Pulse (!) 116   Temp 97.8 F (36.6 C) (Oral)   Resp 18   Ht 5\' 9"  (1.753 m)   Wt 68.9  kg   SpO2 97%   BMI 22.43 kg/m   General Appearance: No distress  Neuro:without focal findings, mental status, speech normal, alert and oriented HEENT: PERRLA, EOM intact Pulmonary: No wheezing, No rales  CardiovascularNormal S1,S2.  No m/r/g.  Abdomen: Benign, Soft, non-tender, No masses Renal:  No costovertebral tenderness  GU:  No performed at this time. Endoc: No evident thyromegaly, no signs of acromegaly or Cushing features Skin:   warm, no rashes, no ecchymosis  Extremities: normal, no cyanosis, clubbing.      LABORATORY PANEL:   CBC Recent Labs  Lab  06/28/18 0436  WBC 7.7  HGB 8.9*  HCT 27.7*  PLT 154   ------------------------------------------------------------------------------------------------------------------  Chemistries  Recent Labs  Lab 06/27/18 1233 06/28/18 0436  NA  --  134*  K  --  3.9  CL  --  104  CO2  --  24  GLUCOSE  --  194*  BUN  --  16  CREATININE  --  0.66  CALCIUM  --  8.1*  MG 1.8  --    ------------------------------------------------------------------------------------------------------------------  Cardiac Enzymes Recent Labs  Lab 06/27/18 1055  TROPONINI <0.03   ------------------------------------------------------------  RADIOLOGY:  No results found.     Thank  you for the consultation and for allowing [facility] to assist in the care of your patient. Our recommendations are noted above.  Please contact us if we can be of further service.   Marda Stalker, M.D., F.C.C.P.  Board Certified in Internal Medicine, Pulmonary Medicine, [place], and Sleep Medicine.   Pulmonary and Critical Care Office Number: [phone]   06/30/2018

## 2018-06-30 NOTE — Progress Notes (Signed)
[facility] [place] NAME: Philip Weiss    MR#:  [id number]  DATE OF BIRTH:  17-Sep-1945  SUBJECTIVE:  Patient complains of continued intermittent shortness of breath-improved from admission, pulmonology and oncology input greatly appreciated.  REVIEW OF SYSTEMS:   CONSTITUTIONAL: No fever, fatigue or weakness.  EYES: No blurred or double vision.  EARS, NOSE, AND THROAT: No tinnitus or ear pain.  RESPIRATORY: have cough, shortness of breath, no wheezing or hemoptysis.  CARDIOVASCULAR: No chest pain, orthopnea, edema.  GASTROINTESTINAL: No nausea, vomiting, diarrhea or abdominal pain.  GENITOURINARY: No dysuria, hematuria.  ENDOCRINE: No polyuria, nocturia,  HEMATOLOGY: No anemia, easy bruising or bleeding SKIN: No rash or lesion. MUSCULOSKELETAL: No joint pain or arthritis.   NEUROLOGIC: No tingling, numbness, weakness.  PSYCHIATRY: No anxiety or depression.   ROS  DRUG ALLERGIES:   Allergies  Allergen Reactions  . Aspirin Itching    Patient states may not be allergic, he was taking too many aspirin and became itchy. As long as he takes one tablet daily, he is fine    VITALS:  Blood pressure (!) 136/91, pulse (!) 116, temperature 97.8 F (36.6 C), temperature source Oral, resp. rate 18, height 5\' 9"  (1.753 m), weight 68.9 kg, SpO2 95 %.  PHYSICAL EXAMINATION:  GENERAL:  73 y.o.-year-old patient lying in the bed with no acute distress.  EYES: Pupils equal, round, reactive to light and accommodation. No scleral icterus. Extraocular muscles intact.  HEENT: Head atraumatic, normocephalic. Oropharynx and nasopharynx clear.  NECK:  Supple, no jugular venous distention. No thyroid enlargement, no tenderness.  LUNGS: Normal breath sounds bilaterally, no wheezing, some crepitation. No use of accessory muscles of respiration.  CARDIOVASCULAR: S1, S2 normal. No murmurs, rubs, or gallops.  ABDOMEN: Soft, nontender, nondistended. Bowel sounds  present. No organomegaly or mass.  EXTREMITIES: No pedal edema, cyanosis, or clubbing.  NEUROLOGIC: Cranial nerves II through XII are intact. Muscle strength 3-4/5 in all extremities. Sensation intact. Gait not checked.  PSYCHIATRIC: The patient is alert and oriented x 3.  SKIN: No obvious rash, lesion, or ulcer.   Physical Exam LABORATORY PANEL:   CBC Recent Labs  Lab 06/28/18 0436  WBC 7.7  HGB 8.9*  HCT 27.7*  PLT 154   ------------------------------------------------------------------------------------------------------------------  Chemistries  Recent Labs  Lab 06/27/18 1233 06/28/18 0436  NA  --  134*  K  --  3.9  CL  --  104  CO2  --  24  GLUCOSE  --  194*  BUN  --  16  CREATININE  --  0.66  CALCIUM  --  8.1*  MG 1.8  --    ------------------------------------------------------------------------------------------------------------------  Cardiac Enzymes Recent Labs  Lab 06/27/18 1055  TROPONINI <0.03   ------------------------------------------------------------------------------------------------------------------  RADIOLOGY:  No results found.  ASSESSMENT AND PLAN:  *acute sepsis due to multifocal HCAP Resolved Continue sepsis protocol, empiric cefepime, follow-up on cultures- negative so far.  *Acute on chronic hypoxic respiratory failure Secondary to multifactorial process that includes lung cancer, pneumonia, and radiation-induced pneumonitis Resolving Continue supplemental oxygen with weaning as tolerated-on 2 L chronically at home, pulmonology input greatly appreciated  As per Pulm, may switch to oral tapering steroids soon.  *Acute radiation pneumonitis Oncology and pulmonology input greatly appreciated Continue high-dose steroids with tapering as tolerated  *Acute hyponatremia Resolved with IV fluids for rehydration  *Hypokalemia Repleted  *Chronic Lung cancer S/p chemotherapy and radiation therapy Oncology input appreciated,  radiation therapy to be halted, will  need to continue to follow-up with Dr. Janese Banks status post discharge for continued care/management   Disposition Home in 1 to 2 days barring any complications Still have congestion and secretions today, advised to use incentive spirometer and ambulate  All the records are reviewed and case discussed with Care Management/Social Workerr. Management plans discussed with the patient, family and they are in agreement.  CODE STATUS: DNR  TOTAL TIME TAKING CARE OF THIS PATIENT: 35 minutes.    POSSIBLE D/C IN 1-3 DAYS, DEPENDING ON CLINICAL CONDITION.   Vaughan Basta M.D on 06/30/2018   Between 7am to 6pm - Pager - [phone]  After 6pm go to [email or web address] - password [email or web address] [facility]  Office  [phone]  CC: Primary care physician; Center, [facility]  Note: This dictation was prepared with Dragon dictation along with smaller phrase technology. Any transcriptional errors that result from this process are unintentional.

## 2018-06-30 NOTE — Care Management Important Message (Signed)
Important Message  Patient Details  Name: JATNIEL VERASTEGUI MRN: [id number] Date of Birth: 05-Feb-1946   Medicare Important Message Given:  Yes    Juliann Pulse A Zauria Dombek 06/30/2018, 11:14 AM

## 2018-07-01 ENCOUNTER — Ambulatory Visit: Payer: Medicare PPO

## 2018-07-01 LAB — CREATININE, SERUM
Creatinine, Ser: 0.62 mg/dL (ref 0.61–1.24)
GFR calc Af Amer: >60 mL/min (ref >60)
GFR calc non Af Amer: >60 mL/min (ref >60)

## 2018-07-01 LAB — GLUCOSE, CAPILLARY
Glucose-Capillary: 148 mg/dL — ABNORMAL HIGH (ref 70–99)
Glucose-Capillary: 181 mg/dL — ABNORMAL HIGH (ref 70–99)

## 2018-07-01 MED ORDER — PREDNISONE 10 MG PO TABS: 10.0000 mg | ORAL_TABLET | Freq: Every day | ORAL | 0 refills | Status: AC

## 2018-07-01 MED ORDER — ASCORBIC ACID 250 MG PO TABS: 250.0000 mg | ORAL_TABLET | Freq: Two times a day (BID) | ORAL | 0 refills | Status: AC

## 2018-07-01 MED ORDER — FERROUS SULFATE 325 (65 FE) MG PO TABS
325.0000 mg | ORAL_TABLET | Freq: Two times a day (BID) | ORAL | Status: DC
  Administered 2018-07-01: 325 mg via ORAL
  Filled 2018-07-01: qty 1

## 2018-07-01 MED ORDER — PREDNISONE 20 MG PO TABS: 20.0000 mg | ORAL_TABLET | Freq: Every day | ORAL | 0 refills | Status: AC

## 2018-07-01 MED ORDER — CEFUROXIME AXETIL 250 MG PO TABS: 250.0000 mg | ORAL_TABLET | Freq: Two times a day (BID) | ORAL | 0 refills | Status: AC

## 2018-07-01 MED ORDER — FERROUS SULFATE 325 (65 FE) MG PO TABS: 325.0000 mg | ORAL_TABLET | Freq: Two times a day (BID) | ORAL | 3 refills | Status: AC

## 2018-07-01 MED ORDER — METOPROLOL TARTRATE 25 MG PO TABS: 12.5000 mg | ORAL_TABLET | Freq: Two times a day (BID) | ORAL | 0 refills | Status: AC

## 2018-07-01 MED ORDER — PREDNISONE 10 MG PO TABS: 30.0000 mg | ORAL_TABLET | Freq: Every day | ORAL | 0 refills | Status: AC

## 2018-07-01 MED ORDER — PREDNISONE 20 MG PO TABS: 60.0000 mg | ORAL_TABLET | Freq: Every day | ORAL | 0 refills | Status: AC

## 2018-07-01 MED ORDER — PREDNISONE 50 MG PO TABS: 50.0000 mg | ORAL_TABLET | Freq: Every day | ORAL | 0 refills | Status: AC

## 2018-07-01 MED ORDER — METOPROLOL TARTRATE 25 MG PO TABS
12.5000 mg | ORAL_TABLET | Freq: Two times a day (BID) | ORAL | Status: DC
  Administered 2018-07-01: 09:00:00 12.5 mg via ORAL
  Filled 2018-07-01: qty 1

## 2018-07-01 MED ORDER — VITAMIN C 500 MG PO TABS
250.0000 mg | ORAL_TABLET | Freq: Two times a day (BID) | ORAL | Status: DC
  Administered 2018-07-01: 09:00:00 250 mg via ORAL
  Filled 2018-07-01: qty 1

## 2018-07-01 MED ORDER — PREDNISONE 20 MG PO TABS: 40.0000 mg | ORAL_TABLET | Freq: Every day | ORAL | 0 refills | Status: AC

## 2018-07-01 MED ORDER — PREDNISONE 20 MG PO TABS
60.0000 mg | ORAL_TABLET | Freq: Every day | ORAL | Status: DC
  Administered 2018-07-01: 09:00:00 60 mg via ORAL
  Filled 2018-07-01: qty 3

## 2018-07-01 NOTE — Discharge Summary (Signed)
[facility] NAME: Philip Weiss    MR#:  [id number]  DATE OF BIRTH:  07-23-1945  DATE OF ADMISSION:  06/27/2018 ADMITTING PHYSICIAN: Demetrios Loll, MD  DATE OF DISCHARGE: 07/01/2018   PRIMARY CARE PHYSICIAN: [facility] [place]    ADMISSION DIAGNOSIS:  HCAP (healthcare-associated pneumonia) [J18.9] Sepsis, due to unspecified organism, unspecified whether acute organ dysfunction present ([place]) [A41.9]  DISCHARGE DIAGNOSIS:  Active Problems:   Pneumonia   Palliative care encounter   SECONDARY DIAGNOSIS:   Past Medical History:  Diagnosis Date  . CVA (cerebral vascular accident) (Philip Weiss)   . Hypertension   . Lung cancer [facility])     HOSPITAL COURSE:   *acute sepsis due to multifocal HCAP Resolved Continue sepsis protocol, empiric cefepime, follow-up on cultures- negative so far. Pulmonology suggested 2 weeks of oral antibiotic therapy at discharge for pneumonia.  *Acute on chronic hypoxic respiratory failure Secondary to multifactorial process that includes lung cancer, pneumonia, and radiation-induced pneumonitis Resolving Continue supplemental oxygen with weaning as tolerated-on 2 L chronically at home, pulmonology input greatly appreciated  As per Pulm, may switch to oral tapering steroids soon. Pulmonary suggested to give long tapering steroids start with 60 mg daily for 1 week and taper 10 mg every week and advised to repeat CT scan in 4 weeks as outpatient.  *Acute radiation pneumonitis Oncology and pulmonology input greatly appreciated Continue high-dose steroids with tapering as tolerated Long taper on steroid and avoid radiation therapy for cancer.  *Acute hyponatremia Resolved with IV fluids for rehydration  *Hypokalemia Repleted  *Chronic Lung cancer S/p chemotherapy and radiation therapy Oncology input appreciated, radiation therapy to be halted, will need to continue to follow-up with Dr.  Janese Banks status post discharge for continued care/management   Due to his generalized weakness we are discharging with home health.  DISCHARGE CONDITIONS:   Stable  CONSULTS OBTAINED:  Treatment Team:  Sindy Guadeloupe, MD  DRUG ALLERGIES:   Allergies  Allergen Reactions  . Aspirin Itching    Patient states may not be allergic, he was taking too many aspirin and became itchy. As long as he takes one tablet daily, he is fine    DISCHARGE MEDICATIONS:   Allergies as of 07/01/2018      Reactions   Aspirin Itching   Patient states may not be allergic, he was taking too many aspirin and became itchy. As long as he takes one tablet daily, he is fine      Medication List    STOP taking these medications   predniSONE 10 MG (21) Tbpk tablet Commonly known as:  STERAPRED UNI-PAK 21 TAB Replaced by:  predniSONE 20 MG tablet     TAKE these medications   albuterol 108 (90 Base) MCG/ACT inhaler Commonly known as:  PROVENTIL HFA;VENTOLIN HFA Inhale 2 puffs into the lungs every 6 (six) hours as needed for wheezing or shortness of breath.   ascorbic acid 250 MG tablet Commonly known as:  VITAMIN C Take 1 tablet (250 mg total) by mouth 2 (two) times daily.   aspirin 81 MG EC tablet Take 1 tablet (81 mg total) by mouth daily.   atorvastatin 40 MG tablet Commonly known as:  LIPITOR Take 1 tablet (40 mg total) by mouth daily at 6 PM.   budesonide-formoterol 160-4.5 MCG/ACT inhaler Commonly known as:  SYMBICORT Inhale 2 puffs into the lungs 2 (two) times daily.   cefUROXime 250 MG tablet Commonly known as:  CEFTIN Take 1 tablet (250 mg total) by mouth 2 (two) times daily for 12 days.   diltiazem 300 MG 24 hr capsule Commonly known as:  CARDIZEM CD Take 1 capsule (300 mg total) by mouth daily.   ferrous sulfate 325 (65 FE) MG tablet Take 1 tablet (325 mg total) by mouth 2 (two) times daily with a meal.   guaiFENesin-dextromethorphan 100-10 MG/5ML syrup Commonly known as:   ROBITUSSIN DM Take 5 mLs by mouth every 4 (four) hours as needed for cough.   levalbuterol 1.25 MG/0.5ML nebulizer solution Commonly known as:  XOPENEX Take 1.25 mg by nebulization every 6 (six) hours as needed for wheezing or shortness of breath.   metoprolol tartrate 25 MG tablet Commonly known as:  LOPRESSOR Take 0.5 tablets (12.5 mg total) by mouth 2 (two) times daily.   MULTIVITAMIN PO Take 1 tablet by mouth daily.   predniSONE 20 MG tablet Commonly known as:  DELTASONE Take 3 tablets (60 mg total) by mouth daily with breakfast for 6 days. Start taking on:  July 02, 2018 Replaces:  predniSONE 10 MG (21) Tbpk tablet   predniSONE 50 MG tablet Commonly known as:  DELTASONE Take 1 tablet (50 mg total) by mouth daily with breakfast for 6 days. Start taking on:  July 09, 2018   predniSONE 20 MG tablet Commonly known as:  DELTASONE Take 2 tablets (40 mg total) by mouth daily for 7 days. Start taking on:  July 16, 2018   predniSONE 10 MG tablet Commonly known as:  DELTASONE Take 3 tablets (30 mg total) by mouth daily for 7 days. Start taking on:  July 23, 2018   predniSONE 20 MG tablet Commonly known as:  DELTASONE Take 1 tablet (20 mg total) by mouth daily for 7 days. Start taking on:  July 30, 2018   predniSONE 10 MG tablet Commonly known as:  DELTASONE Take 1 tablet (10 mg total) by mouth daily for 7 days. Start taking on:  August 06, 2018   VITAMIN B-12 PO Take 1 tablet by mouth daily.   VITAMIN B-6 PO Take 1 tablet by mouth daily.        DISCHARGE INSTRUCTIONS:    Follow with primary care physician.  Follow-up with Dr. Patsey Berthold pulmonologist in 2 weeks.  If you experience worsening of your admission symptoms, develop shortness of breath, life threatening emergency, suicidal or homicidal thoughts you must seek medical attention immediately by calling 911 or calling your MD immediately  if symptoms less severe.  You Must read complete  instructions/literature along with all the possible adverse reactions/side effects for all the Medicines you take and that have been prescribed to you. Take any new Medicines after you have completely understood and accept all the possible adverse reactions/side effects.   Please note  You were cared for by a hospitalist during your hospital stay. If you have any questions about your discharge medications or the care you received while you were in the hospital after you are discharged, you can call the unit and asked to speak with the hospitalist on call if the hospitalist that took care of you is not available. Once you are discharged, your primary care physician will handle any further medical issues. Please note that NO REFILLS for any discharge medications will be authorized once you are discharged, as it is imperative that you return to your primary care physician (or establish a relationship with a primary care physician if you do not have one) for your  aftercare needs so that they can reassess your need for medications and monitor your lab values.    Today   CHIEF COMPLAINT:   Chief Complaint  Patient presents with  . Shortness of Breath    HISTORY OF PRESENT ILLNESS:  Tashawn Laswell  is a 73 y.o. male with 2 days of increasing shortness of breath and cough.  He was just discharged from our hospital within the last week after being treated for SVT and multifocal pneumonia.  He went for radiation treatment of his lung cancer today and was noted to be hypoxic with O2 sats down into the mid 80s.  He came to the hospital for evaluation is found here to have persistent pneumonia, and meet sepsis criteria.  Hospitalist called for admission   VITAL SIGNS:  Blood pressure 130/75, pulse (!) 101, temperature 98.1 F (36.7 C), temperature source Oral, resp. rate 20, height 5\' 9"  (1.753 m), weight 68.9 kg, SpO2 97 %.  I/O:    Intake/Output Summary (Last 24 hours) at 07/01/2018 1325 Last data filed  at 07/01/2018 1007 Gross per 24 hour  Intake 720 ml  Output 500 ml  Net 220 ml    PHYSICAL EXAMINATION:   GENERAL:  73 y.o.-year-old patient lying in the bed with no acute distress.  EYES: Pupils equal, round, reactive to light and accommodation. No scleral icterus. Extraocular muscles intact.  HEENT: Head atraumatic, normocephalic. Oropharynx and nasopharynx clear.  NECK:  Supple, no jugular venous distention. No thyroid enlargement, no tenderness.  LUNGS: Normal breath sounds bilaterally, no wheezing, some crepitation. No use of accessory muscles of respiration.  CARDIOVASCULAR: S1, S2 normal. No murmurs, rubs, or gallops.  ABDOMEN: Soft, nontender, nondistended. Bowel sounds present. No organomegaly or mass.  EXTREMITIES: No pedal edema, cyanosis, or clubbing.  NEUROLOGIC: Cranial nerves II through XII are intact. Muscle strength 3-4/5 in all extremities. Sensation intact. Gait not checked.  PSYCHIATRIC: The patient is alert and oriented x 3.  SKIN: No obvious rash, lesion, or ulcer.    DATA REVIEW:   CBC Recent Labs  Lab 06/28/18 0436  WBC 7.7  HGB 8.9*  HCT 27.7*  PLT 154    Chemistries  Recent Labs  Lab 06/27/18 1233 06/28/18 0436 07/01/18 0401  NA  --  134*  --   K  --  3.9  --   CL  --  104  --   CO2  --  24  --   GLUCOSE  --  194*  --   BUN  --  16  --   CREATININE  --  0.66 0.62  CALCIUM  --  8.1*  --   MG 1.8  --   --     Cardiac Enzymes Recent Labs  Lab 06/27/18 1055  TROPONINI <0.03    Microbiology Results  Results for orders placed or performed during the hospital encounter of 06/27/18  Blood Culture (routine x 2)     Status: None (Preliminary result)   Collection Time: 06/27/18 12:45 PM  Result Value Ref Range Status   Specimen Description BLOOD LEFT ANTECUBITAL  Final   Special Requests   Final    BOTTLES DRAWN AEROBIC AND ANAEROBIC Blood Culture adequate volume   Culture   Final    NO GROWTH 4 DAYS Performed at [facility],  [address] [place], [place] [address]    Report Status PENDING  Incomplete  Blood Culture (routine x 2)     Status: None (Preliminary result)  Collection Time: 06/27/18 12:45 PM  Result Value Ref Range Status   Specimen Description BLOOD BLOOD RIGHT FOREARM  Final   Special Requests   Final    BOTTLES DRAWN AEROBIC AND ANAEROBIC Blood Culture results may not be optimal due to an excessive volume of blood received in culture bottles   Culture   Final    NO GROWTH 4 DAYS Performed at [facility], [address] [place], [place] [address]    Report Status PENDING  Incomplete  MRSA PCR Screening     Status: None   Collection Time: 06/28/18  6:14 AM  Result Value Ref Range Status   MRSA by PCR NEGATIVE NEGATIVE Final    Comment:        The GeneXpert MRSA Assay (FDA approved for NASAL specimens only), is one component of a comprehensive MRSA colonization surveillance program. It is not intended to diagnose MRSA infection nor to guide or monitor treatment for MRSA infections. Performed at [facility], [address] [place], [place] [address]     RADIOLOGY:  No results found.  EKG:   Orders placed or performed during the hospital encounter of 06/27/18  . ED EKG  . ED EKG      Management plans discussed with the patient, family and they are in agreement.  CODE STATUS:     Code Status Orders  (From admission, onward)         Start     Ordered   06/27/18 1550  Do not attempt resuscitation (DNR)  Continuous    Question Answer Comment  In the event of cardiac or respiratory ARREST Do not call a "code blue"   In the event of cardiac or respiratory ARREST Do not perform Intubation, CPR, defibrillation or ACLS   In the event of cardiac or respiratory ARREST Use medication by any route, position, wound care, and other measures to relive pain and suffering. May use oxygen, suction and manual treatment of airway obstruction as needed for  comfort.   Comments nurse may pronounce      06/27/18 1550        Code Status History    Date Active Date Inactive Code Status Order ID Comments User Context   06/16/2018 0219 06/19/2018 1843 DNR [id number]  Lance Coon, MD Inpatient   06/07/2018 1236 06/10/2018 1822 DNR [id number]  Loletha Grayer, MD ED   04/07/2018 1647 04/08/2018 2059 DNR [id number]  Loletha Grayer, MD ED    Advance Directive Documentation     Most Recent Value  Type of Advance Directive  Healthcare Power of Attorney, Living will  Pre-existing out of facility DNR order (yellow form or pink MOST form)  -  "MOST" Form in Place?  -      TOTAL TIME TAKING CARE OF THIS PATIENT: 35 minutes.    Vaughan Basta M.D on 07/01/2018 at 1:25 PM  Between 7am to 6pm - Pager - [phone]  After 6pm go to [email or web address] - password [email or web address] [facility]  Office  [phone]  CC: Primary care physician; [facility]   Note: This dictation was prepared with Dragon dictation along with smaller phrase technology. Any transcriptional errors that result from this process are unintentional.

## 2018-07-01 NOTE — Consult Note (Signed)
[facility]    Assessment and Plan: Acute interstitial pneumonia/pneumonitis with acute hypoxic respiratory failure. Etiology of above uncertain, could be acute interstitial pneumonia related to pulmonary fibrosis, versus radiation pneumonitis versus chemotherapy toxicity. Right lower lobe pneumonia.  -Patient has noted little to no benefit with high-dose steroids thus far, however on auscultation he appears to have reduced bilateral crepitations, and he appears to physically look better than he has on admission. -Patient stable for discharge from respiratory standpoint, I would recommend that he be discharged on 60 mg of prednisone daily, wean down by 10 mg weekly until gone. - We will need repeat CT chest and 4 weeks, patient should follow-up with Dr. Patsey Weiss after that (my office will arrange).  Pneumonia. - Most recent chest x-ray 06/15/2018 shows new right lower lobe infiltrate, which could be consistent with infectious pneumonia.  Continue treatment with broad-spectrum antibiotics. - Okay to DC home with a 2-week course of oral antibiotics.  Stage IIIb adenocarcinoma lung. - Possibility exists for lymphangitic spread of the underlying tumor.  Patient will need continued treatment with chemotherapy per oncology recommendations. - Patient would ideally undergo bronchoscopy, however would like to see him do a little better before considering that. - We will need to hold off on radiation therapy due to the presence of severe interstitial changes.  Date: 07/01/2018  MRN# [id number] Philip Weiss 08/03/1945    Philip Weiss is a 73 y.o. old male seen in consultation for chief complaint of:    Chief Complaint  Patient presents with  . Shortness of Breath    Synopsis: The patient is a 73 year old male smoking patient who has been seen in our office previously by  Dr. Patsey Weiss.  The patient had an abnormal lung cancer screening CAT scan and was seen by oncology in  October 2019, which showed a right middle lobe mass as well as significant mediastinal and right hilar lymphadenopathy.  There is also significant bronchiectatic change in the right middle lobe and lingula as well as subpleural fibrotic changes.  Subsequent PET scan on 03/23/2018 showed significant uptake in the subcarinal and right hilar nodes, as well as the right middle lobe mass.  He was subsequently referred to pulmonary for bronchoscopy which was completed on 10/22.  This showed non-small cell carcinoma the lung favoring adenocarcinoma, in brush and biopsy from right middle lobe as well as sampling from the subcarinal node testing with stage IIIc adenocarcinoma. Patient was then started on chemoradiation, with chemotherapy initiated on 04/19/2018, concurrent large field radiation on 05/23/2018.  Presented to the ED on 06/07/2018 with SVT, hyponatremia, pneumonia treated with Cardizem, ceftriaxone azithromycin, prednisone, discharged on 06/10/2018. **Repeat CT chest on 06/07/18>> imaging personally viewed, this showed diffuse interstitial edema in the areas of previously seen early interstitial changes particularly the bilateral upper lobes. Presented back to the infusion clinic on 06/15/2018 found to have low oxygen saturation referred back to the ED, he was found to have persistent opacities on chest x-ray and readmitted to the hospital for pneumonia.  On my review the chest x-ray there appears to be slightly increased infiltrates in the area of the radiation.  He was treated with doxycycline, cefepime, steroids, discharged on 1/12 with further antibiotics and home oxygen and doxycycline, prednisone. He was seen on 1/14 by oncology, we decided to hold off on reinitiating chemotherapy. On 1/20 patient was referred to the ED from the cancer center because of shortness of breath and low oxygen saturation, with wheezing and cough  for the past 2 days. **Chest x-ray 06/27/2018>> imaging personally reviewed, patient  appears to have predominantly right middle and right lower lobe increased infiltrates.  In comparison with previous CT images the patient's upper lobe predominant interstitial changes appear to be improved.  Subjective:  Patient has no new complaints today, he feels that his breathing is okay at rest but become short with activity.  He notes no significant improvement with his breathing.  Medication:    Current Facility-Administered Medications:  .  0.9 %  sodium chloride infusion, 250 mL, Intravenous, PRN, Philip Loll, MD .  0.9 %  sodium chloride infusion, , Intravenous, Continuous, Salary, Philip D, MD, Last Rate: 30 mL/hr at 06/30/18 1710 .  acetaminophen (TYLENOL) tablet 650 mg, 650 mg, Oral, Q6H PRN **OR** acetaminophen (TYLENOL) suppository 650 mg, 650 mg, Rectal, Q6H PRN, Philip Loll, MD .  albuterol (PROVENTIL) (2.5 MG/3ML) 0.083% nebulizer solution 2.5 mg, 2.5 mg, Nebulization, Q2H PRN, Philip Loll, MD .  aspirin EC tablet 81 mg, 81 mg, Oral, Daily, Philip Loll, MD, 81 mg at 07/01/18 0867 .  atorvastatin (LIPITOR) tablet 40 mg, 40 mg, Oral, q1800, Philip Loll, MD, 40 mg at 06/30/18 1733 .  bisacodyl (DULCOLAX) EC tablet 5 mg, 5 mg, Oral, Daily PRN, Philip Loll, MD .  ceFEPIme (MAXIPIME) 2 g in sodium chloride 0.9 % 100 mL IVPB, 2 g, Intravenous, Q12H, Philip Basta, MD, Last Rate: 200 mL/hr at 07/01/18 0102, 2 g at 07/01/18 0102 .  diltiazem (CARDIZEM CD) 24 hr capsule 300 mg, 300 mg, Oral, Daily, Philip Basta, MD, 300 mg at 07/01/18 0900 .  enoxaparin (LOVENOX) injection 40 mg, 40 mg, Subcutaneous, Q24H, Philip Loll, MD, 40 mg at 06/30/18 2154 .  ferrous sulfate tablet 325 mg, 325 mg, Oral, BID WC, Philip Basta, MD, 325 mg at 07/01/18 0901 .  guaiFENesin-dextromethorphan (ROBITUSSIN DM) 100-10 MG/5ML syrup 5 mL, 5 mL, Oral, Q4H PRN, Philip Loll, MD .  HYDROcodone-acetaminophen (NORCO/VICODIN) 5-325 MG per tablet 1-2 tablet, 1-2 tablet, Oral, Q4H PRN, Philip Loll,  MD .  ipratropium-albuterol (DUONEB) 0.5-2.5 (3) MG/3ML nebulizer solution 3 mL, 3 mL, Nebulization, TID, Salary, Philip D, MD, 3 mL at 07/01/18 0740 .  metoprolol tartrate (LOPRESSOR) tablet 12.5 mg, 12.5 mg, Oral, BID, Philip Basta, MD, 12.5 mg at 07/01/18 0901 .  mometasone-formoterol (DULERA) 200-5 MCG/ACT inhaler 2 puff, 2 puff, Inhalation, BID, Philip Loll, MD, 2 puff at 07/01/18 0900 .  ondansetron (ZOFRAN) tablet 4 mg, 4 mg, Oral, Q6H PRN **OR** ondansetron (ZOFRAN) injection 4 mg, 4 mg, Intravenous, Q6H PRN, Philip Loll, MD .  predniSONE (DELTASONE) tablet 60 mg, 60 mg, Oral, Q breakfast, Philip Basta, MD, 60 mg at 07/01/18 0900 .  senna-docusate (Senokot-S) tablet 1 tablet, 1 tablet, Oral, QHS PRN, Philip Loll, MD .  sodium chloride flush (NS) 0.9 % injection 3 mL, 3 mL, Intravenous, Q12H, Philip Loll, MD, 3 mL at 07/01/18 0905 .  sodium chloride flush (NS) 0.9 % injection 3 mL, 3 mL, Intravenous, PRN, Philip Loll, MD .  vitamin C (ASCORBIC ACID) tablet 250 mg, 250 mg, Oral, BID, Philip Basta, MD, 250 mg at 07/01/18 0902  Facility-Administered Medications Ordered in Other Encounters:  .  sodium chloride flush (NS) 0.9 % injection 10 mL, 10 mL, Intracatheter, PRN, Sindy Guadeloupe, MD .  sodium chloride flush (NS) 0.9 % injection 10 mL, 10 mL, Intravenous, PRN, Sindy Guadeloupe, MD, 10 mL at 06/14/18 0907   Allergies:  Aspirin  Review of Systems:  Constitutional: Feels well. Cardiovascular: Denies chest pain, exertional chest pain.  Pulmonary: Denies hemoptysis, pleuritic chest pain.   The remainder of systems were reviewed and were found to be negative other than what is documented in the HPI.    Physical Examination:   VS: BP 130/75   Pulse (!) 101   Temp 98.1 F (36.7 C) (Oral)   Resp 20   Ht 5\' 9"  (1.753 m)   Wt 68.9 kg   SpO2 97%   BMI 22.43 kg/m   General Appearance: No distress  Neuro:without focal findings, mental status, speech normal,  alert and oriented HEENT: PERRLA, EOM intact Pulmonary: Bibasilar fine crackles, appears improved from yesterday. CardiovascularNormal S1,S2.  No m/r/g.  Abdomen: Benign, Soft, non-tender, No masses Renal:  No costovertebral tenderness  GU:  No performed at this time. Endoc: No evident thyromegaly, no signs of acromegaly or Cushing features Skin:   warm, no rashes, no ecchymosis  Extremities: normal, no cyanosis, clubbing.      LABORATORY PANEL:   CBC Recent Labs  Lab 06/28/18 0436  WBC 7.7  HGB 8.9*  HCT 27.7*  PLT 154   ------------------------------------------------------------------------------------------------------------------  Chemistries  Recent Labs  Lab 06/27/18 1233 06/28/18 0436 07/01/18 0401  NA  --  134*  --   K  --  3.9  --   CL  --  104  --   CO2  --  24  --   GLUCOSE  --  194*  --   BUN  --  16  --   CREATININE  --  0.66 0.62  CALCIUM  --  8.1*  --   MG 1.8  --   --    ------------------------------------------------------------------------------------------------------------------  Cardiac Enzymes Recent Labs  Lab 06/27/18 1055  TROPONINI <0.03   ------------------------------------------------------------  RADIOLOGY:  No results found.     Thank  you for the consultation and for allowing [facility] to assist in the care of your patient. Our recommendations are noted above.  Please contact us if we can be of further service.   Marda Stalker, M.Weiss., F.C.C.P.  Board Certified in Internal Medicine, Pulmonary Medicine, [place], and Sleep Medicine.  [facility] Office Number: [phone]   07/01/2018

## 2018-07-01 NOTE — Progress Notes (Signed)
Pt for discharge home/ a/o no distress. Pt on home o2 chronically.  Instructions discussed with pt and wife. meds discussed / presc given and discussed./diet / activity / and f/u discussed. Verbalizes understanding.  Sl x 2 d/cd.  Ready for discharge.

## 2018-07-01 NOTE — Discharge Instructions (Signed)
Community-Acquired Pneumonia, Adult  Pneumonia is an infection of the lungs. It causes swelling in the airways of the lungs. Mucus and fluid may also build up inside the airways.  One type of pneumonia can happen while a person is in a hospital. A different type can happen when a person is not in a hospital (community-acquired pneumonia).   What are the causes?    This condition is caused by germs (viruses, bacteria, or fungi). Some types of germs can be passed from one person to another. This can happen when you breathe in droplets from the cough or sneeze of an infected person.  What increases the risk?  You are more likely to develop this condition if you:   Have a long-term (chronic) disease, such as:  ? Chronic obstructive pulmonary disease (COPD).  ? Asthma.  ? Cystic fibrosis.  ? Congestive heart failure.  ? Diabetes.  ? Kidney disease.   Have HIV.   Have sickle cell disease.   Have had your spleen removed.   Do not take good care of your teeth and mouth (poor dental hygiene).   Have a medical condition that increases the risk of breathing in droplets from your own mouth and nose.   Have a weakened body defense system (immune system).   Are a smoker.   Travel to areas where the germs that cause this illness are common.   Are around certain animals or the places they live.  What are the signs or symptoms?   A dry cough.   A wet (productive) cough.   Fever.   Sweating.   Chest pain. This often happens when breathing deeply or coughing.   Fast breathing or trouble breathing.   Shortness of breath.   Shaking chills.   Feeling tired (fatigue).   Muscle aches.  How is this treated?  Treatment for this condition depends on many things. Most adults can be treated at home. In some cases, treatment must happen in a hospital. Treatment may include:   Medicines given by mouth or through an IV tube.   Being given extra oxygen.   Respiratory therapy.  In rare cases, treatment for very bad pneumonia  may include:   Using a machine to help you breathe.   Having a procedure to remove fluid from around your lungs.  Follow these instructions at home:  Medicines   Take over-the-counter and prescription medicines only as told by your doctor.  ? Only take cough medicine if you are losing sleep.   If you were prescribed an antibiotic medicine, take it as told by your doctor. Do not stop taking the antibiotic even if you start to feel better.  General instructions     Sleep with your head and neck raised (elevated). You can do this by sleeping in a recliner or by putting a few pillows under your head.   Rest as needed. Get at least 8 hours of sleep each night.   Drink enough water to keep your pee (urine) pale yellow.   Eat a healthy diet that includes plenty of vegetables, fruits, whole grains, low-fat dairy products, and lean protein.   Do not use any products that contain nicotine or tobacco. These include cigarettes, e-cigarettes, and chewing tobacco. If you need help quitting, ask your doctor.   Keep all follow-up visits as told by your doctor. This is important.  How is this prevented?  A shot (vaccine) can help prevent pneumonia. Shots are often suggested for:   People   older than 75 years of age.   People older than 75 years of age who:  ? Are having cancer treatment.  ? Have long-term (chronic) lung disease.  ? Have problems with their body's defense system.  You may also prevent pneumonia if you take these actions:   Get the flu (influenza) shot every year.   Go to the dentist as often as told.   Wash your hands often. If you cannot use soap and water, use hand sanitizer.  Contact a doctor if:   You have a fever.   You lose sleep because your cough medicine does not help.  Get help right away if:   You are short of breath and it gets worse.   You have more chest pain.   Your sickness gets worse. This is very serious if:  ? You are an older adult.  ? Your body's defense system is weak.   You  cough up blood.  Summary   Pneumonia is an infection of the lungs.   Most adults can be treated at home. Some will need treatment in a hospital.   Drink enough water to keep your pee pale yellow.   Get at least 8 hours of sleep each night.  This information is not intended to replace advice given to you by your health care provider. Make sure you discuss any questions you have with your health care provider.  Document Released: 11/11/2007 Document Revised: 01/20/2018 Document Reviewed: 01/20/2018  Elsevier Interactive Patient Education  2019 [employer]

## 2018-07-01 NOTE — Progress Notes (Signed)
[facility] [place] NAME: Philip Weiss    MR#:  [id number]  DATE OF BIRTH:  [birth year]  DATE OF ADMISSION:  06/27/2018 ADMITTING PHYSICIAN: Demetrios Loll, MD  DATE OF DISCHARGE: 07/01/2018   PRIMARY CARE PHYSICIAN: [facility] [place]    ADMISSION DIAGNOSIS:  HCAP (healthcare-associated pneumonia) [J18.9] Sepsis, due to unspecified organism, unspecified whether acute organ dysfunction present ([place]) [A41.9]  DISCHARGE DIAGNOSIS:  Active Problems:   Pneumonia   Palliative care encounter   SECONDARY DIAGNOSIS:   Past Medical History:  Diagnosis Date  . CVA (cerebral vascular accident) ([place])   . Hypertension   . Lung cancer [facility])     HOSPITAL COURSE:   *acute sepsis due to multifocal HCAP Resolved Continue sepsis protocol, empiric cefepime, follow-up on cultures- negative so far. Pulmonology suggested 2 weeks of oral antibiotic therapy at discharge for pneumonia.  *Acute on chronic hypoxic respiratory failure Secondary to multifactorial process that includes lung cancer, pneumonia, and radiation-induced pneumonitis Resolving Continue supplemental oxygen with weaning as tolerated-on 2 L chronically at home, pulmonology input greatly appreciated  As per Pulm, may switch to oral tapering steroids soon. Pulmonary suggested to give long tapering steroids start with 60 mg daily for 1 week and taper 10 mg every week and advised to repeat CT scan in 4 weeks as outpatient.  *Acute radiation pneumonitis Oncology and pulmonology input greatly appreciated Continue high-dose steroids with tapering as tolerated Long taper on steroid and avoid radiation therapy for cancer.  *Acute hyponatremia Resolved with IV fluids for rehydration  *Hypokalemia Repleted  *Chronic Lung cancer S/p chemotherapy and radiation therapy Oncology input appreciated, radiation therapy to be halted, will need to continue to follow-up with Dr.  Janese Banks status post discharge for continued care/management   Due to his generalized weakness we are discharging with home health.  DISCHARGE CONDITIONS:   Stable  CONSULTS OBTAINED:  Treatment Team:  Sindy Guadeloupe, MD  DRUG ALLERGIES:   Allergies  Allergen Reactions  . Aspirin Itching    Patient states may not be allergic, he was taking too many aspirin and became itchy. As long as he takes one tablet daily, he is fine    DISCHARGE MEDICATIONS:   Allergies as of 07/01/2018      Reactions   Aspirin Itching   Patient states may not be allergic, he was taking too many aspirin and became itchy. As long as he takes one tablet daily, he is fine      Medication List    STOP taking these medications   predniSONE 10 MG (21) Tbpk tablet Commonly known as:  STERAPRED UNI-PAK 21 TAB Replaced by:  predniSONE 20 MG tablet     TAKE these medications   albuterol 108 (90 Base) MCG/ACT inhaler Commonly known as:  PROVENTIL HFA;VENTOLIN HFA Inhale 2 puffs into the lungs every 6 (six) hours as needed for wheezing or shortness of breath.   ascorbic acid 250 MG tablet Commonly known as:  VITAMIN C Take 1 tablet (250 mg total) by mouth 2 (two) times daily.   aspirin 81 MG EC tablet Take 1 tablet (81 mg total) by mouth daily.   atorvastatin 40 MG tablet Commonly known as:  LIPITOR Take 1 tablet (40 mg total) by mouth daily at 6 PM.   budesonide-formoterol 160-4.5 MCG/ACT inhaler Commonly known as:  SYMBICORT Inhale 2 puffs into the lungs 2 (two) times daily.   cefUROXime 250 MG tablet Commonly known as:  CEFTIN Take 1 tablet (250 mg total) by mouth 2 (two) times daily for 12 days.   diltiazem 300 MG 24 hr capsule Commonly known as:  CARDIZEM CD Take 1 capsule (300 mg total) by mouth daily.   ferrous sulfate 325 (65 FE) MG tablet Take 1 tablet (325 mg total) by mouth 2 (two) times daily with a meal.   guaiFENesin-dextromethorphan 100-10 MG/5ML syrup Commonly known as:   ROBITUSSIN DM Take 5 mLs by mouth every 4 (four) hours as needed for cough.   levalbuterol 1.25 MG/0.5ML nebulizer solution Commonly known as:  XOPENEX Take 1.25 mg by nebulization every 6 (six) hours as needed for wheezing or shortness of breath.   metoprolol tartrate 25 MG tablet Commonly known as:  LOPRESSOR Take 0.5 tablets (12.5 mg total) by mouth 2 (two) times daily.   MULTIVITAMIN PO Take 1 tablet by mouth daily.   predniSONE 20 MG tablet Commonly known as:  DELTASONE Take 3 tablets (60 mg total) by mouth daily with breakfast for 6 days. Start taking on:  July 02, 2018 Replaces:  predniSONE 10 MG (21) Tbpk tablet   predniSONE 50 MG tablet Commonly known as:  DELTASONE Take 1 tablet (50 mg total) by mouth daily with breakfast for 6 days. Start taking on:  July 09, 2018   predniSONE 20 MG tablet Commonly known as:  DELTASONE Take 2 tablets (40 mg total) by mouth daily for 7 days. Start taking on:  July 16, 2018   predniSONE 10 MG tablet Commonly known as:  DELTASONE Take 3 tablets (30 mg total) by mouth daily for 7 days. Start taking on:  July 23, 2018   predniSONE 20 MG tablet Commonly known as:  DELTASONE Take 1 tablet (20 mg total) by mouth daily for 7 days. Start taking on:  July 30, 2018   predniSONE 10 MG tablet Commonly known as:  DELTASONE Take 1 tablet (10 mg total) by mouth daily for 7 days. Start taking on:  August 06, 2018   VITAMIN B-12 PO Take 1 tablet by mouth daily.   VITAMIN B-6 PO Take 1 tablet by mouth daily.        DISCHARGE INSTRUCTIONS:    Follow with primary care physician.  Follow-up with Dr. Patsey Berthold pulmonologist in 2 weeks.  If you experience worsening of your admission symptoms, develop shortness of breath, life threatening emergency, suicidal or homicidal thoughts you must seek medical attention immediately by calling 911 or calling your MD immediately  if symptoms less severe.  You Must read complete  instructions/literature along with all the possible adverse reactions/side effects for all the Medicines you take and that have been prescribed to you. Take any new Medicines after you have completely understood and accept all the possible adverse reactions/side effects.   Please note  You were cared for by a hospitalist during your hospital stay. If you have any questions about your discharge medications or the care you received while you were in the hospital after you are discharged, you can call the unit and asked to speak with the hospitalist on call if the hospitalist that took care of you is not available. Once you are discharged, your primary care physician will handle any further medical issues. Please note that NO REFILLS for any discharge medications will be authorized once you are discharged, as it is imperative that you return to your primary care physician (or establish a relationship with a primary care physician if you do not have one) for your  aftercare needs so that they can reassess your need for medications and monitor your lab values.    Today   CHIEF COMPLAINT:   Chief Complaint  Patient presents with  . Shortness of Breath    HISTORY OF PRESENT ILLNESS:  Philip Weiss  is a 73 y.o. male with 2 days of increasing shortness of breath and cough.  He was just discharged from our hospital within the last week after being treated for SVT and multifocal pneumonia.  He went for radiation treatment of his lung cancer today and was noted to be hypoxic with O2 sats down into the mid 80s.  He came to the hospital for evaluation is found here to have persistent pneumonia, and meet sepsis criteria.  Hospitalist called for admission   VITAL SIGNS:  Blood pressure 130/75, pulse (!) 101, temperature 98.1 F (36.7 C), temperature source Oral, resp. rate 20, height 5\' 9"  (1.753 m), weight 68.9 kg, SpO2 97 %.  I/O:    Intake/Output Summary (Last 24 hours) at 07/01/2018 1319 Last data filed  at 07/01/2018 1007 Gross per 24 hour  Intake 720 ml  Output 500 ml  Net 220 ml    PHYSICAL EXAMINATION:   GENERAL:  73 y.o.-year-old patient lying in the bed with no acute distress.  EYES: Pupils equal, round, reactive to light and accommodation. No scleral icterus. Extraocular muscles intact.  HEENT: Head atraumatic, normocephalic. Oropharynx and nasopharynx clear.  NECK:  Supple, no jugular venous distention. No thyroid enlargement, no tenderness.  LUNGS: Normal breath sounds bilaterally, no wheezing, some crepitation. No use of accessory muscles of respiration.  CARDIOVASCULAR: S1, S2 normal. No murmurs, rubs, or gallops.  ABDOMEN: Soft, nontender, nondistended. Bowel sounds present. No organomegaly or mass.  EXTREMITIES: No pedal edema, cyanosis, or clubbing.  NEUROLOGIC: Cranial nerves II through XII are intact. Muscle strength 3-4/5 in all extremities. Sensation intact. Gait not checked.  PSYCHIATRIC: The patient is alert and oriented x 3.  SKIN: No obvious rash, lesion, or ulcer.    DATA REVIEW:   CBC Recent Labs  Lab 06/28/18 0436  WBC 7.7  HGB 8.9*  HCT 27.7*  PLT 154    Chemistries  Recent Labs  Lab 06/27/18 1233 06/28/18 0436 07/01/18 0401  NA  --  134*  --   K  --  3.9  --   CL  --  104  --   CO2  --  24  --   GLUCOSE  --  194*  --   BUN  --  16  --   CREATININE  --  0.66 0.62  CALCIUM  --  8.1*  --   MG 1.8  --   --     Cardiac Enzymes Recent Labs  Lab 06/27/18 1055  TROPONINI <0.03    Microbiology Results  Results for orders placed or performed during the hospital encounter of 06/27/18  Blood Culture (routine x 2)     Status: None (Preliminary result)   Collection Time: 06/27/18 12:45 PM  Result Value Ref Range Status   Specimen Description BLOOD LEFT ANTECUBITAL  Final   Special Requests   Final    BOTTLES DRAWN AEROBIC AND ANAEROBIC Blood Culture adequate volume   Culture   Final    NO GROWTH 4 DAYS Performed at [facility]  [address] [place], [place] [address]    Report Status PENDING  Incomplete  Blood Culture (routine x 2)     Status: None (Preliminary result)  Collection Time: 06/27/18 12:45 PM  Result Value Ref Range Status   Specimen Description BLOOD BLOOD RIGHT FOREARM  Final   Special Requests   Final    BOTTLES DRAWN AEROBIC AND ANAEROBIC Blood Culture results may not be optimal due to an excessive volume of blood received in culture bottles   Culture   Final    NO GROWTH 4 DAYS Performed at [facility], [address] [place], [place] [address]    Report Status PENDING  Incomplete  MRSA PCR Screening     Status: None   Collection Time: 06/28/18  6:14 AM  Result Value Ref Range Status   MRSA by PCR NEGATIVE NEGATIVE Final    Comment:        The GeneXpert MRSA Assay (FDA approved for NASAL specimens only), is one component of a comprehensive MRSA colonization surveillance program. It is not intended to diagnose MRSA infection nor to guide or monitor treatment for MRSA infections. Performed at [facility], [address] [place], [place] [address]     RADIOLOGY:  No results found.  EKG:   Orders placed or performed during the hospital encounter of 06/27/18  . ED EKG  . ED EKG      Management plans discussed with the patient, family and they are in agreement.  CODE STATUS:     Code Status Orders  (From admission, onward)         Start     Ordered   06/27/18 1550  Do not attempt resuscitation (DNR)  Continuous    Question Answer Comment  In the event of cardiac or respiratory ARREST Do not call a "code blue"   In the event of cardiac or respiratory ARREST Do not perform Intubation, CPR, defibrillation or ACLS   In the event of cardiac or respiratory ARREST Use medication by any route, position, wound care, and other measures to relive pain and suffering. May use oxygen, suction and manual treatment of airway obstruction as needed for  comfort.   Comments nurse may pronounce      06/27/18 1550        Code Status History    Date Active Date Inactive Code Status Order ID Comments User Context   06/16/2018 0219 06/19/2018 1843 DNR [id number]  Lance Coon, MD Inpatient   06/07/2018 1236 06/10/2018 1822 DNR [id number]  Loletha Grayer, MD ED   04/07/2018 1647 04/08/2018 2059 DNR [id number]  Loletha Grayer, MD ED    Advance Directive Documentation     Most Recent Value  Type of Advance Directive  Healthcare Power of Attorney, Living will  Pre-existing out of facility DNR order (yellow form or pink MOST form)  -  "MOST" Form in Place?  -      TOTAL TIME TAKING CARE OF THIS PATIENT: 35 minutes.    Vaughan Basta M.D on 07/01/2018 at 1:19 PM  Between 7am to 6pm - Pager - [phone]  After 6pm go to [email or web address] - password [email or web address] [facility]  Office  [phone]  CC: Primary care physician; [facility]   Note: This dictation was prepared with Dragon dictation along with smaller phrase technology. Any transcriptional errors that result from this process are unintentional.

## 2018-07-01 NOTE — Plan of Care (Signed)

## 2018-07-01 NOTE — Progress Notes (Signed)
[facility]  [phone] [phone]   Name: Philip Weiss Date: 07/01/2018 MRN: [id number]  DOB: 01/22/1946  Patient Care Team: [facility] [place] as PCP - General ([place]) Telford Nab, RN as Registered Nurse    REASON FOR CONSULTATION: Palliative Care consult requested for this 73 y.o. male with multiple medical problems including stage III lung cancer on treatment status post first round of chemoradiation in December 2019 and then restarted radiation in January 2020 but chemotherapy was held due to shortness of breath and anemia.  PMH also notable for hypertension and history of CVA.  He has several recent hospitalizations including 06/07/2018 to 06/10/2018 with SVT and was found to have multifocal pneumonia and possible radiation pneumonitis.  He was hospitalized again 06/16/2018 to 06/19/2018 again with acute respiratory failure and hypoxia secondary to pneumonia versus COPD exacerbation.  He is now readmitted 06/27/2018 with same.  Altogether, patient has been hospitalized 6 times in past 3 months.  Palliative care was consulted to help address goals.  CODE STATUS: DNR  PAST MEDICAL HISTORY: Past Medical History:  Diagnosis Date  . CVA (cerebral vascular accident) ([place])   . Hypertension   . Lung cancer ([place])     PAST SURGICAL HISTORY:  Past Surgical History:  Procedure Laterality Date  . CYST EXCISION Left    buttock  . ENDOBRONCHIAL ULTRASOUND Right 03/29/2018   Procedure: ENDOBRONCHIAL ULTRASOUND;  Surgeon: Tyler Pita, MD;  Location: [facility];  Service: Cardiopulmonary;  Laterality: Right;  . FLEXIBLE BRONCHOSCOPY Right 03/29/2018   Procedure: FLEXIBLE BRONCHOSCOPY;  Surgeon: Tyler Pita, MD;  Location: [facility];  Service: Cardiopulmonary;  Laterality: Right;  . PORTA CATH INSERTION N/A 04/13/2018   Procedure: PORTA CATH INSERTION;  Surgeon: Algernon Huxley, MD;  Location: [facility];  Service: Cardiovascular;  Laterality: N/A;    HEMATOLOGY/ONCOLOGY HISTORY:    Lung cancer ([place])   04/05/2018 Initial Diagnosis    Lung cancer ([place])    04/05/2018 Cancer Staging    Staging form: Lung, AJCC 8th Edition - Clinical stage from 04/05/2018: cT2, cN3, cM0 - Signed by Sindy Guadeloupe, MD on 04/05/2018    04/19/2018 -  Chemotherapy    The patient had palonosetron (ALOXI) injection 0.25 mg, 0.25 mg, Intravenous,  Once, 5 of 8 cycles Administration: 0.25 mg (04/27/2018), 0.25 mg (05/03/2018), 0.25 mg (04/19/2018), 0.25 mg (05/10/2018), 0.25 mg (05/17/2018) CARBOplatin (PARAPLATIN) 180 mg in sodium chloride 0.9 % 250 mL chemo infusion, 180 mg (100 % of original dose 180.6 mg), Intravenous,  Once, 5 of 8 cycles Dose modification:   (original dose 180.6 mg, Cycle 2) Administration: 180 mg (04/27/2018), 180 mg (05/03/2018), 180 mg (04/19/2018), 180 mg (05/10/2018), 180 mg (05/17/2018) PACLitaxel (TAXOL) 84 mg in sodium chloride 0.9 % 250 mL chemo infusion (</= 59m/m2), 45 mg/m2 = 84 mg, Intravenous,  Once, 5 of 8 cycles Administration: 84 mg (04/27/2018), 84 mg (05/03/2018), 84 mg (04/19/2018), 84 mg (05/10/2018), 84 mg (05/17/2018)  for chemotherapy treatment.      ALLERGIES:  is allergic to aspirin.  MEDICATIONS:  Current Facility-Administered Medications  Medication Dose Route Frequency Provider Last Rate Last Dose  . 0.9 %  sodium chloride infusion  250 mL Intravenous PRN CDemetrios Loll MD      . 0.9 %  sodium chloride infusion   Intravenous Continuous Salary, Montell D, MD 30 mL/hr at 06/30/18 1710    . acetaminophen (TYLENOL) tablet 650 mg  650 mg Oral Q6H PRN Demetrios Loll, MD       Or  . acetaminophen (TYLENOL) suppository 650 mg  650 mg Rectal Q6H PRN Demetrios Loll, MD      . albuterol (PROVENTIL) (2.5 MG/3ML) 0.083% nebulizer solution 2.5 mg  2.5 mg Nebulization Q2H PRN Demetrios Loll, MD      . aspirin EC tablet 81 mg  81 mg Oral Daily Demetrios Loll, MD   81 mg at  07/01/18 4193  . atorvastatin (LIPITOR) tablet 40 mg  40 mg Oral q1800 Demetrios Loll, MD   40 mg at 06/30/18 1733  . bisacodyl (DULCOLAX) EC tablet 5 mg  5 mg Oral Daily PRN Demetrios Loll, MD      . ceFEPIme (MAXIPIME) 2 g in sodium chloride 0.9 % 100 mL IVPB  2 g Intravenous Q12H Vaughan Basta, MD 200 mL/hr at 07/01/18 0102 2 g at 07/01/18 0102  . diltiazem (CARDIZEM CD) 24 hr capsule 300 mg  300 mg Oral Daily Vaughan Basta, MD   300 mg at 07/01/18 0900  . enoxaparin (LOVENOX) injection 40 mg  40 mg Subcutaneous Q24H Demetrios Loll, MD   40 mg at 06/30/18 2154  . ferrous sulfate tablet 325 mg  325 mg Oral BID WC Vaughan Basta, MD   325 mg at 07/01/18 0901  . guaiFENesin-dextromethorphan (ROBITUSSIN DM) 100-10 MG/5ML syrup 5 mL  5 mL Oral Q4H PRN Demetrios Loll, MD      . HYDROcodone-acetaminophen (NORCO/VICODIN) 5-325 MG per tablet 1-2 tablet  1-2 tablet Oral Q4H PRN Demetrios Loll, MD      . ipratropium-albuterol (DUONEB) 0.5-2.5 (3) MG/3ML nebulizer solution 3 mL  3 mL Nebulization TID Loney Hering D, MD   3 mL at 07/01/18 0740  . metoprolol tartrate (LOPRESSOR) tablet 12.5 mg  12.5 mg Oral BID Vaughan Basta, MD   12.5 mg at 07/01/18 0901  . mometasone-formoterol (DULERA) 200-5 MCG/ACT inhaler 2 puff  2 puff Inhalation BID Demetrios Loll, MD   2 puff at 07/01/18 0900  . ondansetron (ZOFRAN) tablet 4 mg  4 mg Oral Q6H PRN Demetrios Loll, MD       Or  . ondansetron [facility]) injection 4 mg  4 mg Intravenous Q6H PRN Demetrios Loll, MD      . predniSONE (DELTASONE) tablet 60 mg  60 mg Oral Q breakfast Vaughan Basta, MD   60 mg at 07/01/18 0900  . senna-docusate (Senokot-S) tablet 1 tablet  1 tablet Oral QHS PRN Demetrios Loll, MD      . sodium chloride flush (NS) 0.9 % injection 3 mL  3 mL Intravenous Q12H Demetrios Loll, MD   3 mL at 07/01/18 0905  . sodium chloride flush (NS) 0.9 % injection 3 mL  3 mL Intravenous PRN Demetrios Loll, MD      . vitamin C (ASCORBIC ACID) tablet 250 mg  250 mg  Oral BID Vaughan Basta, MD   250 mg at 07/01/18 0902   Facility-Administered Medications Ordered in Other Encounters  Medication Dose Route Frequency Provider Last Rate Last Dose  . sodium chloride flush (NS) 0.9 % injection 10 mL  10 mL Intracatheter PRN Sindy Guadeloupe, MD      . sodium chloride flush (NS) 0.9 % injection 10 mL  10 mL Intravenous PRN Sindy Guadeloupe, MD   10 mL at 06/14/18 0907    VITAL SIGNS: BP 130/75   Pulse (!) 101   Temp 98.1 F (36.7 C) (Oral)   Resp 20  Ht _0  (1.753 m)   Wt 151 lb 14.4 oz (68.9 kg)   SpO2 97%   BMI 22.43 kg/m  Filed Weights   06/27/18 1043  Weight: 151 lb 14.4 oz (68.9 kg)    Estimated body mass index is 22.43 kg/m as calculated from the following:   Height as of this encounter: _1  (1.753 m).   Weight as of this encounter: 151 lb 14.4 oz (68.9 kg).  LABS: CBC:    Component Value Date/Time   WBC 7.7 06/28/2018 0436   HGB 8.9 (L) 06/28/2018 0436   HGB 9.3 (L) 12/29/2013 1142   HCT 27.7 (L) 06/28/2018 0436   HCT 28.3 (L) 12/29/2013 1142   PLT 154 06/28/2018 0436   PLT 184 12/29/2013 1142   MCV 95.2 06/28/2018 0436   MCV 112 (H) 12/29/2013 1142   NEUTROABS 11.6 (H) 06/21/2018 0829   LYMPHSABS 0.6 (L) 06/21/2018 0829   MONOABS 1.1 (H) 06/21/2018 0829   EOSABS 0.0 06/21/2018 0829   BASOSABS 0.0 06/21/2018 0829   Comprehensive Metabolic Panel:    Component Value Date/Time   NA 134 (L) 06/28/2018 0436   NA 138 12/29/2013 1142   K 3.9 06/28/2018 0436   K 3.7 12/29/2013 1142   CL 104 06/28/2018 0436   CL 109 (H) 12/29/2013 1142   CO2 24 06/28/2018 0436   CO2 20 (L) 12/29/2013 1142   BUN 16 06/28/2018 0436   BUN 5 (L) 12/29/2013 1142   CREATININE 0.62 07/01/2018 0401   CREATININE 0.98 12/29/2013 1142   GLUCOSE 194 (H) 06/28/2018 0436   GLUCOSE 109 (H) 12/29/2013 1142   CALCIUM 8.1 (L) 06/28/2018 0436   CALCIUM 8.1 (L) 12/29/2013 1142   AST 53 (H) 06/21/2018 0829   ALT 82 (H) 06/21/2018 0829   ALKPHOS 85  06/21/2018 0829   BILITOT 0.5 06/21/2018 0829   PROT 6.3 (L) 06/21/2018 0829   ALBUMIN 2.8 (L) 06/21/2018 0829    RADIOGRAPHIC STUDIES: Dg Chest 2 View  Result Date: 06/27/2018 CLINICAL DATA:  Shortness of breath on exertion since yesterday afternoon. History of lung cancer. EXAM: CHEST - 2 VIEW COMPARISON:  06/15/2018 and CT 06/07/2018 FINDINGS: Right IJ Port-A-Cath unchanged. Lungs are adequately inflated demonstrate worsening patchy airspace process bilaterally most prominent over the right mid to lower lung likely ongoing infection. No significant effusion. Cardiomediastinal silhouette and remainder of the exam is unchanged. IMPRESSION: Interval worsening patchy bilateral airspace process most prominent over the right mid to lower lung likely multifocal pneumonia. Electronically Signed   By: Marin Olp M.D.   On: 06/27/2018 11:49   Ct Angio Chest Pe W Or Wo Contrast  Result Date: 06/07/2018 CLINICAL DATA:  73 year old male with history of adenocarcinoma of the lung. Shortness of breath. Supraventricular tachycardia. EXAM: CT ANGIOGRAPHY CHEST WITH CONTRAST TECHNIQUE: Multidetector CT imaging of the chest was performed using the standard protocol during bolus administration of intravenous contrast. Multiplanar CT image reconstructions and MIPs were obtained to evaluate the vascular anatomy. CONTRAST:  61m OMNIPAQUE IOHEXOL 350 MG/ML SOLN COMPARISON:  PET-CT 03/23/2018.  Chest CT 03/17/2018. FINDINGS: Cardiovascular: No filling defects within the pulmonary arterial tree to suggest underlying pulmonary embolism. Heart size is mildly enlarged. There is aortic atherosclerosis, as well as atherosclerosis of the great vessels of the mediastinum and the coronary arteries, including calcified atherosclerotic plaque in the left main and left anterior descending coronary arteries. Right internal jugular single-lumen porta cath with tip terminating in the distal superior vena cava. Mediastinum/Nodes:  Multiple prominent borderline enlarged and enlarged mediastinal and hilar lymph nodes, largest of which is a subcarinal lymph node measuring up to 2.1 cm in short axis. Esophagus is unremarkable in appearance. Esophagus is unremarkable in appearance. No axillary lymphadenopathy. Lungs/Pleura: Widespread but patchy areas of ground-glass attenuation and septal thickening scattered throughout the lungs bilaterally, significantly increased compared to the prior study from October 2019. Scattered areas of cylindrical and mild varicose bronchiectasis. Some thick-walled cystic changes are noted in the right upper lobe. Volume loss and scarring in the right middle lobe. Trace bilateral pleural effusions. Upper Abdomen: Unremarkable. Musculoskeletal: There are no aggressive appearing lytic or blastic lesions noted in the visualized portions of the skeleton. Review of the MIP images confirms the above findings. IMPRESSION: 1. No evidence of pulmonary embolism. 2. The appearance the chest suggests severe multilobar pneumonia. There also scattered areas of what appear to be post infectious or inflammatory scarring in the lungs bilaterally. 3. Aortic atherosclerosis, in addition to left main and left anterior descending coronary artery disease. Assessment for potential risk factor modification, dietary therapy or pharmacologic therapy may be warranted, if clinically indicated. Aortic Atherosclerosis (ICD10-I70.0). Electronically Signed   By: Vinnie Langton M.D.   On: 06/07/2018 19:20   Dg Chest Portable 1 View  Result Date: 06/15/2018 CLINICAL DATA:  Shortness of breath. EXAM: PORTABLE CHEST 1 VIEW COMPARISON:  Radiographs and CT 06/07/2018 FINDINGS: Right chest port in the mid SVC. Unchanged heart size and mediastinal contours. Mild chronic volume loss in the right lung. Diffuse interstitial opacities corresponding to ground-glass opacities on CT. Slight improvement in the left lung from prior exam. Small pleural effusions  on CT are not seen radiographically. No pneumothorax. Unchanged osseous structures. IMPRESSION: 1. Diffuse lung opacities with slight improvement in the left lung from prior exam. Unchanged appearance of the right hemithorax. CT findings suspicious for pneumonia. 2. No new abnormalities. Electronically Signed   By: Keith Rake M.D.   On: 06/15/2018 21:32   Dg Chest Portable 1 View  Result Date: 06/07/2018 CLINICAL DATA:  Two day history of dizziness and weakness. History of lung malignancy and hypertension. Discontinued smoking in October 2019 EXAM: PORTABLE CHEST 1 VIEW COMPARISON:  CT scan of the chest of March 17, 2018 and PET-CT study of March 23, 2018. FINDINGS: The lungs are well-expanded. The interstitial markings are coarse especially in the right infrahilar region. There is no alveolar infiltrate. The heart is top-normal in size. The pulmonary vascularity is not engorged. External pacemaker defibrillator pads are present. A porta catheter is present with the tip projecting over the distal third of the SVC. IMPRESSION: No focal pneumonia. Mild interstitial prominence bilaterally which is likely chronic. Top-normal cardiac size without pulmonary edema. Electronically Signed   By: David  Martinique M.D.   On: 06/07/2018 09:45    PERFORMANCE STATUS (ECOG) : 2 - Symptomatic, <50% confined to bed  Review of Systems As noted above. Otherwise, a complete review of systems is negative.  Physical Exam General: NAD, frail appearing, thin, sitting at edge of bed Cardiovascular: regular rate and rhythm Pulmonary: coarse ant fields, on O2 Abdomen: soft, nontender, + bowel sounds Extremities: no edema Skin: no rashes Neurological: Weakness but otherwise nonfocal  IMPRESSION: Patient says breathing is fine at rest but that he is still short of breath with exertion. He has not demonstrated significant changes with steroids yet. He is being recommended by pulmonary for an oral steroid taper at  time of discharge. Plan is for him to eventually  discharge home with home health. He has been working with PT.   Will plan to follow up with him in the clinic following discharge from the hospital.   PLAN: Continue current scope of treatment Will follow   Time Total: 15 minutes  Visit consisted of counseling and education dealing with the complex and emotionally intense issues of symptom management and palliative care in the setting of serious and potentially life-threatening illness.Greater than 50%  of this time was spent counseling and coordinating care related to the above assessment and plan.  Signed by: Altha Harm, PhD, NP-C [phone] (Work Cell)

## 2018-07-02 LAB — CULTURE, BLOOD (ROUTINE X 2)
Culture: NO GROWTH
Culture: NO GROWTH
Special Requests: ADEQUATE

## 2018-07-03 ENCOUNTER — Encounter: Payer: Self-pay | Admitting: Nurse Practitioner

## 2018-07-04 ENCOUNTER — Ambulatory Visit: Payer: Medicare PPO

## 2018-07-05 ENCOUNTER — Ambulatory Visit: Payer: Medicare PPO

## 2018-07-05 ENCOUNTER — Telehealth: Payer: Self-pay

## 2018-07-05 DIAGNOSIS — J181 Lobar pneumonia, unspecified organism: Secondary | ICD-10-CM

## 2018-07-05 NOTE — Telephone Encounter (Signed)
Called and spoke with patient's wife regarding hospital follow up apt. Scheduled for 08/02/18. Let her know we are ordering CT chest as well, and she will get a call about scheduling that. She is aware with no further questions at this time.

## 2018-07-06 ENCOUNTER — Telehealth: Payer: Self-pay

## 2018-07-06 ENCOUNTER — Ambulatory Visit: Payer: Medicare PPO

## 2018-07-06 NOTE — Telephone Encounter (Signed)
EMMI follow-up: Noted on the report that the patient didn't have a scheduled follow-up appointment and had some unfilled Rx's.  Patient wanted me to speak his wife and due to insurance coverage, his Rx will need to be filled after previous Rx used up on Feb.1.  Michela Pitcher they still need to schedule a date for CT scan.  She didn't think he had received his After Visit Summary so we reviewed his follow-up appointments.  I let her know he would receive a second automated call and to let us know if he had any concerns at that time. Thanked me for calling.

## 2018-07-07 ENCOUNTER — Ambulatory Visit: Payer: Medicare PPO

## 2018-07-07 ENCOUNTER — Telehealth: Payer: Self-pay | Admitting: *Deleted

## 2018-07-07 ENCOUNTER — Telehealth: Payer: Self-pay

## 2018-07-07 NOTE — Telephone Encounter (Signed)
The patient's wife called yesterday to ask whether or not when they come back for their appointment next week if she should put the cream over him for his port.  I told her that he is not supposed to get any treatment that day, however depending on how he is feeling he might need some fluids or want some fluids so I would go ahead and put the cream on him however when he gets here he will get his labs drawn through his arm and only after we see him depending on his labs or how he is feeling is the possibility of the fluids.  Wife agreeable to this.  She also states that his sats have been dropping when he gets up and moves around.  While he sitting on 2 L he is in the 90s she says, but when he gets up and moves around with Occupational Therapy or physical therapy that is there he starts dropping in percentage of saturation and they bumped him up to 3 L.  I spoke to Dr. Janese Banks and she says that it is okay for her to move him up to 3 L while he is up moving around but then when he comes back to sit or lying then she should return him  back to 2 L. Of oxygen.  Wife states that she is not sure how to make it go up or down.  She feels nervous to do it through trying and so I told her that wait till 1 of the therapists come out and when they start working with him and move the oxygen up- asked them to show her how to do it to she does have permission to move them up when he is walking around.  Wife agreeable with plan

## 2018-07-07 NOTE — Telephone Encounter (Signed)
EMMI Follow-up: Noted on the report that the patient was not able to get his Rx filled.  I talked with Mr. Philip Weiss and he said his wife handles his medications and she wasn't at home right now.  I explained I would be out of the office on Friday and provided him with Jennette Dubin number to follow-up.

## 2018-07-08 ENCOUNTER — Ambulatory Visit: Payer: Medicare PPO

## 2018-07-08 NOTE — Telephone Encounter (Signed)
Follow up from yesterday. Spoke with patient's wife concerning his prescription issue. Mrs. Demaria mentioned there was some issue with the pharmacy not being able to fill all of his prednisone all at once due to the pharmacy citing issues with the way it was written and how insurance would cover it. He was only able to fill a portion of it. She plans to check with the pharmacy after 2/1 to see if able to fill the rest.  Patient has a follow up appointment with his PCP on 2/7.  Encouraged her to contact his PCP if any issues obtaining the rest of the medication to see if they are able to assist. No other issues or concerns at this time.

## 2018-07-11 ENCOUNTER — Ambulatory Visit: Payer: Medicare PPO

## 2018-07-12 ENCOUNTER — Ambulatory Visit: Payer: Medicare PPO

## 2018-07-13 ENCOUNTER — Ambulatory Visit: Payer: Medicare PPO

## 2018-07-14 ENCOUNTER — Ambulatory Visit: Payer: Medicare PPO

## 2018-07-14 ENCOUNTER — Telehealth: Payer: Self-pay

## 2018-07-14 ENCOUNTER — Inpatient Hospital Stay (HOSPITAL_BASED_OUTPATIENT_CLINIC_OR_DEPARTMENT_OTHER): Payer: Medicare PPO | Admitting: Oncology

## 2018-07-14 ENCOUNTER — Inpatient Hospital Stay: Payer: Medicare PPO | Attending: Oncology

## 2018-07-14 VITALS — BP 127/77 | HR 112 | Temp 98.5°F | Wt 152.1 lb

## 2018-07-14 DIAGNOSIS — Z7952 Long term (current) use of systemic steroids: Secondary | ICD-10-CM | POA: Insufficient documentation

## 2018-07-14 DIAGNOSIS — Z79899 Other long term (current) drug therapy: Secondary | ICD-10-CM

## 2018-07-14 DIAGNOSIS — J189 Pneumonia, unspecified organism: Secondary | ICD-10-CM | POA: Diagnosis not present

## 2018-07-14 DIAGNOSIS — R531 Weakness: Secondary | ICD-10-CM

## 2018-07-14 DIAGNOSIS — Z923 Personal history of irradiation: Secondary | ICD-10-CM

## 2018-07-14 DIAGNOSIS — I10 Essential (primary) hypertension: Secondary | ICD-10-CM

## 2018-07-14 DIAGNOSIS — F17210 Nicotine dependence, cigarettes, uncomplicated: Secondary | ICD-10-CM | POA: Insufficient documentation

## 2018-07-14 DIAGNOSIS — R5383 Other fatigue: Secondary | ICD-10-CM | POA: Insufficient documentation

## 2018-07-14 DIAGNOSIS — R5381 Other malaise: Secondary | ICD-10-CM | POA: Diagnosis not present

## 2018-07-14 DIAGNOSIS — Z9221 Personal history of antineoplastic chemotherapy: Secondary | ICD-10-CM | POA: Diagnosis not present

## 2018-07-14 DIAGNOSIS — Z8673 Personal history of transient ischemic attack (TIA), and cerebral infarction without residual deficits: Secondary | ICD-10-CM | POA: Diagnosis not present

## 2018-07-14 DIAGNOSIS — C342 Malignant neoplasm of middle lobe, bronchus or lung: Secondary | ICD-10-CM | POA: Diagnosis not present

## 2018-07-14 DIAGNOSIS — Z7982 Long term (current) use of aspirin: Secondary | ICD-10-CM | POA: Diagnosis not present

## 2018-07-14 DIAGNOSIS — Z7189 Other specified counseling: Secondary | ICD-10-CM

## 2018-07-14 LAB — COMPREHENSIVE METABOLIC PANEL
ALT: 35 U/L (ref 0–44)
AST: 23 U/L (ref 15–41)
Albumin: 2.5 g/dL — ABNORMAL LOW (ref 3.5–5.0)
Alkaline Phosphatase: 90 U/L (ref 38–126)
Anion gap: 5 (ref 5–15)
BILIRUBIN TOTAL: 0.5 mg/dL (ref 0.3–1.2)
BUN: 12 mg/dL (ref 8–23)
CO2: 29 mmol/L (ref 22–32)
Calcium: 8.5 mg/dL — ABNORMAL LOW (ref 8.9–10.3)
Chloride: 101 mmol/L (ref 98–111)
Creatinine, Ser: 0.68 mg/dL (ref 0.61–1.24)
GFR calc Af Amer: >60 mL/min (ref >60)
GFR calc non Af Amer: >60 mL/min (ref >60)
Glucose, Bld: 163 mg/dL — ABNORMAL HIGH (ref 70–99)
Potassium: 3.2 mmol/L — ABNORMAL LOW (ref 3.5–5.1)
Sodium: 135 mmol/L (ref 135–145)
Total Protein: 6.1 g/dL — ABNORMAL LOW (ref 6.5–8.1)

## 2018-07-14 LAB — CBC WITH DIFFERENTIAL/PLATELET
ABS IMMATURE GRANULOCYTES: 0.07 10*3/uL (ref 0.00–0.07)
BASOS PCT: 0 %
Basophils Absolute: 0 10*3/uL (ref 0.0–0.1)
Eosinophils Absolute: 0 10*3/uL (ref 0.0–0.5)
Eosinophils Relative: 0 %
HCT: 30.2 % — ABNORMAL LOW (ref 39.0–52.0)
Hemoglobin: 10 g/dL — ABNORMAL LOW (ref 13.0–17.0)
Immature Granulocytes: 1 %
Lymphocytes Relative: 5 %
Lymphs Abs: 0.6 10*3/uL — ABNORMAL LOW (ref 0.7–4.0)
MCH: 31.1 pg (ref 26.0–34.0)
MCHC: 33.1 g/dL (ref 30.0–36.0)
MCV: 93.8 fL (ref 80.0–100.0)
MONOS PCT: 5 %
Monocytes Absolute: 0.5 10*3/uL (ref 0.1–1.0)
Neutro Abs: 9.5 10*3/uL — ABNORMAL HIGH (ref 1.7–7.7)
Neutrophils Relative %: 89 %
Platelets: 97 10*3/uL — ABNORMAL LOW (ref 150–400)
RBC: 3.22 MIL/uL — ABNORMAL LOW (ref 4.22–5.81)
RDW: 18.4 % — ABNORMAL HIGH (ref 11.5–15.5)
WBC: 10.7 10*3/uL — ABNORMAL HIGH (ref 4.0–10.5)
nRBC: 0 % (ref 0.0–0.2)

## 2018-07-14 MED ORDER — POTASSIUM CHLORIDE CRYS ER 20 MEQ PO TBCR: 20.0000 meq | EXTENDED_RELEASE_TABLET | Freq: Every day | ORAL | 0 refills | Status: DC

## 2018-07-14 NOTE — Progress Notes (Signed)
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 He underwent lung cancer screening low-dose CT scan which showed a right middle lobe lung mass of about 4.8 cm. Subcarinal adenopathy 2.8 x 4 cm and probable right hilar adenopathy and soft tissue fullness 2.3 cm. This was followed by a PET CT scan which showed right middle lobe mass was hypermetabolic at 03.2 with hypermetabolic right hilar and infrahilar adenopathy. Hypermetabolic 2.5 cm subcarinal node and right paratracheal nodes. No hypermetabolic contralateral adenopathy. Left upper lobe 4 mm solitary pulmonary nodule below PET resolution. No evidence of hypermetabolic them elsewhere.  Biopsy of the right middle lobe showed non-small cell carcinoma favor adenocarcinoma. 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 However it showed acute cerebellar stroke for which patient was admitted to the hospital and underwent a work-up including carotid Dopplers as well as CTA of the head and neck and echo with bubble study which were unremarkable  Patient received 4 weeks of concurrent carbotaxol chemotherapy and radiation which she tolerated relatively well. He then had a break of 4 weeks as he was hospitalized twice in the interim for worsening shortness of breath and was  found to have multi lobar pneumonia treated with antibiotics.  Case discussed at tumor board and patient has concerning findings of pneumonitis which will be Taxol versus radiation-induced.  He was started on high-dose steroids followed by a steroid taper   Interval history-he continues to feel weak and feels short of breath on minimal exertion even when he walks to the bathroom.  He is currently on 3 L of oxygen  ECOG PS- 3 Pain scale- 0 Opioid associated constipation- no  Review of systems- Review of Systems  Constitutional: Positive for malaise/fatigue. Negative for chills, fever and weight loss.  HENT: Negative for congestion, ear discharge and nosebleeds.   Eyes: Negative for blurred vision.  Respiratory: Positive for shortness of breath. Negative for cough, hemoptysis, sputum production and wheezing.   Cardiovascular: Negative for chest pain, palpitations, orthopnea and claudication.  Gastrointestinal: Negative for abdominal pain, blood in stool, constipation, diarrhea, heartburn, melena, nausea and vomiting.  Genitourinary: Negative for dysuria, flank pain, frequency, hematuria and urgency.  Musculoskeletal: Negative for back pain, joint pain and myalgias.  Skin: Negative for rash.  Neurological: Negative for dizziness, tingling, focal weakness, seizures, weakness and headaches.  Endo/Heme/Allergies: Does not bruise/bleed easily.  Psychiatric/Behavioral: Negative for depression and suicidal ideas. The patient does not have insomnia.       Allergies  Allergen Reactions  . Aspirin Itching    Patient states may not be allergic, he was taking too many aspirin and became itchy. As long as he takes one tablet daily, he is fine     Past Medical History:  Diagnosis Date  . CVA (cerebral vascular accident) ([place])   . Hypertension   . Lung cancer ([place])  Past Surgical History:  Procedure Laterality Date  . CYST EXCISION Left    buttock  . ENDOBRONCHIAL ULTRASOUND Right  03/29/2018   Procedure: ENDOBRONCHIAL ULTRASOUND;  Surgeon: Tyler Pita, MD;  Location: [facility] ORS;  Service: Cardiopulmonary;  Laterality: Right;  . FLEXIBLE BRONCHOSCOPY Right 03/29/2018   Procedure: FLEXIBLE BRONCHOSCOPY;  Surgeon: Tyler Pita, MD;  Location: [facility];  Service: Cardiopulmonary;  Laterality: Right;  . PORTA CATH INSERTION N/A 04/13/2018   Procedure: PORTA CATH INSERTION;  Surgeon: Algernon Huxley, MD;  Location: [place] [facility];  Service: Cardiovascular;  Laterality: N/A;    Social History   Socioeconomic History  . Marital status: Married    Spouse name: Not on file  . Number of children: 4  . Years of education: Not on file  . Highest education level: Not on file  Occupational History  . Occupation: retired    Comment: mill/construction   Social Needs  . Financial resource strain: Not hard at all  . Food insecurity:    Worry: Never true    Inability: Never true  . Transportation needs:    Medical: No    Non-medical: No  Tobacco Use  . Smoking status: Former Smoker    Packs/day: 1.00    Years: 58.00    Pack years: 58.00    Types: Cigarettes    Last attempt to quit: 03/30/2018    Years since quitting: 0.2  . Smokeless tobacco: Never Used  Substance and Sexual Activity  . Alcohol use: Not Currently    Comment: daily 32 oz beer  . Drug use: Not Currently  . Sexual activity: Not Currently  Lifestyle  . Physical activity:    Days per week: 0 days    Minutes per session: 0 min  . Stress: Only a little  Relationships  . Social connections:    Talks on phone: More than three times a week    Gets together: Twice a week    Attends religious service: More than 4 times per year    Active member of club or organization: Not on file    Attends meetings of clubs or organizations: Not on file    Relationship status: Married  . Intimate partner violence:    Fear of current or ex partner: No    Emotionally abused: No    Physically abused: No     Forced sexual activity: No  Other Topics Concern  . Not on file  Social History Narrative   Lives with wife at home    Family History  Problem Relation Age of Onset  . Diabetes Mother   . CAD Mother   . Stroke Father      Current Outpatient Medications:  .  albuterol (PROVENTIL HFA;VENTOLIN HFA) 108 (90 Base) MCG/ACT inhaler, Inhale 2 puffs into the lungs every 6 (six) hours as needed for wheezing or shortness of breath., Disp: 1 Inhaler, Rfl: 2 .  aspirin EC 81 MG EC tablet, Take 1 tablet (81 mg total) by mouth daily., Disp: 30 tablet, Rfl: 1 .  atorvastatin (LIPITOR) 40 MG tablet, Take 1 tablet (40 mg total) by mouth daily at 6 PM., Disp: 30 tablet, Rfl: 1 .  budesonide-formoterol (SYMBICORT) 160-4.5 MCG/ACT inhaler, Inhale 2 puffs into the lungs 2 (two) times daily., Disp: , Rfl:  .  Cyanocobalamin (VITAMIN B-12 PO), Take 1 tablet by mouth daily., Disp: , Rfl:  .  diltiazem (CARDIZEM CD) 300 MG 24 hr capsule, Take 1 capsule (300 mg  total) by mouth daily., Disp: 30 capsule, Rfl: 0 .  ferrous sulfate 325 (65 FE) MG tablet, Take 1 tablet (325 mg total) by mouth 2 (two) times daily with a meal., Disp: 60 tablet, Rfl: 3 .  guaiFENesin-dextromethorphan (ROBITUSSIN DM) 100-10 MG/5ML syrup, Take 5 mLs by mouth every 4 (four) hours as needed for cough., Disp: 118 mL, Rfl: 0 .  levalbuterol (XOPENEX) 1.25 MG/0.5ML nebulizer solution, Take 1.25 mg by nebulization every 6 (six) hours as needed for wheezing or shortness of breath., Disp: 30 each, Rfl: 1 .  metoprolol tartrate (LOPRESSOR) 25 MG tablet, Take 0.5 tablets (12.5 mg total) by mouth 2 (two) times daily., Disp: 60 tablet, Rfl: 0 .  Multiple Vitamins-Minerals (MULTIVITAMIN PO), Take 1 tablet by mouth daily., Disp: , Rfl:  .  [START ON 07/23/2018] predniSONE (DELTASONE) 10 MG tablet, Take 3 tablets (30 mg total) by mouth daily for 7 days., Disp: 30 tablet, Rfl: 0 .  [START ON 08/06/2018] predniSONE (DELTASONE) 10 MG tablet, Take 1 tablet (10  mg total) by mouth daily for 7 days., Disp: 7 tablet, Rfl: 0 .  [START ON 07/16/2018] predniSONE (DELTASONE) 20 MG tablet, Take 2 tablets (40 mg total) by mouth daily for 7 days., Disp: 14 tablet, Rfl: 0 .  [START ON 07/30/2018] predniSONE (DELTASONE) 20 MG tablet, Take 1 tablet (20 mg total) by mouth daily for 7 days., Disp: 7 tablet, Rfl: 0 .  predniSONE (DELTASONE) 50 MG tablet, Take 1 tablet (50 mg total) by mouth daily with breakfast for 6 days., Disp: 7 tablet, Rfl: 0 .  Pyridoxine HCl (VITAMIN B-6 PO), Take 1 tablet by mouth daily., Disp: , Rfl:  .  vitamin C (VITAMIN C) 250 MG tablet, Take 1 tablet (250 mg total) by mouth 2 (two) times daily., Disp: 60 tablet, Rfl: 0 .  potassium chloride SA (K-DUR,KLOR-CON) 20 MEQ tablet, Take 1 tablet (20 mEq total) by mouth daily., Disp: 10 tablet, Rfl: 0 No current facility-administered medications for this visit.   Facility-Administered Medications Ordered in Other Visits:  .  sodium chloride flush (NS) 0.9 % injection 10 mL, 10 mL, Intracatheter, PRN, Sindy Guadeloupe, MD .  sodium chloride flush (NS) 0.9 % injection 10 mL, 10 mL, Intravenous, PRN, Sindy Guadeloupe, MD, 10 mL at 06/14/18 0907  Physical exam:  Vitals:   07/14/18 0902  BP: 127/77  Pulse: (!) 112  Temp: 98.5 F (36.9 C)  TempSrc: Oral  Weight: 152 lb 1.6 oz (69 kg)   Physical Exam Constitutional:      Comments: Appears fatigued.  He is sitting in a wheelchair and is on home oxygen  HENT:     Head: Normocephalic and atraumatic.  Eyes:     Pupils: Pupils are equal, round, and reactive to light.  Neck:     Musculoskeletal: Normal range of motion.  Cardiovascular:     Rate and Rhythm: Normal rate and regular rhythm.     Heart sounds: Normal heart sounds.  Pulmonary:     Effort: Pulmonary effort is normal.     Breath sounds: Normal breath sounds.  Abdominal:     General: Bowel sounds are normal.     Palpations: Abdomen is soft.  Skin:    General: Skin is warm and dry.    Neurological:     Mental Status: He is alert and oriented to person, place, and time.      CMP Latest Ref Rng & Units 07/14/2018  Glucose 70 - 99 mg/dL  163(H)  BUN 8 - 23 mg/dL 12  Creatinine 0.61 - 1.24 mg/dL 0.68  Sodium 135 - 145 mmol/L 135  Potassium 3.5 - 5.1 mmol/L 3.2(L)  Chloride 98 - 111 mmol/L 101  CO2 22 - 32 mmol/L 29  Calcium 8.9 - 10.3 mg/dL 8.5(L)  Total Protein 6.5 - 8.1 g/dL 6.1(L)  Total Bilirubin 0.3 - 1.2 mg/dL 0.5  Alkaline Phos 38 - 126 U/L 90  AST 15 - 41 U/L 23  ALT 0 - 44 U/L 35   CBC Latest Ref Rng & Units 07/14/2018  WBC 4.0 - 10.5 K/uL 10.7(H)  Hemoglobin 13.0 - 17.0 g/dL 10.0(L)  Hematocrit 39.0 - 52.0 % 30.2(L)  Platelets 150 - 400 K/uL 97(L)    No images are attached to the encounter.  Dg Chest 2 View  Result Date: 06/27/2018 CLINICAL DATA:  Shortness of breath on exertion since yesterday afternoon. History of lung cancer. EXAM: CHEST - 2 VIEW COMPARISON:  06/15/2018 and CT 06/07/2018 FINDINGS: Right IJ Port-A-Cath unchanged. Lungs are adequately inflated demonstrate worsening patchy airspace process bilaterally most prominent over the right mid to lower lung likely ongoing infection. No significant effusion. Cardiomediastinal silhouette and remainder of the exam is unchanged. IMPRESSION: Interval worsening patchy bilateral airspace process most prominent over the right mid to lower lung likely multifocal pneumonia. Electronically Signed   By: Marin Olp M.D.   On: 06/27/2018 11:49   Dg Chest Portable 1 View  Result Date: 06/15/2018 CLINICAL DATA:  Shortness of breath. EXAM: PORTABLE CHEST 1 VIEW COMPARISON:  Radiographs and CT 06/07/2018 FINDINGS: Right chest port in the mid SVC. Unchanged heart size and mediastinal contours. Mild chronic volume loss in the right lung. Diffuse interstitial opacities corresponding to ground-glass opacities on CT. Slight improvement in the left lung from prior exam. Small pleural effusions on CT are not seen  radiographically. No pneumothorax. Unchanged osseous structures. IMPRESSION: 1. Diffuse lung opacities with slight improvement in the left lung from prior exam. Unchanged appearance of the right hemithorax. CT findings suspicious for pneumonia. 2. No new abnormalities. Electronically Signed   By: Keith Rake M.D.   On: 06/15/2018 21:32     Assessment and plan- Patient is a 73 y.o. male withadenocarcinoma of the right middle lobe of the lung stage IIIBcT2 N3M0. he is here for routine follow up of chronic hypoxic respiratory failure likely due to pneumonitis  Despite steroids and antibioticss, patient does not feel better symptomatically. He remains on 3L O2. His acute on chronic hypoxic respiratory failure has been attributed to He will be completing his steroid taper over 2 weeks. He has a folllow up with Dr. Patsey Berthold on 2/25.  I will see him back in a month to assess hoe he is doing. He is not a candidate for further chemotherapy or immunotherapy at this time. His prognosis from combination of stage 3 lung cancer and ongoing pneumonitis is poor. I will have him seen by palliative care at this next visit   Visit Diagnosis 1. Malignant neoplasm of middle lobe of right lung [facility])      Dr. Randa Evens, MD, MPH [facility] [phone] 07/14/2018 11:43 AM

## 2018-07-14 NOTE — Telephone Encounter (Signed)
LM for Sherri at cancer center that Dr. Patsey Berthold didn't want CT until after his apt on 08/02/18.

## 2018-07-15 ENCOUNTER — Encounter: Payer: Self-pay | Admitting: Oncology

## 2018-07-27 ENCOUNTER — Encounter: Payer: Self-pay | Admitting: Hospice and Palliative Medicine

## 2018-07-27 ENCOUNTER — Inpatient Hospital Stay (HOSPITAL_BASED_OUTPATIENT_CLINIC_OR_DEPARTMENT_OTHER): Payer: Medicare PPO | Admitting: Hospice and Palliative Medicine

## 2018-07-27 ENCOUNTER — Telehealth: Payer: Self-pay | Admitting: *Deleted

## 2018-07-27 ENCOUNTER — Inpatient Hospital Stay: Payer: Medicare PPO | Admitting: Oncology

## 2018-07-27 VITALS — BP 121/82 | HR 79 | Temp 98.0°F | Resp 20 | Ht 69.0 in | Wt 147.7 lb

## 2018-07-27 DIAGNOSIS — Z923 Personal history of irradiation: Secondary | ICD-10-CM | POA: Diagnosis not present

## 2018-07-27 DIAGNOSIS — C342 Malignant neoplasm of middle lobe, bronchus or lung: Secondary | ICD-10-CM | POA: Diagnosis not present

## 2018-07-27 DIAGNOSIS — R5381 Other malaise: Secondary | ICD-10-CM

## 2018-07-27 DIAGNOSIS — R0600 Dyspnea, unspecified: Secondary | ICD-10-CM

## 2018-07-27 DIAGNOSIS — Z515 Encounter for palliative care: Secondary | ICD-10-CM

## 2018-07-27 DIAGNOSIS — Z7982 Long term (current) use of aspirin: Secondary | ICD-10-CM

## 2018-07-27 DIAGNOSIS — R531 Weakness: Secondary | ICD-10-CM

## 2018-07-27 DIAGNOSIS — Z7189 Other specified counseling: Secondary | ICD-10-CM

## 2018-07-27 DIAGNOSIS — R5383 Other fatigue: Secondary | ICD-10-CM

## 2018-07-27 DIAGNOSIS — Z9221 Personal history of antineoplastic chemotherapy: Secondary | ICD-10-CM

## 2018-07-27 DIAGNOSIS — F17210 Nicotine dependence, cigarettes, uncomplicated: Secondary | ICD-10-CM

## 2018-07-27 DIAGNOSIS — Z79899 Other long term (current) drug therapy: Secondary | ICD-10-CM

## 2018-07-27 MED ORDER — LORAZEPAM 0.5 MG PO TABS: 0.5000 mg | ORAL_TABLET | Freq: Three times a day (TID) | ORAL | 0 refills | Status: AC

## 2018-07-27 MED ORDER — MORPHINE SULFATE (CONCENTRATE) 10 MG /0.5 ML PO SOLN: 5.0000 mg | ORAL | 0 refills | Status: AC | PRN

## 2018-07-27 NOTE — Telephone Encounter (Signed)
Family was also told to take him to the ER if his symptoms worsened.Philip Weiss

## 2018-07-27 NOTE — Progress Notes (Signed)
[facility]  [phone] [phone]   Name: NANCY MANUELE Date: 07/27/2018 MRN: [id number]  DOB: April 21, 1946  Patient Care Team: [facility] as PCP - General ([place]) Telford Nab, RN as Registered Nurse    REASON FOR CONSULTATION: Palliative Care consult requested for this 73 y.o. male with multiple medical problems including stage III adenocarcinoma the right lung status post concurrent chemotherapy with Taxol and radiation.  PMH also notable for history of CVA and O2 dependent COPD.  Patient's treatment course has been complicated by several recent hospitalizations including 06/07/2018 06/10/2018 with multifocal pneumonia and possible radiation pneumonitis.  Patient was hospitalized again 06/16/2018 to 06/19/2018 with acute on chronic respiratory failure secondary to HCAP.  Patient was then readmitted 06/27/2018 to 07/01/2018 again with acute on chronic respiratory failure secondary to HCAP versus radiation pneumonitis.  Patient was seen in consultation by pulmonary while hospitalized.  He was started on high-dose steroids and antibiotics but did not seem to have any significant clinical improvement.  There was concern that symptoms might reflect lymphangitic spread of the cancer.  Patient was pending outpatient referral to pulmonary.  He was referred to palliative care to help address symptoms and goals.   SOCIAL HISTORY:     reports that he quit smoking about 3 months ago. His smoking use included cigarettes. He has a 58.00 pack-year smoking history. He has never used smokeless tobacco. He reports previous alcohol use. He reports previous drug use.   Patient lives at home with his wife.  He has 4 children but only a daughter lives nearby.  Patient used to work in [employer] and then later in [employer].  ADVANCE DIRECTIVES:  Does not have  CODE STATUS: DNR  PAST MEDICAL HISTORY: Past Medical  History:  Diagnosis Date  . CVA (cerebral vascular accident) ([place])   . Hypertension   . Lung cancer ([place])     PAST SURGICAL HISTORY:  Past Surgical History:  Procedure Laterality Date  . CYST EXCISION Left    buttock  . ENDOBRONCHIAL ULTRASOUND Right 03/29/2018   Procedure: ENDOBRONCHIAL ULTRASOUND;  Surgeon: Tyler Pita, MD;  Location: [facility] ORS;  Service: Cardiopulmonary;  Laterality: Right;  . FLEXIBLE BRONCHOSCOPY Right 03/29/2018   Procedure: FLEXIBLE BRONCHOSCOPY;  Surgeon: Tyler Pita, MD;  Location: [facility] OR[facility];  Service: Cardiopulmonary;  Laterality: Right;  . PORTA CATH INSERTION N/A 04/13/2018   Procedure: PORTA CATH INSERTION;  Surgeon: Algernon Huxley, MD;  Location: [facility];  Service: Cardiovascular;  Laterality: N/A;    HEMATOLOGY/ONCOLOGY HISTORY:    Lung cancer ([place])   04/05/2018 Initial Diagnosis    Lung cancer ([place])    04/05/2018 Cancer Staging    Staging form: Lung, AJCC 8th Edition - Clinical stage from 04/05/2018: cT2, cN3, cM0 - Signed by Sindy Guadeloupe, MD on 04/05/2018    04/19/2018 -  Chemotherapy    The patient had palonosetron (ALOXI) injection 0.25 mg, 0.25 mg, Intravenous,  Once, 5 of 8 cycles Administration: 0.25 mg (04/27/2018), 0.25 mg (05/03/2018), 0.25 mg (04/19/2018), 0.25 mg (05/10/2018), 0.25 mg (05/17/2018) CARBOplatin (PARAPLATIN) 180 mg in sodium chloride 0.9 % 250 mL chemo infusion, 180 mg (100 % of original dose 180.6 mg), Intravenous,  Once, 5 of 8 cycles Dose modification:   (original dose 180.6 mg, Cycle 2) Administration: 180 mg (04/27/2018), 180 mg (05/03/2018), 180 mg (04/19/2018), 180 mg (05/10/2018), 180 mg (05/17/2018) PACLitaxel (TAXOL) 84 mg in sodium chloride 0.9 %  250 mL chemo infusion (</= 72m/m2), 45 mg/m2 = 84 mg, Intravenous,  Once, 5 of 8 cycles Administration: 84 mg (04/27/2018), 84 mg (05/03/2018), 84 mg (04/19/2018), 84 mg (05/10/2018), 84 mg (05/17/2018)  for chemotherapy treatment.       ALLERGIES:  is allergic to aspirin.  MEDICATIONS:  Current Outpatient Medications  Medication Sig Dispense Refill  . albuterol (PROVENTIL HFA;VENTOLIN HFA) 108 (90 Base) MCG/ACT inhaler Inhale 2 puffs into the lungs every 6 (six) hours as needed for wheezing or shortness of breath. 1 Inhaler 2  . aspirin EC 81 MG EC tablet Take 1 tablet (81 mg total) by mouth daily. 30 tablet 1  . atorvastatin (LIPITOR) 40 MG tablet Take 1 tablet (40 mg total) by mouth daily at 6 PM. 30 tablet 1  . budesonide-formoterol (SYMBICORT) 160-4.5 MCG/ACT inhaler Inhale 2 puffs into the lungs 2 (two) times daily.    . Cyanocobalamin (VITAMIN B-12 PO) Take 1 tablet by mouth daily.    .Marland Kitchendiltiazem (CARDIZEM CD) 300 MG 24 hr capsule Take 1 capsule (300 mg total) by mouth daily. 30 capsule 0  . ferrous sulfate 325 (65 FE) MG tablet Take 1 tablet (325 mg total) by mouth 2 (two) times daily with a meal. 60 tablet 3  . levalbuterol (XOPENEX) 1.25 MG/0.5ML nebulizer solution Take 1.25 mg by nebulization every 6 (six) hours as needed for wheezing or shortness of breath. 30 each 1  . metoprolol tartrate (LOPRESSOR) 25 MG tablet Take 0.5 tablets (12.5 mg total) by mouth 2 (two) times daily. 60 tablet 0  . Multiple Vitamins-Minerals (MULTIVITAMIN PO) Take 1 tablet by mouth daily.    . predniSONE (DELTASONE) 10 MG tablet Take 3 tablets (30 mg total) by mouth daily for 7 days. 30 tablet 0  . Pyridoxine HCl (VITAMIN B-6 PO) Take 1 tablet by mouth daily.    . vitamin C (VITAMIN C) 250 MG tablet Take 1 tablet (250 mg total) by mouth 2 (two) times daily. 60 tablet 0  . guaiFENesin-dextromethorphan (ROBITUSSIN DM) 100-10 MG/5ML syrup Take 5 mLs by mouth every 4 (four) hours as needed for cough. (Patient not taking: Reported on 07/27/2018) 118 mL 0  . [START ON 08/06/2018] predniSONE (DELTASONE) 10 MG tablet Take 1 tablet (10 mg total) by mouth daily for 7 days. (Patient not taking: Reported on 07/27/2018) 7 tablet 0  . [START ON  07/30/2018] predniSONE (DELTASONE) 20 MG tablet Take 1 tablet (20 mg total) by mouth daily for 7 days. (Patient not taking: Reported on 07/27/2018) 7 tablet 0   No current facility-administered medications for this visit.    Facility-Administered Medications Ordered in Other Visits  Medication Dose Route Frequency Provider Last Rate Last Dose  . sodium chloride flush (NS) 0.9 % injection 10 mL  10 mL Intracatheter PRN RSindy Guadeloupe MD      . sodium chloride flush (NS) 0.9 % injection 10 mL  10 mL Intravenous PRN RSindy Guadeloupe MD   10 mL at 06/14/18 0907    VITAL SIGNS: There were no vitals taken for this visit. There were no vitals filed for this visit.  Estimated body mass index is 22.46 kg/m as calculated from the following:   Height as of 06/27/18: _0  (1.753 m).   Weight as of 07/14/18: 152 lb 1.6 oz (69 kg).  LABS: CBC:    Component Value Date/Time   WBC 10.7 (H) 07/14/2018 0832   HGB 10.0 (L) 07/14/2018 0832   HGB 9.3 (L) 12/29/2013  1142   HCT 30.2 (L) 07/14/2018 0832   HCT 28.3 (L) 12/29/2013 1142   PLT 97 (L) 07/14/2018 0832   PLT 184 12/29/2013 1142   MCV 93.8 07/14/2018 0832   MCV 112 (H) 12/29/2013 1142   NEUTROABS 9.5 (H) 07/14/2018 0832   LYMPHSABS 0.6 (L) 07/14/2018 0832   MONOABS 0.5 07/14/2018 0832   EOSABS 0.0 07/14/2018 0832   BASOSABS 0.0 07/14/2018 0832   Comprehensive Metabolic Panel:    Component Value Date/Time   NA 135 07/14/2018 0832   NA 138 12/29/2013 1142   K 3.2 (L) 07/14/2018 0832   K 3.7 12/29/2013 1142   CL 101 07/14/2018 0832   CL 109 (H) 12/29/2013 1142   CO2 29 07/14/2018 0832   CO2 20 (L) 12/29/2013 1142   BUN 12 07/14/2018 0832   BUN 5 (L) 12/29/2013 1142   CREATININE 0.68 07/14/2018 0832   CREATININE 0.98 12/29/2013 1142   GLUCOSE 163 (H) 07/14/2018 0832   GLUCOSE 109 (H) 12/29/2013 1142   CALCIUM 8.5 (L) 07/14/2018 0832   CALCIUM 8.1 (L) 12/29/2013 1142   AST 23 07/14/2018 0832   ALT 35 07/14/2018 0832   ALKPHOS 90  07/14/2018 0832   BILITOT 0.5 07/14/2018 0832   PROT 6.1 (L) 07/14/2018 0832   ALBUMIN 2.5 (L) 07/14/2018 5409    RADIOGRAPHIC STUDIES: No results found.  PERFORMANCE STATUS (ECOG) : 3 - Symptomatic, >50% confined to bed  Review of Systems As noted above. Otherwise, a complete review of systems is negative.  Physical Exam General: NAD, frail appearing, thin Cardiovascular: tachy Pulmonary: Coarse anterior and posterior fields Abdomen: soft, nontender, + bowel sounds GU: no suprapubic tenderness Extremities: Trace edema bilateral lower extremities Skin: no rashes Neurological: Weakness but otherwise nonfocal  IMPRESSION: I met with patient, wife, and daughter today in clinic.  Patient is known to me from his recent hospitalization.  Patient says that he never returned to his baseline following his recent hospitalization.  Each hospitalization has been associated with further decline.  Patient has been working with physical therapy but has been markedly limited with how much he can ambulate due to dyspnea.  Patient says that his dyspnea has been worse over the past few days.  He is unable to ambulate more than a couple of feet without having to sit down due to shortness of breath.  Patient denies fever or chills.  He has a chronic nonproductive cough. No increase in LE edema.   Patient has been treated several times in the past for multifocal pneumonia.  He is not currently on antibiotics.  He is still taking prednisone 30 mg daily (steroid taper) but cannot tell any difference in his symptoms from when his steroid dose was higher.  He does use nebulizers at home.  We had a lengthy conversation regarding his clinical status.  Work-up and treatment would likely be best suited in the inpatient setting.  However, patient verbalized a desire not to be hospitalized.  Ultimately, hospitalization seems unlikely to alter his progressive decline.  Alternatively, we discussed the option of hospice  at home.  Both patient and family verbalized a desire to go home with hospice care and focus on comfort and quality of life.  Patient says he recognizes that he is likely approaching end-of-life.  Symptomatically, patient has severe dyspnea with minimal exertion.  He describes a pattern of anxiety associated with his dyspnea.  Will give trial of morphine elixir and p.o. Lorazepam.  Also discussed energy conservation techniques.  I recommended use of cool air attempts and bedside fan.  He also would likely benefit from DME in the home including wheelchair, bedside commode, and hospital bed.  We discussed CODE STATUS.  Patient says that he would not want to be resuscitated nor have his life prolonged artificially on machines.  He does not want to be intubated.  He agreed with DNR and family say they will support that decision.  I completed a out of facility DNR form for patient to take home.  Verbal order for hospice services called in to [facility]. Case and plan discussed with Dr. Janese Banks.   PLAN: 1.  Best supportive care 2.  Referral to hospice at home 3.  Start morphine elixir (20 mg/mL) 5 to 10 mg (0.25 mL to 0.5 mL) every 2 hours as needed for pain or shortness of breath (Rx #43m) 4.  Lorazepam 0.5 mg p.o. every 8 hours as needed for anxiety 5.  Continue nebulizers 6.  Prophylactic bowel regimen with PRN Senokot 7.  Recommend cool air temps, bedside fan, and energy conservation techniques for management of dyspnea 8.  DNR 9.  RTC as needed   Patient expressed understanding and was in agreement with this plan. He also understands that He can call clinic at any time with any questions, concerns, or complaints.     Time Total: 60 minutes  Visit consisted of counseling and education dealing with the complex and emotionally intense issues of symptom management and palliative care in the setting of serious and potentially life-threatening illness.Greater than 50%  of this time was spent  counseling and coordinating care related to the above assessment and plan.  Signed by: JAltha Harm PhD, NP-C [phone](Work Cell)

## 2018-07-27 NOTE — Telephone Encounter (Signed)
Ok thanks 

## 2018-07-27 NOTE — Telephone Encounter (Signed)
Patient came in today to be evaluated with Josj Borders in palliative care. It was rec: by dr Janese Banks that patient be offered hospice.for his sob and weakness. Josh spoke to patient and family and the conclusion was hospice services. Sent referral in via fax. Josh called Hospice his self and asked them to open tom. Family and pt. Aware

## 2018-07-27 NOTE — Telephone Encounter (Signed)
Patients daughter called to report that the patient was feeling very short of breath this morning. She wanted to know if he could be seen earlier than his 2:30 appointment. Symptom Management will see patient at 1:30PM family notified appointment change.

## 2018-07-27 NOTE — Telephone Encounter (Signed)
I got a call from the wife that he is sob on exertion with oxygen, he was weak., she was worried that he would not be well enough to go to Dr. Patsey Berthold appt   On 2/25. I told her that I will send message to Janese Banks and let her know. Dr. Janese Banks told me to have him see Josh today. Merrily Pew has several people this am and then in afternoon , the soonest appt was 2:30 and that is when I told wife that 2:30 today to see Merrily Pew and can we what patient was like and what to do to make it better.  Wife was agreeable and now I check the in basket and see that daughter called back about his sob and he is now coming to see [facility] at 1:30

## 2018-07-27 NOTE — Progress Notes (Signed)
Pt was on 3 liters of oxygen and after getting up out of wheel chair-he did not seem to be sob but his sats was 86% on 3 liters and p-144. I switched him to a tank here due to wife feels like she does not want it to run out. After 2 min. His sats came up to 92% and p-114 and while I was in the room his p-97. The family states that he gets anxious getting up and moving around due to always feels like he is air hungry. He eats but not like he use to according to wife

## 2018-08-02 ENCOUNTER — Inpatient Hospital Stay: Payer: Medicare PPO | Admitting: Pulmonary Disease

## 2018-08-05 ENCOUNTER — Ambulatory Visit: Payer: Medicare PPO | Attending: Radiation Oncology | Admitting: Radiation Oncology

## 2018-08-05 ENCOUNTER — Inpatient Hospital Stay: Admission: RE | Admit: 2018-08-05 | Payer: Medicare PPO | Source: Ambulatory Visit | Admitting: Radiation Oncology

## 2018-08-12 ENCOUNTER — Telehealth: Payer: Self-pay | Admitting: *Deleted

## 2018-08-12 ENCOUNTER — Encounter: Payer: Self-pay | Admitting: Oncology

## 2018-08-22 ENCOUNTER — Other Ambulatory Visit: Payer: Medicare PPO

## 2018-08-22 ENCOUNTER — Ambulatory Visit: Payer: Medicare PPO | Admitting: Oncology

## 2018-08-22 ENCOUNTER — Encounter: Payer: Medicare PPO | Admitting: Hospice and Palliative Medicine

## 2018-09-07 NOTE — Telephone Encounter (Signed)
Hospice nurse called to report that patient expired today at 10:52 AM

## 2018-09-07 DEATH — deceased

## 2020-09-23 IMAGING — CT CT ANGIO NECK
1 of 11 series · 6 of 34 positions shown · IV contrast (iopamidol)
Comparison: Brain MRI from yesterday.

CLINICAL DATA: Stroke follow-up

EXAM:
CT ANGIOGRAPHY HEAD AND NECK
TECHNIQUE: Multidetector CT imaging of the head and neck was performed using
the standard protocol during bolus administration of intravenous
contrast. Multiplanar CT image reconstructions and MIPs were
obtained to evaluate the vascular anatomy. Carotid stenosis
measurements (when applicable) are obtained utilizing NASCET
criteria, using the distal internal carotid diameter as the
denominator.
CONTRAST:  75mL [id number] IOPAMIDOL ([id number]) INJECTION 76%

[Series 10: ax thin · axial · 0.36mm/px · z∈[-369,-122]mm · 6 of 359 slices shown]
[im 52/359  soft-tissue]
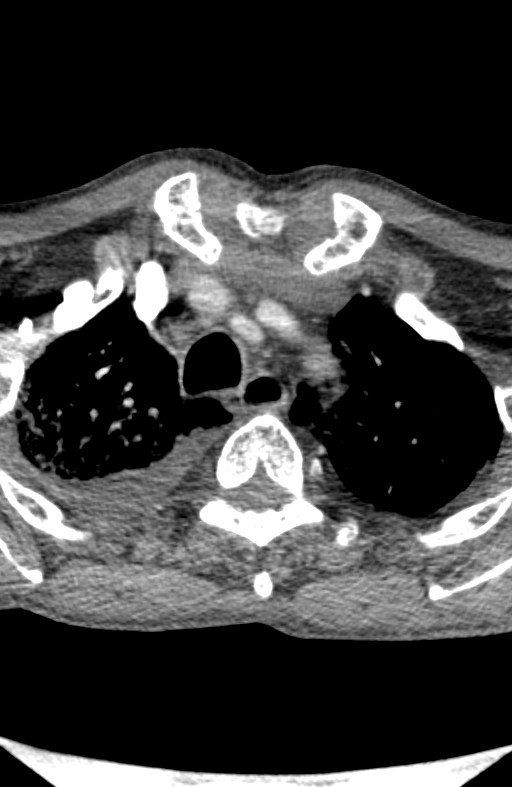
[im 103/359  bone]
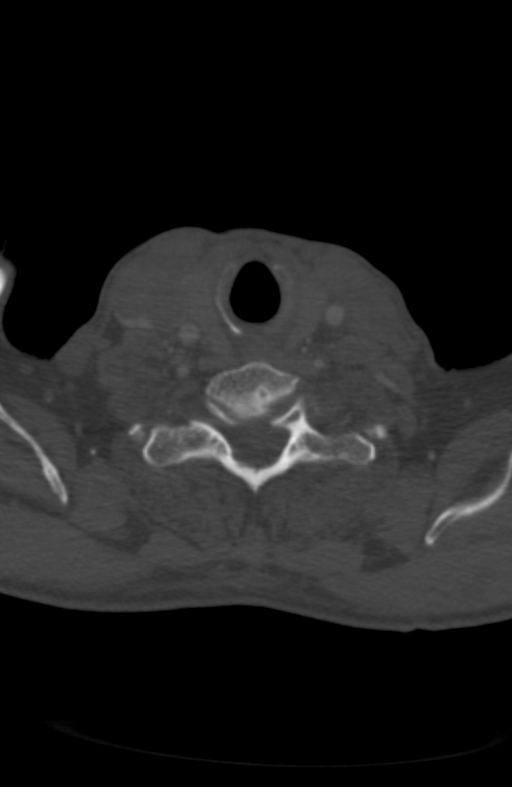
[im 154/359  soft-tissue]
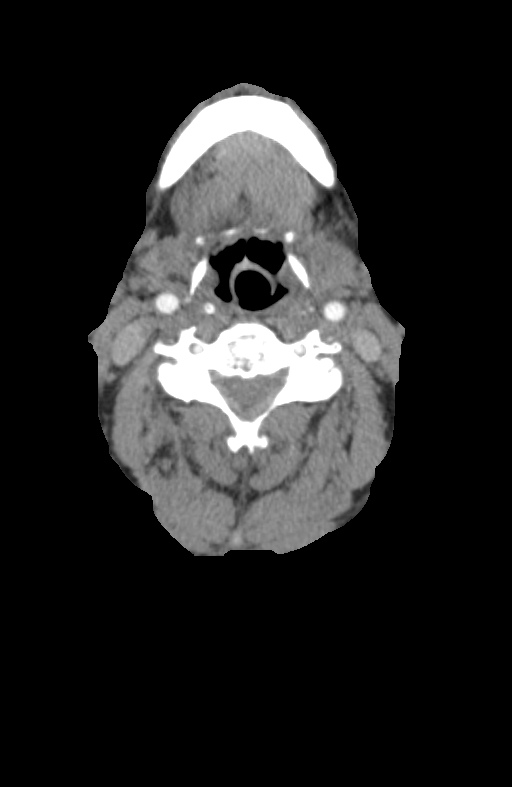
[im 205/359  bone]
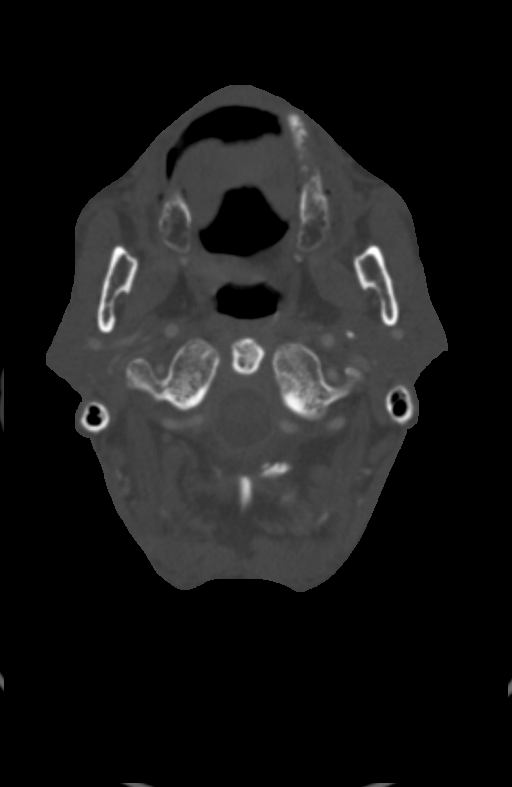
[im 256/359  soft-tissue]
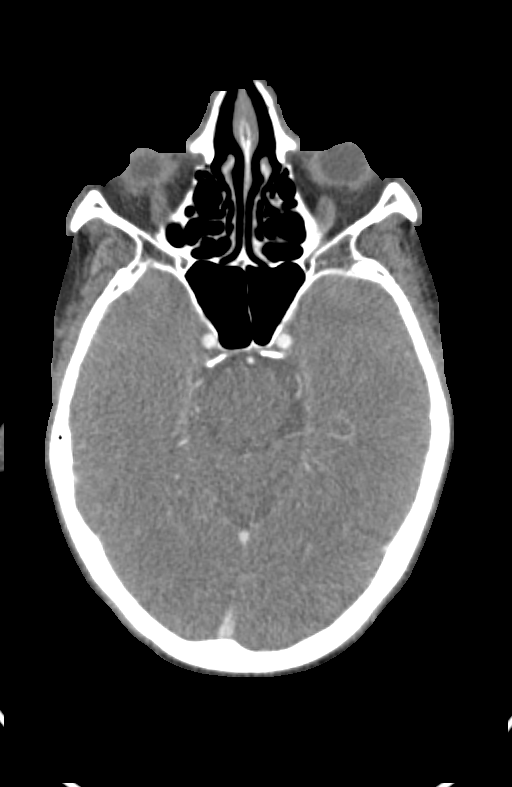
[im 307/359  bone]
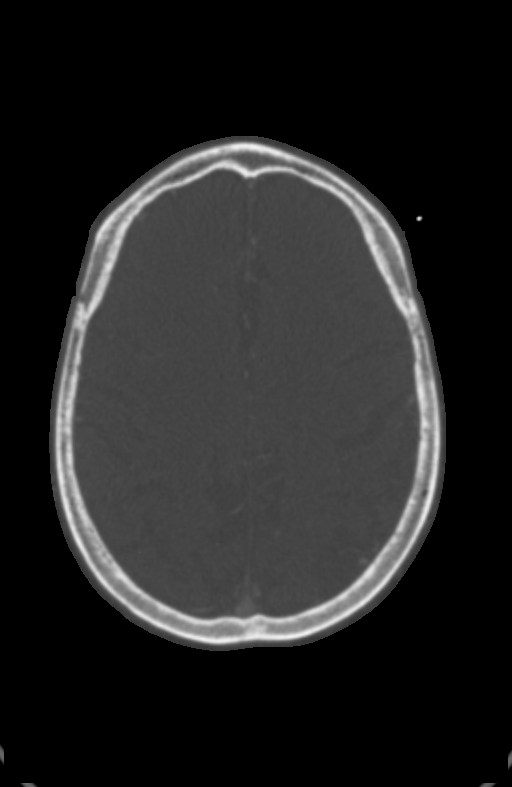

[6 of 34 positions shown; findings below may reference images not displayed]

FINDINGS: CT HEAD FINDINGS

Brain: Acute infarcts are not clearly visible. No evidence of
progressive infarct. No hemorrhage or hydrocephalus.

Vascular: See below

Skull: Negative

Sinuses: Negative

Orbits: Negative

Review of the MIP images confirms the above findings

CTA NECK FINDINGS

Aortic arch: Mild atherosclerotic plaque.  Three vessel branching.

Right carotid system: No notable atheromatous changes. No beading or
dissection.

Left carotid system: The vessels are smooth and widely patent. No
notable atheromatous changes.

Vertebral arteries: No proximal subclavian stenosis or significant
atherosclerosis. There is mild to moderate narrowing at the origin
of the right vertebral artery. Otherwise the vertebral arteries are
smooth and widely patent.

Skeleton: No acute or aggressive finding. Chronic superior endplate
fractures of T1, T2, and T3. Osteopenia and cervical disc
degeneration.

Other neck: No significant finding

Upper chest: Known intrathoracic malignancy with subcarinal
adenopathy and a right pleural effusion that has developed since
prior. Emphysema.

Review of the MIP images confirms the above findings

CTA HEAD FINDINGS

Anterior circulation: Moderate right M2 branch narrowing, presumably
atheromatous. No branch occlusion or aneurysm.

Posterior circulation: Codominant vertebral arteries. Hypoplastic
right P1 segment. The vertebral and basilar arteries are smooth and
diffusely patent. No branch occlusion or flow limiting stenosis.

Venous sinuses: Patent

Anatomic variants: As above

Delayed phase: No abnormal intracranial enhancement

Review of the MIP images confirms the above findings
IMPRESSION: 1. No emergent finding.
2. Mild for age atherosclerosis in the neck without flow limiting
stenosis or evidence embolic source.
3. Known lung cancer. There is a right pleural effusion that was not
seen on 03/23/2018 chest CT.
